# Patient Record
Sex: Female | Born: 2000 | Hispanic: No | Marital: Single | Smoking: Never smoker
Health system: Southern US, Community
[De-identification: ages and names within clinical notes are randomized; demographics above are authoritative.]

## PROBLEM LIST (undated history)

## (undated) ENCOUNTER — Inpatient Hospital Stay (HOSPITAL_COMMUNITY): Payer: Self-pay

## (undated) ENCOUNTER — Ambulatory Visit: Admission: EM | Payer: Medicaid Other

## (undated) ENCOUNTER — Emergency Department (HOSPITAL_COMMUNITY): Admission: EM | Payer: Medicaid Other | Source: Home / Self Care

## (undated) ENCOUNTER — Emergency Department (HOSPITAL_COMMUNITY): Admission: EM | Payer: Medicaid Other

## (undated) DIAGNOSIS — D649 Anemia, unspecified: Secondary | ICD-10-CM

## (undated) DIAGNOSIS — R7303 Prediabetes: Secondary | ICD-10-CM

## (undated) DIAGNOSIS — I10 Essential (primary) hypertension: Secondary | ICD-10-CM

## (undated) DIAGNOSIS — R160 Hepatomegaly, not elsewhere classified: Secondary | ICD-10-CM

## (undated) DIAGNOSIS — M069 Rheumatoid arthritis, unspecified: Secondary | ICD-10-CM

## (undated) DIAGNOSIS — N200 Calculus of kidney: Secondary | ICD-10-CM

## (undated) HISTORY — DX: Prediabetes: R73.03

## (undated) HISTORY — PX: NO PAST SURGERIES: SHX2092

---
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
## 2018-04-18 ENCOUNTER — Other Ambulatory Visit: Payer: Self-pay

## 2018-04-18 ENCOUNTER — Emergency Department
Admission: EM | Admit: 2018-04-18 | Discharge: 2018-04-18 | Disposition: A | Discharge status: Home / Self Care | Payer: Medicaid Other | Attending: Student in an Organized Health Care Education/Training Program | Admitting: Student in an Organized Health Care Education/Training Program

## 2018-04-18 DIAGNOSIS — J029 Acute pharyngitis, unspecified: Secondary | ICD-10-CM | POA: Insufficient documentation

## 2018-04-18 DIAGNOSIS — F1210 Cannabis abuse, uncomplicated: Secondary | ICD-10-CM | POA: Diagnosis not present

## 2018-04-18 DIAGNOSIS — R070 Pain in throat: Secondary | ICD-10-CM | POA: Diagnosis present

## 2018-04-18 LAB — GROUP A STREP BY PCR: GROUP A STREP BY PCR: NOT DETECTED

## 2018-04-18 MED ORDER — AMOXICILLIN 500 MG PO CAPS: 500.0000 mg | ORAL_CAPSULE | Freq: Three times a day (TID) | ORAL | 0 refills | Status: AC

## 2018-04-18 MED ORDER — AMOXICILLIN 500 MG PO CAPS
500.0000 mg | ORAL_CAPSULE | Freq: Once | ORAL | Status: AC
  Administered 2018-04-18: 500 mg via ORAL
  Filled 2018-04-18: qty 1

## 2018-04-18 MED ORDER — DEXAMETHASONE 4 MG PO TABS
10.0000 mg | ORAL_TABLET | Freq: Once | ORAL | Status: AC
  Administered 2018-04-18: 10 mg via ORAL
  Filled 2018-04-18: qty 2.5

## 2018-04-18 NOTE — ED Provider Notes (Signed)
[facility] Emergency Department Provider Note    First MD Initiated Contact with Patient 04/18/18 [phone]     (approximate)  I have reviewed the triage vital signs and the nursing notes.   HISTORY  Chief Complaint Sore Throat    HPI Bonnie Booth is a 17 y.o. female presents the ER for 3 days of sore throat.  Patient has not had any cough or measured fevers.  States it is achy sore throat and very uncomfortable when eating any food or liquids.  No sick contacts.  Denies any chest pain nausea vomiting or diarrhea.    Past Medical History:  Diagnosis Date  . Rheumatoid arthritis (HCC)   . Rheumatoid arthritis (HCC)    No family history on file. No past surgical history on file. There are no active problems to display for this patient.     Prior to Admission medications   Medication Sig Start Date End Date Taking? Authorizing Provider  albuterol (PROVENTIL HFA;VENTOLIN HFA) 108 (90 Base) MCG/ACT inhaler Inhale 1-2 puffs into the lungs every 6 (six) hours as needed for wheezing or shortness of breath (cough). Patient not taking: Reported on 10/30/2017 06/07/15   Street, [place], PA-C  amoxicillin (AMOXIL) 500 MG capsule Take 1 capsule (500 mg total) by mouth 3 (three) times daily for 5 days. 04/18/18 04/23/18  Willy Eddy, MD  doxycycline (VIBRAMYCIN) 100 MG capsule Take 1 capsule (100 mg total) by mouth 2 (two) times daily. 12/21/17   Derwood Kaplan, MD  famotidine (PEPCID) 20 MG tablet Take 1 tablet (20 mg total) by mouth 2 (two) times daily. Patient not taking: Reported on 12/21/2017 10/30/17   Jacalyn Lefevre, MD  ibuprofen (ADVIL,MOTRIN) 200 MG tablet Take 200 mg by mouth every 6 (six) hours as needed for mild pain.    [provider]  nitrofurantoin, macrocrystal-monohydrate, (MACROBID) 100 MG capsule Take 1 capsule (100 mg total) by mouth 2 (two) times daily. 12/21/17   Derwood Kaplan, MD  Spacer/Aero-Holding Chambers (AEROCHAMBER PLUS  WITH MASK) inhaler Use as instructed 06/07/15   Street, [place], PA-C    Allergies Apple; Bee venom; Cinnamon; Nurse, learning disability agent]; and Tomato    Social History Social History   Tobacco Use  . Smoking status: Never Smoker  . Smokeless tobacco: Never Used  Substance Use Topics  . Alcohol use: No  . Drug use: Yes    Types: Marijuana    Comment: 3-4 days a week    Review of Systems Patient denies headaches, rhinorrhea, blurry vision, numbness, shortness of breath, chest pain, edema, cough, abdominal pain, nausea, vomiting, diarrhea, dysuria, fevers, rashes or hallucinations unless otherwise stated above in HPI. ____________________________________________   PHYSICAL EXAM:  VITAL SIGNS: Vitals:   04/18/18 0139  BP: 119/79  Pulse: 87  Resp: 18  Temp: 98.7 F (37.1 C)  SpO2: 98%    Constitutional: Alert and oriented.  Eyes: Conjunctivae are normal.  Head: Atraumatic. Nose: No congestion/rhinnorhea. Mouth/Throat: Mucous membranes are moist.   Neck: No stridor. Painless ROM.  Cardiovascular: Normal rate, regular rhythm. Grossly normal heart sounds.  Good peripheral circulation. Respiratory: Normal respiratory effort.  No retractions. Lungs CTAB. Gastrointestinal: Soft and nontender. No distention. No abdominal bruits. No CVA tenderness. Genitourinary:  Musculoskeletal: No lower extremity tenderness nor edema.  No joint effusions. Neurologic:  Normal speech and language. No gross focal neurologic deficits are appreciated. No facial droop Skin:  Skin is warm, dry and intact. No rash noted. Psychiatric: Mood and affect are  normal. Speech and behavior are normal.  ____________________________________________   LABS (all labs ordered are listed, but only abnormal results are displayed)  Results for orders placed or performed during the hospital encounter of 04/18/18 (from the past 24 hour(s))  Group A Strep by PCR     Status: None   Collection Time: 04/18/18   1:41 AM  Result Value Ref Range   Group A Strep by PCR NOT DETECTED NOT DETECTED   ____________________________________________ ____________________________________________  RADIOLOGY  ____________________________________________   PROCEDURES  Procedure(s) performed:  Procedures    Critical Care performed: no ____________________________________________   INITIAL IMPRESSION / ASSESSMENT AND PLAN / ED COURSE  Pertinent labs & imaging results that were available during my care of the patient were reviewed by me and considered in my medical decision making (see chart for details).   DDX: pharyngitis, mono, pta,   Bonnie Booth is a 17 y.o. who presents to the ED with 3 days of sore throat. Patient is AFVSS in ED. Exam as above. Given current presentation have considered the above differential. Not consistent with PTA or RPA as no muffled voice, uvula midline, no asymmetry. Bilateral purulent exudates. No neck stiffness.  Exam consistent with acute pharyngitis.  Will give course of antibiotics as well as steroid and discussed conservative management.       As part of my medical decision making, I reviewed the following data within the electronic MEDICAL RECORD NUMBER Nursing notes reviewed and incorporated, Labs reviewed, notes from prior ED visits.   ____________________________________________   FINAL CLINICAL IMPRESSION(S) / ED DIAGNOSES  Final diagnoses:  Acute pharyngitis, unspecified etiology      NEW MEDICATIONS STARTED DURING THIS VISIT:  New Prescriptions   AMOXICILLIN (AMOXIL) 500 MG CAPSULE    Take 1 capsule (500 mg total) by mouth 3 (three) times daily for 5 days.     Note:  This document was prepared using Dragon voice recognition software and may include unintentional dictation errors.    Willy Eddy, MD 04/18/18 [phone]

## 2018-04-18 NOTE — ED Triage Notes (Signed)
Sore throat tonight.

## 2018-08-12 ENCOUNTER — Other Ambulatory Visit: Payer: Self-pay

## 2018-08-12 ENCOUNTER — Emergency Department (HOSPITAL_COMMUNITY)
Admission: EM | Admit: 2018-08-12 | Discharge: 2018-08-12 | Disposition: A | Discharge status: Home / Self Care | Payer: Medicaid Other | Attending: Emergency Medicine | Admitting: Emergency Medicine

## 2018-08-12 DIAGNOSIS — M545 Low back pain, unspecified: Secondary | ICD-10-CM

## 2018-08-12 DIAGNOSIS — R1013 Epigastric pain: Secondary | ICD-10-CM | POA: Diagnosis not present

## 2018-08-12 DIAGNOSIS — Z79899 Other long term (current) drug therapy: Secondary | ICD-10-CM | POA: Diagnosis not present

## 2018-08-12 LAB — COMPREHENSIVE METABOLIC PANEL
ALT: 30 U/L (ref 0–44)
AST: 39 U/L (ref 15–41)
Albumin: 3.8 g/dL (ref 3.5–5.0)
Alkaline Phosphatase: 86 U/L (ref 38–126)
Anion gap: 12 (ref 5–15)
BUN: 13 mg/dL (ref 6–20)
CO2: 24 mmol/L (ref 22–32)
Calcium: 8.9 mg/dL (ref 8.9–10.3)
Chloride: 103 mmol/L (ref 98–111)
Creatinine, Ser: 0.91 mg/dL (ref 0.44–1.00)
GFR calc Af Amer: >60 mL/min (ref >60)
GFR calc non Af Amer: >60 mL/min (ref >60)
Glucose, Bld: 109 mg/dL — ABNORMAL HIGH (ref 70–99)
Potassium: 3.2 mmol/L — ABNORMAL LOW (ref 3.5–5.1)
Sodium: 139 mmol/L (ref 135–145)
Total Bilirubin: 0.5 mg/dL (ref 0.3–1.2)
Total Protein: 8 g/dL (ref 6.5–8.1)

## 2018-08-12 LAB — URINALYSIS, ROUTINE W REFLEX MICROSCOPIC
Bacteria, UA: NONE SEEN
Bilirubin Urine: NEGATIVE
Glucose, UA: NEGATIVE mg/dL
Hgb urine dipstick: NEGATIVE
Ketones, ur: 5 mg/dL — AB
Leukocytes,Ua: NEGATIVE
Nitrite: NEGATIVE
Protein, ur: 30 mg/dL — AB
Specific Gravity, Urine: 1.031 — ABNORMAL HIGH (ref 1.005–1.030)
pH: 5 (ref 5.0–8.0)

## 2018-08-12 LAB — CBC WITH DIFFERENTIAL/PLATELET
Abs Immature Granulocytes: 0.01 10*3/uL (ref 0.00–0.07)
Basophils Absolute: 0 10*3/uL (ref 0.0–0.1)
Basophils Relative: 1 %
Eosinophils Absolute: 0.1 10*3/uL (ref 0.0–0.5)
Eosinophils Relative: 1 %
HCT: 41 % (ref 36.0–46.0)
Hemoglobin: 12.1 g/dL (ref 12.0–15.0)
Immature Granulocytes: 0 %
Lymphocytes Relative: 30 %
Lymphs Abs: 1.7 10*3/uL (ref 0.7–4.0)
MCH: 24.5 pg — ABNORMAL LOW (ref 26.0–34.0)
MCHC: 29.5 g/dL — ABNORMAL LOW (ref 30.0–36.0)
MCV: 83 fL (ref 80.0–100.0)
Monocytes Absolute: 0.3 10*3/uL (ref 0.1–1.0)
Monocytes Relative: 6 %
Neutro Abs: 3.5 10*3/uL (ref 1.7–7.7)
Neutrophils Relative %: 62 %
Platelets: 319 10*3/uL (ref 150–400)
RBC: 4.94 MIL/uL (ref 3.87–5.11)
RDW: 13.3 % (ref 11.5–15.5)
WBC: 5.6 10*3/uL (ref 4.0–10.5)
nRBC: 0 % (ref 0.0–0.2)

## 2018-08-12 LAB — LIPASE, BLOOD: Lipase: 30 U/L (ref 11–51)

## 2018-08-12 LAB — PREGNANCY, URINE: Preg Test, Ur: NEGATIVE

## 2018-08-12 NOTE — ED Provider Notes (Signed)
[facility] EMERGENCY DEPARTMENT Provider Note   CSN: [id number] Arrival date & time: 08/12/18  2051    History   Chief Complaint No chief complaint on file.   HPI Bonnie Booth is a 18 y.o. female who is a very poor historian.  Patient states that she and her boyfriend have been trying to get pregnant.  Lately she has been feeling "off."  She thought it may be because she is pregnant.  She took a negative pregnancy test at home 4 weeks ago.  Patient states that she has been having epigastric abdominal pain.  It seems to be worse after exercise.  No postprandial pain.  No nausea or vomiting.  She has been feeling a little bit lightheaded today.  Because of the home quarantine patient has been walking more frequently and exercising which is new for her.  She denies vaginal discharge, pain with intercourse, frequency, urgency, hematuria.  She is having some low back pain which is worse with movement, better with rest.  She denies red flag symptoms such as saddle anesthesia, bowel or bladder incontinence, weakness of the lower extremities.     HPI  Past Medical History:  Diagnosis Date   Rheumatoid arthritis (HCC)    Rheumatoid arthritis (HCC)     There are no active problems to display for this patient.   No past surgical history on file.   OB History   No obstetric history on file.      Home Medications    Prior to Admission medications   Medication Sig Start Date End Date Taking? Authorizing Provider  albuterol (PROVENTIL HFA;VENTOLIN HFA) 108 (90 Base) MCG/ACT inhaler Inhale 1-2 puffs into the lungs every 6 (six) hours as needed for wheezing or shortness of breath (cough). Patient not taking: Reported on 10/30/2017 06/07/15   Street, [place], PA-C  doxycycline (VIBRAMYCIN) 100 MG capsule Take 1 capsule (100 mg total) by mouth 2 (two) times daily. 12/21/17   Derwood Kaplan, MD  famotidine (PEPCID) 20 MG tablet Take 1 tablet (20 mg total) by mouth 2 (two)  times daily. Patient not taking: Reported on 12/21/2017 10/30/17   Jacalyn Lefevre, MD  ibuprofen (ADVIL,MOTRIN) 200 MG tablet Take 200 mg by mouth every 6 (six) hours as needed for mild pain.    [provider]  nitrofurantoin, macrocrystal-monohydrate, (MACROBID) 100 MG capsule Take 1 capsule (100 mg total) by mouth 2 (two) times daily. 12/21/17   Derwood Kaplan, MD  Spacer/Aero-Holding Chambers (AEROCHAMBER PLUS WITH MASK) inhaler Use as instructed 06/07/15   Street, [place], [place]    Family History No family history on file.  Social History Social History   Tobacco Use   Smoking status: Never Smoker   Smokeless tobacco: Never Used  Substance Use Topics   Alcohol use: No   Drug use: Yes    Types: Marijuana    Comment: 3-4 days a week     Allergies   Apple; Bee venom; Cinnamon; Mango flavor [flavoring agent]; and Tomato   Review of Systems Review of Systems   Ten systems reviewed and are negative for acute change, except as noted in the HPI.   Physical Exam Updated Vital Signs BP 125/74 (BP Location: Left Arm)    Pulse 87    Temp 98.3 F (36.8 C) (Oral)    Resp 18    SpO2 100%   Physical Exam Vitals signs and nursing note reviewed.  Constitutional:      General: She is not in acute  distress.    Appearance: She is well-developed. She is not diaphoretic.  HENT:     Head: Normocephalic and atraumatic.  Eyes:     General: No scleral icterus.    Conjunctiva/sclera: Conjunctivae normal.  Neck:     Musculoskeletal: Normal range of motion.  Cardiovascular:     Rate and Rhythm: Normal rate and regular rhythm.     Heart sounds: Normal heart sounds. No murmur. No friction rub. No gallop.   Pulmonary:     Effort: Pulmonary effort is normal. No respiratory distress.     Breath sounds: Normal breath sounds.  Abdominal:     General: Bowel sounds are normal. There is no distension.     Palpations: Abdomen is soft. There is no mass.     Tenderness: There is no  abdominal tenderness. There is no right CVA tenderness, left CVA tenderness or guarding.  Musculoskeletal:     Comments: No midline spinal tenderness.  Pain with twisting, flexion extension of the lumbar spine.  Patient is full.  Normal strength in lower extremities.  Skin:    General: Skin is warm and dry.  Neurological:     Mental Status: She is alert and oriented to person, place, and time.  Psychiatric:        Behavior: Behavior normal.      ED Treatments / Results  Labs (all labs ordered are listed, but only abnormal results are displayed) Labs Reviewed - No data to display  EKG None  Radiology No results found.  Procedures Procedures (including critical care time)  Medications Ordered in ED Medications - No data to display   Initial Impression / Assessment and Plan / ED Course  I have reviewed the triage vital signs and the nursing notes.  Pertinent labs & imaging results that were available during my care of the patient were reviewed by me and considered in my medical decision making (see chart for details).      Patient with vague abdominal symptoms and lightheadedness.  Mild hypokalemia on review of labs.  The elevated glucose.  Urine without signs of infection.  Patient is not pregnant.  Patient is advised to follow-up with her primary care physician.  She appears appropriate for discharge at this time Patient with back pain.  No neurological deficits and normal neuro exam.  Patient can walk but states is painful.  No loss of bowel or bladder control.  No concern for cauda equina.  No fever, night sweats, weight loss, h/o cancer, IVDU.  RICE protocol and pain medicine indicated and discussed with patient.      Final Clinical Impressions(s) / ED Diagnoses   Final diagnoses:  None    ED Discharge Orders    None       Arthor Captain, PA-C 08/12/18 2300    Wynetta Fines, MD 08/12/18 2304

## 2018-08-12 NOTE — Discharge Instructions (Addendum)
You are not pregnant. Your labs are normal Use tylenol or ibuprofen as directed on the bottle for pain. Start taking a prenatal vitamin if you are trying to get pregnant. SEEK IMMEDIATE MEDICAL ATTENTION IF: New numbness, tingling, weakness, or problem with the use of your arms or legs.  Severe back pain not relieved with medications.  Change in bowel or bladder control.  Increasing pain in any areas of the body (such as chest or abdominal pain).  Shortness of breath, dizziness or fainting.  Nausea (feeling sick to your stomach), vomiting, fever, or sweats. Abdominal (belly) pain can be caused by many things. Your caregiver performed an examination and possibly ordered blood/urine tests and imaging (CT scan, x-rays, ultrasound). Many cases can be observed and treated at home after initial evaluation in the emergency department. Even though you are being discharged home, abdominal pain can be unpredictable. Therefore, you need a repeated exam if your pain does not resolve, returns, or worsens. Most patients with abdominal pain don't have to be admitted to the hospital or have surgery, but serious problems like appendicitis and gallbladder attacks can start out as nonspecific pain. Many abdominal conditions cannot be diagnosed in one visit, so follow-up evaluations are very important. SEEK IMMEDIATE MEDICAL ATTENTION IF: The pain does not go away or becomes severe.  A temperature above 101 develops.  Repeated vomiting occurs (multiple episodes).  The pain becomes localized to portions of the abdomen. The right side could possibly be appendicitis. In an adult, the left lower portion of the abdomen could be colitis or diverticulitis.  Blood is being passed in stools or vomit (bright red or black tarry stools).  Return also if you develop chest pain, difficulty breathing, dizziness or fainting, or become confused, poorly responsive, or inconsolable (young children).

## 2018-08-12 NOTE — ED Notes (Signed)
Pt alert and oriented x4. Skin warm and dry. Respirations equal and unlabored. s1 and s2 heart sounds audible. Pt states last period was exactly a month ago. Denies n/v/. Denies any vaginal discharge.

## 2018-08-12 NOTE — ED Triage Notes (Signed)
Pt states has abdominal pain and believes she is pregnant. Last menstrual was last month.

## 2018-09-20 ENCOUNTER — Other Ambulatory Visit: Payer: Self-pay

## 2018-09-20 ENCOUNTER — Emergency Department (HOSPITAL_COMMUNITY): Payer: Medicaid Other

## 2018-09-20 ENCOUNTER — Emergency Department (HOSPITAL_COMMUNITY)
Admission: EM | Admit: 2018-09-20 | Discharge: 2018-09-20 | Disposition: A | Discharge status: Home / Self Care | Payer: Medicaid Other | Attending: Emergency Medicine | Admitting: Emergency Medicine

## 2018-09-20 ENCOUNTER — Encounter (HOSPITAL_COMMUNITY): Payer: Self-pay | Admitting: Emergency Medicine

## 2018-09-20 DIAGNOSIS — Z3A01 Less than 8 weeks gestation of pregnancy: Secondary | ICD-10-CM | POA: Diagnosis not present

## 2018-09-20 DIAGNOSIS — O26899 Other specified pregnancy related conditions, unspecified trimester: Secondary | ICD-10-CM

## 2018-09-20 DIAGNOSIS — R102 Pelvic and perineal pain: Secondary | ICD-10-CM | POA: Diagnosis not present

## 2018-09-20 DIAGNOSIS — O26891 Other specified pregnancy related conditions, first trimester: Secondary | ICD-10-CM | POA: Diagnosis present

## 2018-09-20 LAB — COMPREHENSIVE METABOLIC PANEL
ALT: 26 U/L (ref 0–44)
AST: 24 U/L (ref 15–41)
Albumin: 3.6 g/dL (ref 3.5–5.0)
Alkaline Phosphatase: 90 U/L (ref 38–126)
Anion gap: 6 (ref 5–15)
BUN: 15 mg/dL (ref 6–20)
CO2: 27 mmol/L (ref 22–32)
Calcium: 8.7 mg/dL — ABNORMAL LOW (ref 8.9–10.3)
Chloride: 105 mmol/L (ref 98–111)
Creatinine, Ser: 0.82 mg/dL (ref 0.44–1.00)
GFR calc Af Amer: >60 mL/min (ref >60)
GFR calc non Af Amer: >60 mL/min (ref >60)
Glucose, Bld: 90 mg/dL (ref 70–99)
Potassium: 3.7 mmol/L (ref 3.5–5.1)
Sodium: 138 mmol/L (ref 135–145)
Total Bilirubin: 0.3 mg/dL (ref 0.3–1.2)
Total Protein: 7.4 g/dL (ref 6.5–8.1)

## 2018-09-20 LAB — URINALYSIS, ROUTINE W REFLEX MICROSCOPIC
Bacteria, UA: NONE SEEN
Bilirubin Urine: NEGATIVE
Glucose, UA: NEGATIVE mg/dL
Hgb urine dipstick: NEGATIVE
Ketones, ur: NEGATIVE mg/dL
Nitrite: NEGATIVE
Protein, ur: NEGATIVE mg/dL
Specific Gravity, Urine: 1.021 (ref 1.005–1.030)
pH: 6 (ref 5.0–8.0)

## 2018-09-20 LAB — CBC
HCT: 38.7 % (ref 36.0–46.0)
Hemoglobin: 11.8 g/dL — ABNORMAL LOW (ref 12.0–15.0)
MCH: 25.5 pg — ABNORMAL LOW (ref 26.0–34.0)
MCHC: 30.5 g/dL (ref 30.0–36.0)
MCV: 83.8 fL (ref 80.0–100.0)
Platelets: 320 10*3/uL (ref 150–400)
RBC: 4.62 MIL/uL (ref 3.87–5.11)
RDW: 14.1 % (ref 11.5–15.5)
WBC: 8.6 10*3/uL (ref 4.0–10.5)
nRBC: 0 % (ref 0.0–0.2)

## 2018-09-20 LAB — LIPASE, BLOOD: Lipase: 27 U/L (ref 11–51)

## 2018-09-20 LAB — I-STAT BETA HCG BLOOD, ED (MC, WL, AP ONLY): I-stat hCG, quantitative: 1396.1 m[IU]/mL — ABNORMAL HIGH (ref <5)

## 2018-09-20 LAB — ABO/RH: ABO/RH(D): O POS

## 2018-09-20 MED ORDER — SODIUM CHLORIDE 0.9 % IV BOLUS
1000.0000 mL | Freq: Once | INTRAVENOUS | Status: AC
  Administered 2018-09-20: 1000 mL via INTRAVENOUS

## 2018-09-20 NOTE — ED Provider Notes (Signed)
[facility]-EMERGENCY DEPT Provider Note   CSN: [id number] Arrival date & time: 09/20/18  1407    History   Chief Complaint Chief Complaint  Patient presents with  . Abdominal Pain    HPI Bonnie Booth is a 18 y.o. female 583P0, 5718w3d by lmp  with a past medical history of rheumatoid arthritis who presents today for evaluation of midline pelvic pain.  She reports that she has taken a pregnancy test at home that was positive.  She is having intermittent episodes of under 1 minute of midline lower pelvic pain.  She denies any vaginal spotting or bleeding.  No vaginal discharge.  She denies any concern for sexually transmitted infections.    No fevers.  No urinary symptoms.      HPI  Past Medical History:  Diagnosis Date  . Rheumatoid arthritis (HCC)   . Rheumatoid arthritis (HCC)     There are no active problems to display for this patient.   History reviewed. No pertinent surgical history.   OB History    Gravida  3   Para  0   Term      Preterm      AB  2   Living        SAB  2   TAB      Ectopic      Multiple      Live Births               Home Medications    Prior to Admission medications   Medication Sig Start Date End Date Taking? Authorizing Provider  albuterol (PROVENTIL HFA;VENTOLIN HFA) 108 (90 Base) MCG/ACT inhaler Inhale 1-2 puffs into the lungs every 6 (six) hours as needed for wheezing or shortness of breath (cough). 06/07/15   Street, [place], PA-C  doxycycline (VIBRAMYCIN) 100 MG capsule Take 1 capsule (100 mg total) by mouth 2 (two) times daily. Patient not taking: Reported on 08/12/2018 12/21/17   Derwood KaplanNanavati, Ankit, MD  EPINEPHrine 0.3 mg/0.3 mL IJ SOAJ injection Inject 0.3 mg into the muscle once as needed for anaphylaxis.  02/07/18   [provider]  famotidine (PEPCID) 20 MG tablet Take 1 tablet (20 mg total) by mouth 2 (two) times daily. Patient not taking: Reported on 08/12/2018 10/30/17   Jacalyn LefevreHaviland,  Julie, MD  ibuprofen (ADVIL,MOTRIN) 200 MG tablet Take 200-400 mg by mouth every 6 (six) hours as needed (for pain or headaches).     [provider]  nitrofurantoin, macrocrystal-monohydrate, (MACROBID) 100 MG capsule Take 1 capsule (100 mg total) by mouth 2 (two) times daily. Patient not taking: Reported on 08/12/2018 12/21/17   Derwood KaplanNanavati, Ankit, MD  Spacer/Aero-Holding Chambers (AEROCHAMBER PLUS WITH MASK) inhaler Use as instructed 06/07/15   Street, [place]cedes, [place]-C    Family History No family history on file.  Social History Social History   Tobacco Use  . Smoking status: Never Smoker  . Smokeless tobacco: Never Used  Substance Use Topics  . Alcohol use: No  . Drug use: Yes    Types: Marijuana    Comment: 3-4 days a week     Allergies   Apple; Bee venom; Cinnamon; Mango flavor [flavoring agent]; Peach [prunus persica]; Pineapple; and Tomato   Review of Systems Review of Systems  Constitutional: Negative for chills and fever.  Respiratory: Negative for cough and shortness of breath.   Cardiovascular: Negative for chest pain.  Gastrointestinal: Negative for diarrhea, nausea and vomiting.  Genitourinary: Positive for pelvic pain. Negative  for dysuria, flank pain, hematuria and urgency.  Musculoskeletal: Negative for back pain and neck pain.  Neurological: Negative for weakness and headaches.  All other systems reviewed and are negative.    Physical Exam Updated Vital Signs BP 98/87 (BP Location: Left Arm)   Pulse (!) 106   Temp 99.2 F (37.3 C) (Oral)   Resp 17   LMP 08/20/2018   SpO2 100%   Physical Exam Vitals signs and nursing note reviewed.  Constitutional:      General: She is not in acute distress.    Appearance: She is well-developed. She is not diaphoretic.  HENT:     Head: Normocephalic and atraumatic.  Eyes:     General: No scleral icterus.       Right eye: No discharge.        Left eye: No discharge.     Conjunctiva/sclera: Conjunctivae  normal.  Neck:     Musculoskeletal: Normal range of motion.  Cardiovascular:     Rate and Rhythm: Normal rate and regular rhythm.     Heart sounds: Normal heart sounds.  Pulmonary:     Effort: Pulmonary effort is normal. No respiratory distress.     Breath sounds: Normal breath sounds. No stridor.  Abdominal:     General: There is no distension.     Tenderness: There is no abdominal tenderness.  Genitourinary:    Comments: Patient refused.  Musculoskeletal:        General: No deformity.  Skin:    General: Skin is warm and dry.  Neurological:     General: No focal deficit present.     Mental Status: She is alert.     Motor: No abnormal muscle tone.  Psychiatric:        Mood and Affect: Mood normal.        Behavior: Behavior normal.       ED Treatments / Results  Labs (all labs ordered are listed, but only abnormal results are displayed) Labs Reviewed  COMPREHENSIVE METABOLIC PANEL - Abnormal; Notable for the following components:      Result Value   Calcium 8.7 (*)    All other components within normal limits  CBC - Abnormal; Notable for the following components:   Hemoglobin 11.8 (*)    MCH 25.5 (*)    All other components within normal limits  URINALYSIS, ROUTINE W REFLEX MICROSCOPIC - Abnormal; Notable for the following components:   Leukocytes,Ua TRACE (*)    All other components within normal limits  I-STAT BETA HCG BLOOD, ED ([facility], [facility], [facility] ONLY) - Abnormal; Notable for the following components:   I-stat hCG, quantitative 1,396.1 (*)    All other components within normal limits  LIPASE, BLOOD  ABO/RH    EKG None  Radiology US Ob Comp < 14 Wks  Result Date: 09/20/2018 CLINICAL DATA:  Pelvic pain with positive beta HCG EXAM: OBSTETRIC <14 WK [place] AND TRANSVAGINAL OB US TECHNIQUE: Both transabdominal and transvaginal ultrasound examinations were performed for complete evaluation of the gestation as well as the maternal uterus, adnexal regions, and pelvic  cul-de-sac. Transvaginal technique was performed to assess early pregnancy. COMPARISON:  None. FINDINGS: Intrauterine gestational sac: Visualized Yolk sac:  Not visualized Embryo:  Not visualized Cardiac Activity: Not visualized MSD: 4 mm   5 w   1 d Subchorionic hemorrhage:  None visualized. Maternal uterus/adnexae: Cervical os is closed. Right ovary measures 4.1 x 2.4 x 2.2 cm. Left ovary measures 2.4 x 1.5 x 2.5 cm.  No free fluid evident. IMPRESSION: Probable early intrauterine gestational sac, but no yolk sac, fetal pole, or cardiac activity yet visualized. Recommend follow-up quantitative B-HCG levels and follow-up US in 14 days to assess viability. This recommendation follows SRU consensus guidelines: Diagnostic Criteria for Nonviable Pregnancy Early in the First Trimester. Malva Limes Engl J Med 2013; 161:0960-45; 369:1443-51. Based on apparent gestational sac size, estimated gestational age is 5 weeks. Study otherwise unremarkable. Electronically Signed   By: Bretta BangWilliam  Woodruff III M.D.   On: 09/20/2018 18:03   [place] Ob Transvaginal  Result Date: 09/20/2018 CLINICAL DATA:  Pelvic pain with positive beta HCG EXAM: OBSTETRIC <14 WK US AND TRANSVAGINAL OB US TECHNIQUE: Both transabdominal and transvaginal ultrasound examinations were performed for complete evaluation of the gestation as well as the maternal uterus, adnexal regions, and pelvic cul-de-sac. Transvaginal technique was performed to assess early pregnancy. COMPARISON:  None. FINDINGS: Intrauterine gestational sac: Visualized Yolk sac:  Not visualized Embryo:  Not visualized Cardiac Activity: Not visualized MSD: 4 mm   5 w   1 d Subchorionic hemorrhage:  None visualized. Maternal uterus/adnexae: Cervical os is closed. Right ovary measures 4.1 x 2.4 x 2.2 cm. Left ovary measures 2.4 x 1.5 x 2.5 cm. No free fluid evident. IMPRESSION: Probable early intrauterine gestational sac, but no yolk sac, fetal pole, or cardiac activity yet visualized. Recommend follow-up quantitative  B-HCG levels and follow-up US in 14 days to assess viability. This recommendation follows SRU consensus guidelines: Diagnostic Criteria for Nonviable Pregnancy Early in the First Trimester. Malva Limes Engl J Med 2013; 409:8119-14; 369:1443-51. Based on apparent gestational sac size, estimated gestational age is 5 weeks. Study otherwise unremarkable. Electronically Signed   By: Bretta BangWilliam  Woodruff III M.D.   On: 09/20/2018 18:03    Procedures Procedures (including critical care time)  Medications Ordered in ED Medications  sodium chloride 0.9 % bolus 1,000 mL (0 mLs Intravenous Stopped 09/20/18 1759)     Initial Impression / Assessment and Plan / ED Course  I have reviewed the triage vital signs and the nursing notes.  Pertinent labs & imaging results that were available during my care of the patient were reviewed by me and considered in my medical decision making (see chart for details).       Presents today for evaluation of intermittent pain during pregnancy.  She recently found out that she was pregnant, however is not had any imaging yet for prenatal appointments.  Her urine did not have clear evidence of infection and she is not having symptoms.  hCG was elevated at 1300.  Blood type is O+.  She is slightly anemic with a hemoglobin of 11.8, however does not have other significant electrolyte or hematologic abnormalities.  Based on her intermittent pain with unknown location of pregnancy ultrasound was obtained showing a probable intrauterine gestation sac without visualized fetus her heart rate, suspect that this is too early for fetal visualization.  Recommended follow-up with OB in 2 days for repeat hCG and evaluation.  Recommended that if she need additional emergency medical care for her pregnancy that [facility].  Return precautions were discussed with patient who states their understanding.  At the time of discharge patient denied any unaddressed complaints or concerns.  Patient is  agreeable for discharge home.   Final Clinical Impressions(s) / ED Diagnoses   Final diagnoses:  Pelvic pain during pregnancy    ED Discharge Orders    None       Cristina GongHammond, Malaya Cagley W, [place]-C 09/20/18  2300    Rolan BuccoBelfi, Melanie, MD 09/20/18 [phone]

## 2018-09-20 NOTE — ED Triage Notes (Signed)
Pt reports she recently found out she was pregnant but unsure how far along she is. C/o lower abd pains that are intermittent over the past couple days.

## 2018-09-20 NOTE — Discharge Instructions (Signed)
Please start taking a prenatal vitamin.  Please do not drink any alcohol.  Please schedule a follow-up appointment with OB/GYN.  I would recommend that you get a repeat evaluation in 2 days.

## 2018-09-20 NOTE — ED Notes (Signed)
US at bedside

## 2018-10-11 ENCOUNTER — Telehealth (INDEPENDENT_AMBULATORY_CARE_PROVIDER_SITE_OTHER): Payer: Self-pay | Admitting: Family Medicine

## 2018-10-11 DIAGNOSIS — Z3201 Encounter for pregnancy test, result positive: Secondary | ICD-10-CM

## 2018-10-11 NOTE — Telephone Encounter (Signed)
Patient called in, she was told back in May to follow up with a OBGYN for repeat hCG and evaluation, patient state she did not do that and want to know if she still need that done.

## 2018-10-12 ENCOUNTER — Telehealth: Payer: Self-pay | Admitting: Family Medicine

## 2018-10-12 NOTE — Telephone Encounter (Signed)
Called and spoke with pt. She states she feels pretty good. Pt states she is taking Prenatal Gummies.   EDD 05/22/19 based on [place] on 09/20/2018 with Gestation of 5 weeks 1 day.   Pt reports some mild pelvic pain that is intermittent. She reports the pain is not severe and she is not having bleeding.   Discussed with Pt that the front office will be calling her to set up her OB intake with a nurse and then an appt with a provider. Provider will determine if more labwork or follow up US is needed. Pt voiced understanding.

## 2018-10-12 NOTE — Telephone Encounter (Signed)
Patient was called and instructed about her visit for 10/13/2018. MyChart app was downloaded.

## 2018-10-13 ENCOUNTER — Telehealth (INDEPENDENT_AMBULATORY_CARE_PROVIDER_SITE_OTHER): Payer: Medicaid Other | Admitting: *Deleted

## 2018-10-13 ENCOUNTER — Other Ambulatory Visit: Payer: Self-pay

## 2018-10-13 DIAGNOSIS — O3680X0 Pregnancy with inconclusive fetal viability, not applicable or unspecified: Secondary | ICD-10-CM

## 2018-10-13 NOTE — Progress Notes (Signed)
I connected with  Bonnie Booth on 10/13/18 at 10:15 AM EDT by telephone and verified that I am speaking with the correct person using two identifiers.   I discussed the limitations, risks, security and privacy concerns of performing an evaluation and management service by telephone and virtually and the availability of in person appointments. I also discussed with the patient that there may be a patient responsible charge related to this service. The patient expressed understanding and agreed to proceed.  Per review of chart found that patient has ED visit  09/20/18 and did not have confirmed viable pregnancy, having pelvic pain. Discussed with Dr.Davis and plan she needs to have another viability [place] and then if viable, reschedule new ob intake.   I called [name] and infomed her of new plan. She verifies she has not had any bleeding or severe pain since ED visit. I informed her we have scheduled her for [place] 10/20/18 and then she will come to  office for results. We also discussed if viable - will reschedule new ob intake. I also advised her to go to [facility] if she has severe pain or heavy bleeding. She voices understanding. Linda,RN 10/13/2018  10:36 AM

## 2018-10-20 ENCOUNTER — Ambulatory Visit (HOSPITAL_COMMUNITY)
Admission: RE | Admit: 2018-10-20 | Discharge: 2018-10-20 | Disposition: A | Discharge status: Home / Self Care | Payer: Medicaid Other | Source: Ambulatory Visit | Attending: Obstetrics and Gynecology | Admitting: Obstetrics and Gynecology

## 2018-10-20 ENCOUNTER — Other Ambulatory Visit: Payer: Self-pay

## 2018-10-20 ENCOUNTER — Ambulatory Visit (INDEPENDENT_AMBULATORY_CARE_PROVIDER_SITE_OTHER): Payer: Medicaid Other | Admitting: *Deleted

## 2018-10-20 VITALS — BP 116/67 | HR 90 | Ht 66.0 in | Wt 208.8 lb

## 2018-10-20 DIAGNOSIS — O3680X0 Pregnancy with inconclusive fetal viability, not applicable or unspecified: Secondary | ICD-10-CM

## 2018-10-20 DIAGNOSIS — Z712 Person consulting for explanation of examination or test findings: Secondary | ICD-10-CM

## 2018-10-20 NOTE — Progress Notes (Signed)
Pt presents to clinic for ultrasound results.  Pt had ultrasound performed today d/t pelvic pain and unconfirmed pregnancy.   Pt states pelvic pain has resolved. Reviewed results with Dr. Harolyn Rutherford, pt has viable [redacted]w[redacted]d preganncy.  Pt states she is already taking OTC gummy prenatal vitamins and she has a prenatal appointment set up at this clinic on 11/04/18.  Ultrasound dating entered for pt's EDD.  Pt will be [redacted]w[redacted]d at her intial prenatal based off of the ultrasound.

## 2018-10-20 NOTE — Progress Notes (Signed)
I have reviewed the chart and agree with nursing staff's documentation of this patient's encounter.  Verita Schneiders, MD 10/20/2018 9:43 AM

## 2018-11-03 ENCOUNTER — Telehealth: Payer: Self-pay | Admitting: Obstetrics & Gynecology

## 2018-11-03 NOTE — Telephone Encounter (Signed)
Called the patient to inform of upcoming appointment. Left a detailed voicemail of the location, appointment time and date. Also informed the patient if they’ve been in close contact with someone or diagnosed with Covid19 in the previous 14-day period please call to reschedule. If the patient has also experience flu-like symptoms such as sore throat, fever, and/or shortness of breath. Please call our office to reschedule. In addition, please be sure to wear a face mask to the visit and sanitize hands upon entering the office. Also, no children or visitors or allowed due to Covid19 restrictions. If you have any questions or concerns, please contact our office. °

## 2018-11-04 ENCOUNTER — Encounter: Payer: Self-pay | Admitting: Internal Medicine

## 2018-11-04 ENCOUNTER — Ambulatory Visit (INDEPENDENT_AMBULATORY_CARE_PROVIDER_SITE_OTHER): Payer: Medicaid Other | Admitting: Internal Medicine

## 2018-11-04 ENCOUNTER — Other Ambulatory Visit (HOSPITAL_COMMUNITY)
Admission: RE | Admit: 2018-11-04 | Discharge: 2018-11-04 | Disposition: A | Discharge status: Home / Self Care | Payer: Medicaid Other | Source: Ambulatory Visit | Attending: Internal Medicine | Admitting: Internal Medicine

## 2018-11-04 ENCOUNTER — Other Ambulatory Visit: Payer: Self-pay

## 2018-11-04 VITALS — BP 122/74 | HR 97 | Wt 209.0 lb

## 2018-11-04 DIAGNOSIS — N898 Other specified noninflammatory disorders of vagina: Secondary | ICD-10-CM | POA: Diagnosis not present

## 2018-11-04 DIAGNOSIS — Z3480 Encounter for supervision of other normal pregnancy, unspecified trimester: Secondary | ICD-10-CM

## 2018-11-04 DIAGNOSIS — O26891 Other specified pregnancy related conditions, first trimester: Secondary | ICD-10-CM

## 2018-11-04 DIAGNOSIS — O99210 Obesity complicating pregnancy, unspecified trimester: Secondary | ICD-10-CM

## 2018-11-04 DIAGNOSIS — O99211 Obesity complicating pregnancy, first trimester: Secondary | ICD-10-CM

## 2018-11-04 DIAGNOSIS — Z3A10 10 weeks gestation of pregnancy: Secondary | ICD-10-CM

## 2018-11-04 HISTORY — DX: Encounter for supervision of other normal pregnancy, unspecified trimester: Z34.80

## 2018-11-04 MED ORDER — PRENATAL ADULT GUMMY/DHA/FA 0.4-25 MG PO CHEW: 1.0000 | CHEWABLE_TABLET | Freq: Every day | ORAL | 11 refills | Status: DC

## 2018-11-04 NOTE — Patient Instructions (Signed)
First Trimester of Pregnancy ° °The first trimester of pregnancy is from week 1 until the end of week 13 (months 1 through 3). During this time, your baby will begin to develop inside you. At 6-8 weeks, the eyes and face are formed, and the heartbeat can be seen on ultrasound. At the end of 12 weeks, all the baby's organs are formed. Prenatal care is all the medical care you receive before the birth of your baby. Make sure you get good prenatal care and follow all of your doctor's instructions. °Follow these instructions at home: °Medicines °· Take over-the-counter and prescription medicines only as told by your doctor. Some medicines are safe and some medicines are not safe during pregnancy. °· Take a prenatal vitamin that contains at least 600 micrograms (mcg) of folic acid. °· If you have trouble pooping (constipation), take medicine that will make your stool soft (stool softener) if your doctor approves. °Eating and drinking ° °· Eat regular, healthy meals. °· Your doctor will tell you the amount of weight gain that is right for you. °· Avoid raw meat and uncooked cheese. °· If you feel sick to your stomach (nauseous) or throw up (vomit): °? Eat 4 or 5 small meals a day instead of 3 large meals. °? Try eating a few soda crackers. °? Drink liquids between meals instead of during meals. °· To prevent constipation: °? Eat foods that are high in fiber, like fresh fruits and vegetables, whole grains, and beans. °? Drink enough fluids to keep your pee (urine) clear or pale yellow. °Activity °· Exercise only as told by your doctor. Stop exercising if you have cramps or pain in your lower belly (abdomen) or low back. °· Do not exercise if it is too hot, too humid, or if you are in a place of great height (high altitude). °· Try to avoid standing for long periods of time. Move your legs often if you must stand in one place for a long time. °· Avoid heavy lifting. °· Wear low-heeled shoes. Sit and stand up  straight. °· You can have sex unless your doctor tells you not to. °Relieving pain and discomfort °· Wear a good support bra if your breasts are sore. °· Take warm water baths (sitz baths) to soothe pain or discomfort caused by hemorrhoids. Use hemorrhoid cream if your doctor says it is okay. °· Rest with your legs raised if you have leg cramps or low back pain. °· If you have puffy, bulging veins (varicose veins) in your legs: °? Wear support hose or compression stockings as told by your doctor. °? Raise (elevate) your feet for 15 minutes, 3-4 times a day. °? Limit salt in your food. °Prenatal care °· Schedule your prenatal visits by the twelfth week of pregnancy. °· Write down your questions. Take them to your prenatal visits. °· Keep all your prenatal visits as told by your doctor. This is important. °Safety °· Wear your seat belt at all times when driving. °· Make a list of emergency phone numbers. The list should include numbers for family, friends, the hospital, and police and fire departments. °General instructions °· Ask your doctor for a referral to a local prenatal class. Begin classes no later than at the start of month 6 of your pregnancy. °· Ask for help if you need counseling or if you need help with nutrition. Your doctor can give you advice or tell you where to go for help. °· Do not use hot tubs, steam   rooms, or saunas. °· Do not douche or use tampons or scented sanitary pads. °· Do not cross your legs for long periods of time. °· Avoid all herbs and alcohol. Avoid drugs that are not approved by your doctor. °· Do not use any tobacco products, including cigarettes, chewing tobacco, and electronic cigarettes. If you need help quitting, ask your doctor. You may get counseling or other support to help you quit. °· Avoid cat litter boxes and soil used by cats. These carry germs that can cause birth defects in the baby and can cause a loss of your baby (miscarriage) or stillbirth. °· Visit your dentist.  At home, brush your teeth with a soft toothbrush. Be gentle when you floss. °Contact a doctor if: °· You are dizzy. °· You have mild cramps or pressure in your lower belly. °· You have a nagging pain in your belly area. °· You continue to feel sick to your stomach, you throw up, or you have watery poop (diarrhea). °· You have a bad smelling fluid coming from your vagina. °· You have pain when you pee (urinate). °· You have increased puffiness (swelling) in your face, hands, legs, or ankles. °Get help right away if: °· You have a fever. °· You are leaking fluid from your vagina. °· You have spotting or bleeding from your vagina. °· You have very bad belly cramping or pain. °· You gain or lose weight rapidly. °· You throw up blood. It may look like coffee grounds. °· You are around people who have German measles, fifth disease, or chickenpox. °· You have a very bad headache. °· You have shortness of breath. °· You have any kind of trauma, such as from a fall or a car accident. °Summary °· The first trimester of pregnancy is from week 1 until the end of week 13 (months 1 through 3). °· To take care of yourself and your unborn baby, you will need to eat healthy meals, take medicines only if your doctor tells you to do so, and do activities that are safe for you and your baby. °· Keep all follow-up visits as told by your doctor. This is important as your doctor will have to ensure that your baby is healthy and growing well. °This information is not intended to replace advice given to you by your health care provider. Make sure you discuss any questions you have with your health care provider. °Document Released: 10/01/2007 Document Revised: 08/05/2018 Document Reviewed: 04/22/2016 °Elsevier Patient Education © 2020 [employer] ° °

## 2018-11-04 NOTE — Progress Notes (Signed)
Subjective:   Bonnie Booth is a 18 y.o. G3P0020 at [redacted]w[redacted]d by LMP c/w early ultrasound being seen today for her first obstetrical visit.  Her obstetrical history is significant for obesity. Patient does intend to breast feed. Pregnancy history fully reviewed.  Patient reports vaginal discharge. Concerned she may have yeast infection. Reports excessive amounts of white mily discharge. Marland Kitchen  HISTORY: OB History  Gravida Para Term Preterm AB Living  3 0 0 0 2 0  SAB TAB Ectopic Multiple Live Births  2 0 0 0 0    # Outcome Date GA Lbr Len/2nd Weight Sex Delivery Anes PTL Lv  3 Current           2 SAB           1 SAB            Past Medical History:  Diagnosis Date  . Rheumatoid arthritis (HCC)   . Rheumatoid arthritis [facility])    Past Surgical History:  Procedure Laterality Date  . NO PAST SURGERIES     History reviewed. No pertinent family history. Social History   Tobacco Use  . Smoking status: Never Smoker  . Smokeless tobacco: Never Used  Substance Use Topics  . Alcohol use: No  . Drug use: Yes    Types: Marijuana    Comment: 3-4 days a week   Allergies  Allergen Reactions  . Apple Anaphylaxis and Swelling    Patient's throat swells  . Bee Venom Anaphylaxis and Swelling    Patient's throat swells  . Cinnamon Anaphylaxis and Swelling    Patient's throat swells  . Mango Flavor [Flavoring Agent] Anaphylaxis and Swelling    Patient's throat swells  . Peach [Prunus Persica] Anaphylaxis and Swelling  . Pineapple Anaphylaxis and Swelling    Patient's throat swells  . Tomato Nausea And Vomiting   Current Outpatient Medications on File Prior to Visit  Medication Sig Dispense Refill  . albuterol (PROVENTIL HFA;VENTOLIN HFA) 108 (90 Base) MCG/ACT inhaler Inhale 1-2 puffs into the lungs every 6 (six) hours as needed for wheezing or shortness of breath (cough). 1 Inhaler 0  . doxycycline (VIBRAMYCIN) 100 MG capsule Take 1 capsule (100 mg total) by mouth 2 (two) times  daily. 28 capsule 0  . EPINEPHrine 0.3 mg/0.3 mL IJ SOAJ injection Inject 0.3 mg into the muscle once as needed for anaphylaxis.     . Prenatal Vit-Fe Fumarate-FA (PRENATAL MULTIVITAMIN) TABS tablet Take 1 tablet by mouth daily at 12 noon.    Marland Kitchen Spacer/Aero-Holding Chambers (AEROCHAMBER PLUS WITH MASK) inhaler Use as instructed 1 each 0  . famotidine (PEPCID) 20 MG tablet Take 1 tablet (20 mg total) by mouth 2 (two) times daily. (Patient not taking: Reported on 08/12/2018) 30 tablet 0  . ibuprofen (ADVIL,MOTRIN) 200 MG tablet Take 200-400 mg by mouth every 6 (six) hours as needed (for pain or headaches).     . nitrofurantoin, macrocrystal-monohydrate, (MACROBID) 100 MG capsule Take 1 capsule (100 mg total) by mouth 2 (two) times daily. (Patient not taking: Reported on 08/12/2018) 10 capsule 0   No current facility-administered medications on file prior to visit.     Indications for ASA therapy (per uptodate) One of the following: Previous pregnancy with preeclampsia, especially early onset and with an adverse outcome No Multifetal gestation No Chronic hypertension No Type 1 or 2 diabetes mellitus No Chronic kidney disease No Autoimmune disease (antiphospholipid syndrome, systemic lupus erythematosus) No  Two or more of  the following: Nulliparity Yes Obesity (body mass index >30 kg/m2) Yes Family history of preeclampsia in mother or sister No Age ?35 years No Sociodemographic characteristics (African American race, low socioeconomic level) Yes Personal risk factors (eg, previous pregnancy with low birth weight or small for gestational age infant, previous adverse pregnancy outcome [eg, stillbirth], interval >10 years between pregnancies) No  Indications for early 1 hour GTT (per uptodate)  BMI >25 (>23 in Asian women) AND one of the following  Gestational diabetes mellitus in a previous pregnancy No Glycated hemoglobin ?5.7 percent (39 mmol/mol), impaired glucose tolerance, or impaired  fasting glucose on previous testing No First-degree relative with diabetes Yes High-risk race/ethnicity (eg, African American, Latino, Native American, [place] American, Pacific Islander) Yes History of cardiovascular disease No Hypertension or on therapy for hypertension No High-density lipoprotein cholesterol level <35 mg/dL (0.90 mmol/L) and/or a triglyceride level >250 mg/dL (2.82 mmol/L) No Polycystic ovary syndrome No Physical inactivity No Other clinical condition associated with insulin resistance (eg, severe obesity, acanthosis nigricans) No Previous birth of an infant weighing ?4000 g No Previous stillbirth of unknown cause No   Exam   Vitals:   11/04/18 1504  BP: 122/74  Pulse: 97  Weight: 209 lb (94.8 kg)      Uterus:   Appropriate for dates   Pelvic Exam: Perineum: no hemorrhoids, normal perineum   Vulva: normal external genitalia, no lesions   Vagina:  normal mucosa, normal discharge   Cervix: no lesions and normal, pap smear done.    Adnexa: normal adnexa and no mass, fullness, tenderness   Bony Pelvis: average  System: General: well-developed, well-nourished female in no acute distress   Breast:  normal appearance, no masses or tenderness   Skin: normal coloration and turgor, no rashes   Neurologic: oriented, normal, negative, normal mood   Extremities: normal strength, tone, and muscle mass, ROM of all joints is normal   HEENT PERRLA, extraocular movement intact and sclera clear, anicteric   Mouth/Teeth mucous membranes moist, pharynx normal without lesions and dental hygiene good   Neck supple and no masses   Cardiovascular: regular rate and rhythm   Respiratory:  no respiratory distress, normal breath sounds   Abdomen: soft, non-tender; bowel sounds normal; no masses,  no organomegaly     Assessment:   Pregnancy: [id number] Patient Active Problem List   Diagnosis Date Noted  . Supervision of other normal pregnancy, antepartum 11/04/2018  . Pregnancy of  unknown anatomic location 10/13/2018     Plan:   1. Supervision of other normal pregnancy, antepartum - Culture, OB Urine - Genetic Screening - Obstetric Panel, Including HIV - [place] MFM OB COMP + 14 WK; Future - Prenatal Vit-Fe Fumarate-FA (PRENATAL MULTIVITAMIN) TABS tablet; Take 1 tablet by mouth daily at 12 noon. - CHL AMB BABYSCRIPTS SCHEDULE OPTIMIZATION - Prenatal MV & Min w/FA-DHA (PRENATAL ADULT GUMMY/DHA/FA) 0.4-25 MG CHEW; Chew 1 Dose by mouth daily.  Dispense: 30 tablet; Refill: 11 - Cervicovaginal ancillary only( [place])  2. Obesity in pregnancy Would likely benefit from Rx for Baby ASA at next visit due to risk factors.  Patient with BMI >25 and other risk factors (obesity, race, family history of DM).  - Hemoglobin A1c  3. Vaginal discharge Will have patient self swab to check for yeast infection.  - Cervicovaginal ancillary only( [place])  Problem list reviewed and updated. The nature of [facility] with multiple MDs and other Advanced Practice Providers was explained to  patient; also emphasized that residents, students are part of our team. Routine obstetric precautions reviewed. Return in about 4 weeks (around 12/02/2018) for routine PNC.   De Hollingshead 3:34 PM 11/04/18

## 2018-11-04 NOTE — Progress Notes (Signed)
Pt states thinks she may have a yeast Infection, would like Tx.States has pain in lower right pelvic area.

## 2018-11-05 ENCOUNTER — Encounter: Payer: Self-pay | Admitting: *Deleted

## 2018-11-05 LAB — CERVICOVAGINAL ANCILLARY ONLY
Bacterial vaginitis: NEGATIVE
Candida vaginitis: POSITIVE — AB
Chlamydia: NEGATIVE
Neisseria Gonorrhea: NEGATIVE
Trichomonas: NEGATIVE

## 2018-11-05 LAB — OBSTETRIC PANEL, INCLUDING HIV
Antibody Screen: NEGATIVE
Basophils Absolute: 0 10*3/uL (ref 0.0–0.2)
Basos: 0 %
EOS (ABSOLUTE): 0.1 10*3/uL (ref 0.0–0.4)
Eos: 1 %
HIV Screen 4th Generation wRfx: NONREACTIVE
Hematocrit: 36.2 % (ref 34.0–46.6)
Hemoglobin: 11.8 g/dL (ref 11.1–15.9)
Hepatitis B Surface Ag: NEGATIVE
Immature Grans (Abs): 0 10*3/uL (ref 0.0–0.1)
Immature Granulocytes: 0 %
Lymphocytes Absolute: 2.1 10*3/uL (ref 0.7–3.1)
Lymphs: 33 %
MCH: 25.7 pg — ABNORMAL LOW (ref 26.6–33.0)
MCHC: 32.6 g/dL (ref 31.5–35.7)
MCV: 79 fL (ref 79–97)
Monocytes Absolute: 0.4 10*3/uL (ref 0.1–0.9)
Monocytes: 6 %
Neutrophils Absolute: 3.8 10*3/uL (ref 1.4–7.0)
Neutrophils: 60 %
Platelets: 267 10*3/uL (ref 150–450)
RBC: 4.59 x10E6/uL (ref 3.77–5.28)
RDW: 13.3 % (ref 11.7–15.4)
RPR Ser Ql: NONREACTIVE
Rh Factor: POSITIVE
Rubella Antibodies, IGG: 2.62 index (ref >0.99)
WBC: 6.3 10*3/uL (ref 3.4–10.8)

## 2018-11-05 LAB — HEMOGLOBIN A1C
Est. average glucose Bld gHb Est-mCnc: 111 mg/dL
Hgb A1c MFr Bld: 5.5 % (ref 4.8–5.6)

## 2018-11-06 LAB — CULTURE, OB URINE

## 2018-11-06 LAB — URINE CULTURE, OB REFLEX

## 2018-11-11 ENCOUNTER — Encounter: Payer: Self-pay | Admitting: *Deleted

## 2018-11-16 ENCOUNTER — Other Ambulatory Visit: Payer: Self-pay | Admitting: General Practice

## 2018-11-16 ENCOUNTER — Encounter: Payer: Self-pay | Admitting: General Practice

## 2018-11-20 ENCOUNTER — Other Ambulatory Visit: Payer: Self-pay

## 2018-11-20 ENCOUNTER — Inpatient Hospital Stay (HOSPITAL_COMMUNITY)
Admission: EM | Admit: 2018-11-20 | Discharge: 2018-11-21 | Disposition: A | Discharge status: Home / Self Care | Payer: Medicaid Other | Attending: Obstetrics and Gynecology | Admitting: Obstetrics and Gynecology

## 2018-11-20 DIAGNOSIS — B379 Candidiasis, unspecified: Secondary | ICD-10-CM

## 2018-11-20 DIAGNOSIS — R109 Unspecified abdominal pain: Secondary | ICD-10-CM

## 2018-11-20 DIAGNOSIS — O26892 Other specified pregnancy related conditions, second trimester: Secondary | ICD-10-CM

## 2018-11-20 DIAGNOSIS — O98811 Other maternal infectious and parasitic diseases complicating pregnancy, first trimester: Secondary | ICD-10-CM | POA: Insufficient documentation

## 2018-11-20 DIAGNOSIS — Z3492 Encounter for supervision of normal pregnancy, unspecified, second trimester: Secondary | ICD-10-CM

## 2018-11-20 DIAGNOSIS — R1030 Lower abdominal pain, unspecified: Secondary | ICD-10-CM | POA: Insufficient documentation

## 2018-11-20 DIAGNOSIS — Z3A13 13 weeks gestation of pregnancy: Secondary | ICD-10-CM | POA: Insufficient documentation

## 2018-11-21 ENCOUNTER — Other Ambulatory Visit: Payer: Self-pay

## 2018-11-21 ENCOUNTER — Encounter (HOSPITAL_COMMUNITY): Payer: Self-pay | Admitting: Emergency Medicine

## 2018-11-21 DIAGNOSIS — B379 Candidiasis, unspecified: Secondary | ICD-10-CM | POA: Diagnosis not present

## 2018-11-21 DIAGNOSIS — R1030 Lower abdominal pain, unspecified: Secondary | ICD-10-CM | POA: Diagnosis present

## 2018-11-21 DIAGNOSIS — O98811 Other maternal infectious and parasitic diseases complicating pregnancy, first trimester: Secondary | ICD-10-CM | POA: Diagnosis not present

## 2018-11-21 DIAGNOSIS — Z3A13 13 weeks gestation of pregnancy: Secondary | ICD-10-CM | POA: Diagnosis not present

## 2018-11-21 LAB — URINALYSIS, ROUTINE W REFLEX MICROSCOPIC
Bilirubin Urine: NEGATIVE
Glucose, UA: NEGATIVE mg/dL
Hgb urine dipstick: NEGATIVE
Ketones, ur: NEGATIVE mg/dL
Leukocytes,Ua: NEGATIVE
Nitrite: NEGATIVE
Protein, ur: NEGATIVE mg/dL
Specific Gravity, Urine: 1.027 (ref 1.005–1.030)
pH: 6 (ref 5.0–8.0)

## 2018-11-21 MED ORDER — TERCONAZOLE 0.4 % VA CREA: 1.0000 | TOPICAL_CREAM | Freq: Every day | VAGINAL | 0 refills | Status: DC

## 2018-11-21 NOTE — Discharge Instructions (Signed)
Abdominal Pain During Pregnancy ° °Belly (abdominal) pain is common during pregnancy. There are many possible causes. Most of the time, it is not a serious problem. Other times, it can be a sign that something is wrong with the pregnancy. Always tell your doctor if you have belly pain. °Follow these instructions at home: °· Do not have sex or put anything in your vagina until your pain goes away completely. °· Get plenty of rest until your pain gets better. °· Drink enough fluid to keep your pee (urine) pale yellow. °· Take over-the-counter and prescription medicines only as told by your doctor. °· Keep all follow-up visits as told by your doctor. This is important. °Contact a doctor if: °· Your pain continues or gets worse after resting. °· You have lower belly pain that: °? Comes and goes at regular times. °? Spreads to your back. °? Feels like menstrual cramps. °· You have pain or burning when you pee (urinate). °Get help right away if: °· You have a fever or chills. °· You have vaginal bleeding. °· You are leaking fluid from your vagina. °· You are passing tissue from your vagina. °· You throw up (vomit) for more than 24 hours. °· You have watery poop (diarrhea) for more than 24 hours. °· Your baby is moving less than usual. °· You feel very weak or faint. °· You have shortness of breath. °· You have very bad pain in your upper belly. °Summary °· Belly (abdominal) pain is common during pregnancy. There are many possible causes. °· If you have belly pain during pregnancy, tell your doctor right away. °· Keep all follow-up visits as told by your doctor. This is important. °This information is not intended to replace advice given to you by your health care provider. Make sure you discuss any questions you have with your health care provider. °Document Released: 04/02/2009 Document Revised: 08/02/2018 Document Reviewed: 07/17/2016 °Elsevier Patient Education © 2020 [employer] Inc[employer] ° °

## 2018-11-21 NOTE — MAU Note (Signed)
Pt reports lower abdominal pain around 6pm. Reports the pain is intermittent, cramping and sharp. Denies vaginal bleeding or vaginal discharge. Rates pain 5/10.

## 2018-11-21 NOTE — MAU Provider Note (Signed)
History     CSN: [id number]  Arrival date and time: 11/20/18 2335   First Provider Initiated Contact with Patient 11/21/18 0105      Chief Complaint  Patient presents with  . Abdominal Pain   HPI   Ms..Bonnie Booth is a 18 y.o. female G3P0020 @ [redacted]w[redacted]d here in MAU with complaints of lower abdominal pain. She has had pain since 1800 today. This is the first time she has had this pain. The pain comes and goes. She rates the pain 4/10. Movement does not make the pain worse.  She just wants to make sure her baby is ok. She is not having any bleeding.   OB History    Gravida  3   Para  0   Term      Preterm      AB  2   Living        SAB  2   TAB      Ectopic      Multiple      Live Births              Past Medical History:  Diagnosis Date  . Rheumatoid arthritis (HCC)   . Rheumatoid arthritis [facility])     Past Surgical History:  Procedure Laterality Date  . NO PAST SURGERIES      History reviewed. No pertinent family history.  Social History   Tobacco Use  . Smoking status: Never Smoker  . Smokeless tobacco: Never Used  Substance Use Topics  . Alcohol use: No  . Drug use: Yes    Types: Marijuana    Comment: 3-4 days a week    Allergies:  Allergies  Allergen Reactions  . Apple Anaphylaxis and Swelling    Patient's throat swells  . Bee Venom Anaphylaxis and Swelling    Patient's throat swells  . Cinnamon Anaphylaxis and Swelling    Patient's throat swells  . Mango Flavor [Flavoring Agent] Anaphylaxis and Swelling    Patient's throat swells  . Peach [Prunus Persica] Anaphylaxis and Swelling  . Pineapple Anaphylaxis and Swelling    Patient's throat swells  . Tomato Nausea And Vomiting    Medications Prior to Admission  Medication Sig Dispense Refill Last Dose  . albuterol (PROVENTIL HFA;VENTOLIN HFA) 108 (90 Base) MCG/ACT inhaler Inhale 1-2 puffs into the lungs every 6 (six) hours as needed for wheezing or shortness of breath (cough). 1  Inhaler 0   . doxycycline (VIBRAMYCIN) 100 MG capsule Take 1 capsule (100 mg total) by mouth 2 (two) times daily. 28 capsule 0   . EPINEPHrine 0.3 mg/0.3 mL IJ SOAJ injection Inject 0.3 mg into the muscle once as needed for anaphylaxis.      . famotidine (PEPCID) 20 MG tablet Take 1 tablet (20 mg total) by mouth 2 (two) times daily. (Patient not taking: Reported on 08/12/2018) 30 tablet 0   . ibuprofen (ADVIL,MOTRIN) 200 MG tablet Take 200-400 mg by mouth every 6 (six) hours as needed (for pain or headaches).      . nitrofurantoin, macrocrystal-monohydrate, (MACROBID) 100 MG capsule Take 1 capsule (100 mg total) by mouth 2 (two) times daily. (Patient not taking: Reported on 08/12/2018) 10 capsule 0   . Prenatal MV & Min w/FA-DHA (PRENATAL ADULT GUMMY/DHA/FA) 0.4-25 MG CHEW Chew 1 Dose by mouth daily. 30 tablet 11   . Prenatal Vit-Fe Fumarate-FA (PRENATAL MULTIVITAMIN) TABS tablet Take 1 tablet by mouth daily at 12 noon.     Marland Kitchen Spacer/Aero-Holding  Chambers (AEROCHAMBER PLUS WITH MASK) inhaler Use as instructed 1 each 0     No results found for this or any previous visit (from the past 37 hour(s)).  Review of Systems  Constitutional: Negative for fever.  Gastrointestinal: Positive for abdominal pain.  Genitourinary: Negative for dysuria and vaginal bleeding.   Physical Exam   Blood pressure 123/65, pulse 94, temperature 98.9 F (37.2 C), temperature source Oral, resp. rate 17, height 5\' 7"  (1.702 m), weight 97 kg, last menstrual period 08/20/2018, SpO2 98 %.  Physical Exam  Constitutional: She is oriented to person, place, and time. She appears well-developed and well-nourished. No distress.  HENT:  Head: Normocephalic.  GI: Soft. She exhibits no distension. There is no abdominal tenderness. There is no rebound and no guarding.  Musculoskeletal: Normal range of motion.  Neurological: She is alert and oriented to person, place, and time.  Skin: Skin is warm. She is not diaphoretic.   Psychiatric: Her behavior is normal.   MAU Course  Procedures  Pt informed that the ultrasound is considered a limited OB ultrasound and is not intended to be a complete ultrasound exam.  Patient also informed that the ultrasound is not being completed with the intent of assessing for fetal or placental anomalies or any pelvic abnormalities.  Explained that the purpose of today's ultrasound is to assess for  viability.  Patient acknowledges the purpose of the exam and the limitations of the study.    MDM  Active fetus on [place] No pain currently, no pain on exam Patient reassured, [place] photos given.    Assessment and Plan   A:  1. Yeast infection   2. Abdominal pain in pregnancy, second trimester   3. Presence of fetal heart sounds in second trimester     P:  Discharge home in stable condition Reviewed wet prep results from recent, previous exam Rx: Terazol cream 7 Return to MAU if symptoms worsen Patient reassured  Blair Lundeen, Artist Pais, NP 11/23/2018 1:48 PM

## 2018-11-21 NOTE — ED Triage Notes (Signed)
Pt c/o 5/10 abd pain and HTN for the past week, no urinary symptom, no vaginal discharge. [redacted] weeks pregnant.

## 2018-11-25 ENCOUNTER — Encounter: Payer: Self-pay | Admitting: *Deleted

## 2018-12-02 ENCOUNTER — Telehealth (INDEPENDENT_AMBULATORY_CARE_PROVIDER_SITE_OTHER): Payer: Medicaid Other | Admitting: Obstetrics and Gynecology

## 2018-12-02 ENCOUNTER — Other Ambulatory Visit: Payer: Self-pay

## 2018-12-02 DIAGNOSIS — D563 Thalassemia minor: Secondary | ICD-10-CM | POA: Insufficient documentation

## 2018-12-02 DIAGNOSIS — Z3A14 14 weeks gestation of pregnancy: Secondary | ICD-10-CM

## 2018-12-02 DIAGNOSIS — O99112 Other diseases of the blood and blood-forming organs and certain disorders involving the immune mechanism complicating pregnancy, second trimester: Secondary | ICD-10-CM

## 2018-12-02 DIAGNOSIS — Z3480 Encounter for supervision of other normal pregnancy, unspecified trimester: Secondary | ICD-10-CM

## 2018-12-02 MED ORDER — BLOOD PRESSURE KIT DEVI: 1.0000 | Freq: Every day | 0 refills | Status: DC

## 2018-12-02 NOTE — Progress Notes (Signed)
   TELEHEALTH VIRTUAL OBSTETRICS VISIT ENCOUNTER NOTE  I connected with Bonnie Booth on 12/03/18 at  8:55 AM EDT by telephone at home and verified that I am speaking with the correct person using two identifiers.   I discussed the limitations, risks, security and privacy concerns of performing an evaluation and management service by telephone and the availability of in person appointments. I also discussed with the patient that there may be a patient responsible charge related to this service. The patient expressed understanding and agreed to proceed.  Subjective:  Bonnie Booth is a 18 y.o. G3P0020 at [redacted]w[redacted]d being followed for ongoing prenatal care.  She is currently monitored for the following issues for this low-risk pregnancy and has Pregnancy of unknown anatomic location; Supervision of other normal pregnancy, antepartum; and Alpha thalassemia silent carrier on their problem list.  Patient reports no complaints. Reports fetal movement. Denies any contractions, bleeding or leaking of fluid. She was seen In MAU recently and diagnosed with Yeast infection. She feels much better.   The following portions of the patient's history were reviewed and updated as appropriate: allergies, current medications, past family history, past medical history, past social history, past surgical history and problem list.   Objective:   General:  Alert, oriented and cooperative.   Mental Status: Normal mood and affect perceived. Normal judgment and thought content.  Rest of physical exam deferred due to type of encounter  Assessment and Plan:  Pregnancy: G3P0020 at [redacted]w[redacted]d  1. Supervision of other normal pregnancy, antepartum  - AFP, Serum, Open Spina Bifida; Future - Patient does not have BP cuff. BP ordered and patient instructed to pick up.   2. Alpha thalassemia silent carrier  - Recommend genetic counseling. She is scheduled for 8/10 @ 1500 with MFM.  - AFP, Serum, Open Spina Bifida; Future   Preterm labor symptoms and general obstetric precautions including but not limited to vaginal bleeding, contractions, leaking of fluid and fetal movement were reviewed in detail with the patient.  I discussed the assessment and treatment plan with the patient. The patient was provided an opportunity to ask questions and all were answered. The patient agreed with the plan and demonstrated an understanding of the instructions. The patient was advised to call back or seek an in-person office evaluation/go to [facility] for any urgent or concerning symptoms. Please refer to After Visit Summary for other counseling recommendations.   I provided 12 minutes of non-face-to-face time during this encounter.  No follow-ups on file.  Future Appointments  Date Time Provider [place]  12/06/2018  3:00 PM [place] [facility] [place], NP [facility] [place] [place] [facility]

## 2018-12-02 NOTE — Progress Notes (Signed)
I connected with  [name] on 12/02/18 at  8:55 AM EDT by telephone and verified that I am speaking with the correct person using two identifiers.   I discussed the limitations, risks, security and privacy concerns of performing an evaluation and management service by telephone and the availability of in person appointments. I also discussed with the patient that there may be a patient responsible charge related to this service. The patient expressed understanding and agreed to proceed.  Aviva Signs Edithe Dobbin, CMA 12/02/2018  9:00 AM  Sending bp cuff to [place]

## 2018-12-02 NOTE — Patient Instructions (Addendum)
Thalassemia  Thalassemia is a blood disorder that causes a low level of red blood cells (anemia). This condition is passed from parent to child through abnormal genes (gene mutations). The mutations make it hard for your body to make the protein in red blood cells (hemoglobin) that carries oxygen from your lungs to the rest of your body. Red blood cells do not live long without hemoglobin. Loss of red blood cells leads to anemia, which is the main symptom of thalassemia. There are two main types of thalassemia. The type depends on which part of the hemoglobin is affected.  Alpha thalassemia affects the alpha part of the hemoglobin. This is caused by four genes. You could get two from each parent.  Beta thalassemia affects the beta part of the hemoglobin. This is caused by two genes. You could get one from each parent. Thalassemia can be mild or severe. It depends on how many gene mutations you are born with. The more gene mutations you get, the more severe the condition. A person who inherits just one gene will be a carrier of the condition (thalassemia trait). A person with thalassemia trait may not have any symptoms or may have only mild anemia. A person who inherits two or more genes can have thalassemia minor, thalassemia intermedia, or thalassemia major. Thalassemia is a lifelong condition. There is no cure, but treatment can control symptoms and manage the condition. What are the causes? Thalassemia is cause by gene mutations that are passed down through families. What increases the risk? You are more likely to develop this condition if:  You have a family history of thalassemia.  Your ancestors are from [place], [place], [place], [place], [place], or the [place]. What are the signs or symptoms? The most common signs and symptoms of thalassemia are the signs and symptoms of anemia. They include:  Weakness.  Tiredness.  Pounding heartbeat.  Dizziness.  Headache.  Leg  cramps.  Pale skin.  Confusion.  Shortness of breath. Other signs and symptoms can also occur. You may have:  Yellow eyes or skin, and dark urine (jaundice). The breakdown of red blood cells can cause a yellowing pigment (bilirubin) to build up in your blood.  Weak bones (osteoporosis) and bone fractures. This is because bones can weaken from the effort of making more hemoglobin.  An enlarged spleen. This can lead to a swollen belly. Your spleen can become enlarged from filtering dead red blood cells.  Frequent, severe infections. This occurs if your spleen and bone marrow become weak. These organs make white blood cells that your body needs to fight infections. How is this diagnosed? Your health care provider may suspect thalassemia based on your signs and symptoms, especially if you have a family history of the condition. This condition may be diagnosed:  In childhood, if you have severe forms of thalassemia. This is because symptoms show early in life.  At birth. In the [place] babies are screened for this condition.  In adulthood, if you have thalassemia trait or thalassemia minor. This happens if symptoms of anemia start or if a routine blood test shows unexplained anemia. Blood tests can confirm a thalassemia diagnosis. Blood tests may show:  Low hemoglobin.  Low iron.  Abnormal hemoglobin.  Thalassemia gene mutations. You may need to see a health care provider who specializes in blood diseases (hematologist). How is this treated? Treatment for this condition depends on the type of thalassemia that you have:  If you have thalassemia trait or thalassemia minor,  you may not need treatment. However, you may need treatment if you have thalassemia minor and you develop symptoms during an infection.  If you have thalassemia intermedia, you will have symptoms that require treatment.  If you have major thalassemia, you will have serious symptoms that require regular treatment.  Thalassemia treatment may include:  Donated blood (transfusions) to replace red blood cells.  Vitamin B (folic acid) supplements to help produce hemoglobin and red blood cells.  Medicines or injections to remove iron buildup (chelation). This can happen in people who have frequent transfusions. Iron overload can damage heart, liver, and brain cells.  In the case of severe thalassemia: ? The spleen may need to be removed if it becomes damaged. ? Stem cell or bone marrow transplants may be necessary to transplant cells that can make red blood cells. This may be done if transfusions are not working. Follow these instructions at home: Eating and drinking   Follow instructions from your health care provider about eating or drinking restrictions. You may need to avoid foods or drinks that are high in iron or fortified with iron.  Eat foods that are high in fiber, such as fresh fruits and vegetables, whole grains, and beans. Limit foods that are high in fat and processed sugars, such as fried and sweet foods. Nutrition is important for preventing anemia. Activity  Return to your normal activities as told by your health care provider. Ask your health care provider what activities are safe for you.  Exercise is important for maintaining energy and strong bones. Ask your health care provider what amount and type of exercise is safe for you. General instructions   Take over-the-counter and prescription medicines only as told by your health care provider.  Keep all routine vaccinations and flu shots up to date to reduce your risk of infection.  Wash your hands frequently.  Do your best to avoid sick people, and stay out of crowds during cold and flu seasons.  Meet with a Dentist if you are or may become pregnant. A genetic counselor can explain the risks of passing thalassemia to a child.  Keep all follow-up visits as told by your health care provider. This is important. Contact a  health care provider if:  You have signs or symptoms of anemia.  You have a fever or other signs of infection.  Your belly is swollen.  You have jaundice. Get help right away if:  You feel very weak or short of breath. Summary  Thalassemia is a blood disorder that causes anemia.  Thalassemia can range from mild to severe.  This condition is passed down through families.  There is no cure, but treatment can manage the symptoms and prevent anemia. This information is not intended to replace advice given to you by your health care provider. Make sure you discuss any questions you have with your health care provider. Document Released: 08/11/2017 Document Revised: 08/11/2017 Document Reviewed: 08/11/2017 Elsevier Patient Education  2020 Elsevier Inc.     CIRCUMCISION  Circumcision is considered an elective/non-medically necessary procedure. There are many reasons parents decide to have their sons circumsized. During the first year of life circumcised males have a reduced risk of urinary tract infections but after this year the rates between circumcised males and uncircumcised males are the same.  It is safe to have your son circumcised outside of the hospital and the places above perform them regularly.    Places to have your son circumcised:    [facility] [phone] $480 by 4 wks  [facility] [phone] $244 by 4 wks  [facility] [phone] $175 by 2 wks  [facility] [phone] $250 by 7 days [facility] [phone] $150 by 4 wks  These prices sometimes change but are roughly what you can expect to pay. Please call and confirm pricing.

## 2018-12-06 ENCOUNTER — Ambulatory Visit (HOSPITAL_COMMUNITY): Payer: Medicaid Other | Attending: Obstetrics and Gynecology

## 2018-12-15 ENCOUNTER — Telehealth: Payer: Self-pay

## 2018-12-15 NOTE — Telephone Encounter (Signed)
LM for pt that we have her BP cuff here at the office from [facility].  If she could please come by the office to pick up her cuff.  Message sent to front office to call pt to schedule OB f/u appt.

## 2018-12-29 ENCOUNTER — Other Ambulatory Visit: Payer: Self-pay

## 2018-12-29 ENCOUNTER — Encounter (HOSPITAL_COMMUNITY): Payer: Self-pay

## 2018-12-29 ENCOUNTER — Inpatient Hospital Stay (HOSPITAL_COMMUNITY)
Admission: EM | Admit: 2018-12-29 | Discharge: 2018-12-29 | Disposition: A | Discharge status: Home / Self Care | Payer: Medicaid Other | Attending: Family Medicine | Admitting: Family Medicine

## 2018-12-29 DIAGNOSIS — O26892 Other specified pregnancy related conditions, second trimester: Secondary | ICD-10-CM | POA: Diagnosis not present

## 2018-12-29 DIAGNOSIS — R109 Unspecified abdominal pain: Secondary | ICD-10-CM | POA: Diagnosis not present

## 2018-12-29 DIAGNOSIS — Z3A18 18 weeks gestation of pregnancy: Secondary | ICD-10-CM | POA: Insufficient documentation

## 2018-12-29 LAB — URINALYSIS, ROUTINE W REFLEX MICROSCOPIC
Bilirubin Urine: NEGATIVE
Glucose, UA: NEGATIVE mg/dL
Hgb urine dipstick: NEGATIVE
Ketones, ur: NEGATIVE mg/dL
Nitrite: NEGATIVE
Protein, ur: NEGATIVE mg/dL
Specific Gravity, Urine: 1.008 (ref 1.005–1.030)
pH: 6 (ref 5.0–8.0)

## 2018-12-29 MED ORDER — ACETAMINOPHEN 500 MG PO TABS
1000.0000 mg | ORAL_TABLET | Freq: Once | ORAL | Status: AC
  Administered 2018-12-29: 1000 mg via ORAL
  Filled 2018-12-29: qty 2

## 2018-12-29 NOTE — Discharge Instructions (Signed)
Abdominal Pain During Pregnancy ° °Abdominal pain is common during pregnancy, and has many possible causes. Some causes are more serious than others, and sometimes the cause is not known. Abdominal pain can be a sign that labor is starting. It can also be caused by normal growth and stretching of muscles and ligaments during pregnancy. Always tell your health care provider if you have any abdominal pain. °Follow these instructions at home: °· Do not have sex or put anything in your vagina until your pain goes away completely. °· Get plenty of rest until your pain improves. °· Drink enough fluid to keep your urine pale yellow. °· Take over-the-counter and prescription medicines only as told by your health care provider. °· Keep all follow-up visits as told by your health care provider. This is important. °Contact a health care provider if: °· Your pain continues or gets worse after resting. °· You have lower abdominal pain that: °? Comes and goes at regular intervals. °? Spreads to your back. °? Is similar to menstrual cramps. °· You have pain or burning when you urinate. °Get help right away if: °· You have a fever or chills. °· You have vaginal bleeding. °· You are leaking fluid from your vagina. °· You are passing tissue from your vagina. °· You have vomiting or diarrhea that lasts for more than 24 hours. °· Your baby is moving less than usual. °· You feel very weak or faint. °· You have shortness of breath. °· You develop severe pain in your upper abdomen. °Summary °· Abdominal pain is common during pregnancy, and has many possible causes. °· If you experience abdominal pain during pregnancy, tell your health care provider right away. °· Follow your health care provider's home care instructions and keep all follow-up visits as directed. °This information is not intended to replace advice given to you by your health care provider. Make sure you discuss any questions you have with your health care  provider. °Document Released: 04/14/2005 Document Revised: 08/02/2018 Document Reviewed: 07/17/2016 °Elsevier Patient Education © 2020 Elsevier Inc. ° °

## 2018-12-29 NOTE — MAU Provider Note (Signed)
History     CSN: [id number]  Arrival date and time: 12/29/18 0226   First Provider Initiated Contact with Patient 12/29/18 [phone]      Chief Complaint  Patient presents with  . Abdominal Pain   Kelita TATIANNA IBBOTSON is a 18 y.o. G3P0020 at 70w4dwho [facility]  She presents today for Abdominal Pain.  Patient states the pain started about 3 days ago and is intermittent, but can last for up to 20 minutes when it occurs.  Patient states the pain starts in her left lower abdomen and radiates up to her umbilicus and around to her back.   Patient states that taking showers and position changes causes the pain to improve briefly.  Patient denies any aggravating or triggering factors for the pain.  She states she has felt fetal movement, but feels like it is decreased.  Patient denies vaginal bleeding, discharge, leaking, itching, or odor.  However, she states she has been intermittently using her Terazol cream to "make sure the infection is all gone."  Patient reports she last used it 4 days ago despite a lack of symptoms.  Patient reports that she has not tried any medication for her pain stating that she "didn't want to have a reaction." Patient goes on to state that she has taken tylenol in the past without issues.  Patient declines STD testing and goes on to decline pelvic exam stating "I just had those done and I haven't had any sex."  Patient does question if an [place] today.      OB History    Gravida  3   Para  0   Term      Preterm      AB  2   Living        SAB  2   TAB      Ectopic      Multiple      Live Births              Past Medical History:  Diagnosis Date  . [place]   . Rheumatoid arthritis ([facility]     Past Surgical History:  Procedure Laterality Date  . NO PAST SURGERIES      History reviewed. No pertinent family history.  Social History   Tobacco Use  . Smoking status: Never Smoker  . Smokeless  tobacco: Never Used  Substance Use Topics  . Alcohol use: No  . Drug use: Yes    Types: Marijuana    Comment: 3-4 days a week    Allergies:  Allergies  Allergen Reactions  . Apple Anaphylaxis and Swelling    Patient's throat swells  . Bee Venom Anaphylaxis and Swelling    Patient's throat swells  . Cinnamon Anaphylaxis and Swelling    Patient's throat swells  . Mango Flavor [Flavoring Agent] Anaphylaxis and Swelling    Patient's throat swells  . Peach [Prunus Persica] Anaphylaxis and Swelling  . Pineapple Anaphylaxis and Swelling    Patient's throat swells  . Tomato Nausea And Vomiting    Medications Prior to Admission  Medication Sig Dispense Refill Last Dose  . albuterol (PROVENTIL HFA;VENTOLIN HFA) 108 (90 Base) MCG/ACT inhaler Inhale 1-2 puffs into the lungs every 6 (six) hours as needed for wheezing or shortness of breath (cough). 1 Inhaler 0   . Blood Pressure Monitoring (BLOOD PRESSURE KIT) DEVI 1 Device by Does not apply route daily. ICD 10: Z34.00 1 Device 0   .  EPINEPHrine 0.3 mg/0.3 mL IJ SOAJ injection Inject 0.3 mg into the muscle once as needed for anaphylaxis.      . famotidine (PEPCID) 20 MG tablet Take 1 tablet (20 mg total) by mouth 2 (two) times daily. (Patient not taking: Reported on 08/12/2018) 30 tablet 0   . ibuprofen (ADVIL,MOTRIN) 200 MG tablet Take 200-400 mg by mouth every 6 (six) hours as needed (for pain or headaches).      . Prenatal MV & Min w/FA-DHA (PRENATAL ADULT GUMMY/DHA/FA) 0.4-25 MG CHEW Chew 1 Dose by mouth daily. 30 tablet 11   . Prenatal Vit-Fe Fumarate-FA (PRENATAL MULTIVITAMIN) TABS tablet Take 1 tablet by mouth daily at 12 noon.     Marland Kitchen Spacer/Aero-Holding Chambers (AEROCHAMBER PLUS WITH MASK) inhaler Use as instructed 1 each 0   . terconazole (TERAZOL 7) 0.4 % vaginal cream Place 1 applicator vaginally at bedtime. 45 g 0     Review of Systems  Constitutional: Negative for chills and fever.  Respiratory: Negative for cough and shortness  of breath.   Gastrointestinal: Positive for abdominal pain (Intermittent-None currently). Negative for constipation, diarrhea, nausea and vomiting.  Genitourinary: Negative for difficulty urinating, dysuria, vaginal bleeding and vaginal discharge.  Musculoskeletal: Positive for back pain (Intermittent-None currently).  Neurological: Negative for dizziness, light-headedness and headaches.   Physical Exam   Blood pressure 123/64, pulse (!) 110, temperature 99.3 F (37.4 C), temperature source Oral, resp. rate 16, height 5' 7"  (1.702 m), weight 98.4 kg, last menstrual period 08/20/2018, SpO2 98 %.  Physical Exam  Constitutional: She is oriented to person, place, and time. She appears well-developed and well-nourished.  HENT:  Head: Normocephalic and atraumatic.  Eyes: Conjunctivae are normal.  Neck: Normal range of motion.  Cardiovascular: Normal rate, regular rhythm and normal heart sounds.  Respiratory: Effort normal and breath sounds normal.  GI: Soft. Bowel sounds are normal. There is abdominal tenderness.  Musculoskeletal: Normal range of motion.  Neurological: She is alert and oriented to person, place, and time.  Skin: Skin is warm and dry.  Psychiatric: She has a normal mood and affect. Her behavior is normal.   FHR 160 by doppler MAU Course  Procedures Results for orders placed or performed during the hospital encounter of 12/29/18 (from the past 24 hour(s))  Urinalysis, Routine w reflex microscopic     Status: Abnormal   Collection Time: 12/29/18  3:04 AM  Result Value Ref Range   Color, Urine YELLOW YELLOW   APPearance CLEAR CLEAR   Specific Gravity, Urine 1.008 1.005 - 1.030   pH 6.0 5.0 - 8.0   Glucose, UA NEGATIVE NEGATIVE mg/dL   Hgb urine dipstick NEGATIVE NEGATIVE   Bilirubin Urine NEGATIVE NEGATIVE   Ketones, ur NEGATIVE NEGATIVE mg/dL   Protein, ur NEGATIVE NEGATIVE mg/dL   Nitrite NEGATIVE NEGATIVE   Leukocytes,Ua MODERATE (A) NEGATIVE   RBC / HPF 0-5 0 - 5  RBC/hpf   WBC, UA 0-5 0 - 5 WBC/hpf   Bacteria, UA RARE (A) NONE SEEN   Squamous Epithelial / LPF 0-5 0 - 5    MDM Physical Exam Pain Medication  Assessment and Plan  18 year old G27P0020 SIUP at 18.4 weeks Abdominal Pain  -Extensive discussion regarding pregnancy discomforts. -Educated on tylenol safety during pregnancy and how it is appropriate to try to treat pain prior to MAU visits. -Informed that [place] not routinely performed. -Patient declines pelvic exam and STD testing. -Will give tylenol 1 gram and reassess.   Maryann Conners  MSN, CNM 12/29/2018, 3:11 AM   Reassessment (4:03 AM)   -Patient reports relief with tylenol dosing. -Reiterated safety during pregnancy.  -Instructed to keep appt as scheduled. -Patient without questions or concerns. -Encouraged to call or return to MAU if symptoms worsen or with the onset of new symptoms. -Discharged to home in improved condition.  Maryann Conners MSN, CNM

## 2018-12-29 NOTE — MAU Note (Signed)
Pt here for intermittent sharp lower abdominal pain that radiates upward. Started 3 days ago. Hot showers and resting helps some. Has not tried Tylenol. Pt denies vaginal bleeding or discharge. Recently started treatment for yeast infection. Denies urinary s/s.

## 2019-01-05 ENCOUNTER — Telehealth: Payer: Self-pay | Admitting: Family Medicine

## 2019-01-05 ENCOUNTER — Telehealth: Payer: Self-pay | Admitting: Obstetrics & Gynecology

## 2019-01-05 NOTE — Telephone Encounter (Signed)
Left a detailed message about appointment, instructed to wear a face mas and was told to call our office if she have developed any COVID symptoms.

## 2019-01-05 NOTE — Telephone Encounter (Signed)
Spoke with patient about her appointment. She is not in town, and my not be coming back. She will have her new provider send a ROI if she chooses to not return.

## 2019-01-06 ENCOUNTER — Encounter: Payer: Medicaid Other | Admitting: Obstetrics & Gynecology

## 2019-01-06 ENCOUNTER — Ambulatory Visit (HOSPITAL_COMMUNITY): Payer: Medicaid Other

## 2019-01-21 ENCOUNTER — Inpatient Hospital Stay (HOSPITAL_COMMUNITY)
Admission: AD | Admit: 2019-01-21 | Discharge: 2019-01-21 | Discharge status: Against Medical Advice | Payer: Medicaid Other | Attending: Obstetrics and Gynecology | Admitting: Obstetrics and Gynecology

## 2019-01-21 ENCOUNTER — Other Ambulatory Visit: Payer: Self-pay

## 2019-01-21 ENCOUNTER — Encounter (HOSPITAL_COMMUNITY): Payer: Self-pay | Admitting: *Deleted

## 2019-01-21 DIAGNOSIS — R109 Unspecified abdominal pain: Secondary | ICD-10-CM | POA: Diagnosis not present

## 2019-01-21 DIAGNOSIS — O26892 Other specified pregnancy related conditions, second trimester: Secondary | ICD-10-CM

## 2019-01-21 DIAGNOSIS — R4020 Unspecified coma: Secondary | ICD-10-CM

## 2019-01-21 DIAGNOSIS — Z3A21 21 weeks gestation of pregnancy: Secondary | ICD-10-CM | POA: Insufficient documentation

## 2019-01-21 DIAGNOSIS — Y93E1 Activity, personal bathing and showering: Secondary | ICD-10-CM | POA: Insufficient documentation

## 2019-01-21 DIAGNOSIS — M069 Rheumatoid arthritis, unspecified: Secondary | ICD-10-CM | POA: Diagnosis not present

## 2019-01-21 DIAGNOSIS — W182XXA Fall in (into) shower or empty bathtub, initial encounter: Secondary | ICD-10-CM | POA: Diagnosis not present

## 2019-01-21 DIAGNOSIS — D563 Thalassemia minor: Secondary | ICD-10-CM

## 2019-01-21 DIAGNOSIS — W19XXXA Unspecified fall, initial encounter: Secondary | ICD-10-CM

## 2019-01-21 DIAGNOSIS — S0990XA Unspecified injury of head, initial encounter: Secondary | ICD-10-CM

## 2019-01-21 DIAGNOSIS — Z3480 Encounter for supervision of other normal pregnancy, unspecified trimester: Secondary | ICD-10-CM

## 2019-01-21 DIAGNOSIS — O9A212 Injury, poisoning and certain other consequences of external causes complicating pregnancy, second trimester: Secondary | ICD-10-CM | POA: Diagnosis not present

## 2019-01-21 NOTE — ED Triage Notes (Addendum)
Patient sent over from MAU for evaluation of her head post fall in the shower. Pt states that she los concscousness for "a second". Has already been evaluated and cleared by MAU.

## 2019-01-21 NOTE — MAU Provider Note (Addendum)
History     CSN: [id number]  Arrival date and time: 01/21/19 1807   First Provider Initiated Contact with Patient 01/21/19 1840      Chief Complaint  Patient presents with  . Fall  . Abdominal Pain   Bonnie Booth is a 18 y.o G3P0020 @ 61w6dwho presents to the MAU due to a fall.   Patient was examined in the presence of her Husband, RShonna Chock   Patient states she slipped and fell in her shower ~ 5 pm this evening. She landed on her left side landing specifically on her left side, head, and lower back region. She describes "blacking out" once she hit her head. Denies tongue biting or urinary/fecal incontinence during this event. Defers to husband to recount some events of fall. She currently endorses constant 6/10 pain on left side of her abdomen and left side of her face/jaw. Denies any aggravating or alleviating factors. She denies headache, changes in vision, nausea, chest palpitations/pains or right sided abdominal pain. Reports + FM. No VB, LOF, or cramping.   Her prenatal course has been uneventful. She denies vaginal bleeding/spotting and loss of fluid. In addition, patient endorses fetal movement while in MAU.   Fall The accident occurred 1 to 3 hours ago. The point of impact was the head, face and buttocks (left abdomen). The pain is present in the head and buttocks (left abdomen). Associated symptoms include abdominal pain. Pertinent negatives include no headaches, hearing loss, nausea, visual change or vomiting.  Abdominal Pain The current episode started today. The onset quality is sudden. The problem occurs constantly. The problem has been gradually improving. The pain is located in the LLQ and LUQ. The quality of the pain is sharp. Pertinent negatives include no headaches, nausea or vomiting.    OB History    Gravida  3   Para  0   Term      Preterm      AB  2   Living        SAB  2   TAB      Ectopic      Multiple      Live Births               Past Medical History:  Diagnosis Date  . [place]   . [facility] ([facility]     Past Surgical History:  Procedure Laterality Date  . NO PAST SURGERIES      History reviewed. No pertinent family history.  Social History   Tobacco Use  . Smoking status: Never Smoker  . Smokeless tobacco: Never Used  Substance Use Topics  . Alcohol use: No  . Drug use: Yes    Types: Marijuana    Comment: pt states years ago    Allergies:  Allergies  Allergen Reactions  . Apple Anaphylaxis and Swelling    Patient's throat swells  . Bee Venom Anaphylaxis and Swelling    Patient's throat swells  . Cinnamon Anaphylaxis and Swelling    Patient's throat swells  . Mango Flavor [Flavoring Agent] Anaphylaxis and Swelling    Patient's throat swells  . Peach [Prunus Persica] Anaphylaxis and Swelling  . Pineapple Anaphylaxis and Swelling    Patient's throat swells  . Tomato Nausea And Vomiting    Medications Prior to Admission  Medication Sig Dispense Refill Last Dose  . Blood Pressure Monitoring (BLOOD PRESSURE KIT) DEVI 1 Device by Does not apply route daily. ICD 10: Z34.00 1 Device 0   .  EPINEPHrine 0.3 mg/0.3 mL IJ SOAJ injection Inject 0.3 mg into the muscle once as needed for anaphylaxis.      . famotidine (PEPCID) 20 MG tablet Take 1 tablet (20 mg total) by mouth 2 (two) times daily. (Patient not taking: Reported on 08/12/2018) 30 tablet 0   . Prenatal MV & Min w/FA-DHA (PRENATAL ADULT GUMMY/DHA/FA) 0.4-25 MG CHEW Chew 1 Dose by mouth daily. 30 tablet 11 More than a month at Unknown time  . Prenatal Vit-Fe Fumarate-FA (PRENATAL MULTIVITAMIN) TABS tablet Take 1 tablet by mouth daily at 12 noon.   More than a month at Unknown time  . Spacer/Aero-Holding Chambers (AEROCHAMBER PLUS WITH MASK) inhaler Use as instructed 1 each 0 More than a month at Unknown time    Review of Systems  HENT: Negative for hearing loss and tinnitus.   Respiratory: Negative for chest  tightness and shortness of breath.   Cardiovascular: Negative for chest pain.  Gastrointestinal: Positive for abdominal pain. Negative for nausea and vomiting.  Genitourinary: Negative for vaginal bleeding and vaginal discharge.  Musculoskeletal: Positive for back pain.  Neurological: Positive for syncope. Negative for light-headedness and headaches.   Physical Exam   Blood pressure 107/65, pulse (!) 101, temperature 98.2 F (36.8 C), temperature source Oral, resp. rate 18, last menstrual period 08/20/2018.  Physical Exam  Constitutional: She is oriented to person, place, and time. She appears well-developed and well-nourished. No distress.  HENT:  Head: Normocephalic. Head is without raccoon's eyes, without abrasion and without laceration.  Eyes: Pupils are equal, round, and reactive to light. EOM are normal.  Neck: No tracheal deviation present.  Cardiovascular: Normal rate and regular rhythm.  Respiratory: Effort normal and breath sounds normal. No respiratory distress.  GI: Soft. Bowel sounds are normal. She exhibits no distension. There is abdominal tenderness (LLQ). There is no rebound and no guarding.  Mild tenderness to palpation LLQ gravid  Genitourinary:    Genitourinary Comments: Cervix closed, long   Musculoskeletal: Normal range of motion.  Neurological: She is alert and oriented to person, place, and time. She has normal strength and normal reflexes. No cranial nerve deficit. GCS eye subscore is 4. GCS verbal subscore is 5. GCS motor subscore is 6.  Skin: Skin is warm and dry. No erythema.  FHT 156  MAU Course  Procedures  MDM  Marijean Bravo Brinson 01/21/2019, 7:10 PM  I confirm that I have verified the information documented in the medical student's note and that I have also personally reperformed the history, physical exam and all medical decision making activities of this service and have verified that all service and findings are accurately documented in this  student's note.   Julianne Handler, CNM 01/21/2019 7:31 PM   No evidence of OB complication. Pt reassured and given return precautions. Normal neuro exam but recommend further eval in ED for head trauma/LOC. Presentation and clinical findings discussed with Dr. Tyrone Nine, plan for transfer to [facility].  Assessment and Plan  [redacted] weeks gestation S/p fall Head trauma Transfer to ED  Julianne Handler, [place]  01/21/2019 7:34 PM

## 2019-01-21 NOTE — MAU Note (Signed)
Took a hard fall in the shower, hit abd, bottom and head. (fell back).  Came up through ED

## 2019-01-21 NOTE — ED Notes (Signed)
Patient states she feels fine and wants to leave. Advised pt to stay and be seen. States she "fell and blacked out but is fine and is leaving" AMA.

## 2019-02-02 ENCOUNTER — Ambulatory Visit (INDEPENDENT_AMBULATORY_CARE_PROVIDER_SITE_OTHER): Payer: Medicaid Other | Admitting: Medical

## 2019-02-02 ENCOUNTER — Encounter: Payer: Self-pay | Admitting: Medical

## 2019-02-02 ENCOUNTER — Other Ambulatory Visit: Payer: Self-pay

## 2019-02-02 DIAGNOSIS — Z23 Encounter for immunization: Secondary | ICD-10-CM | POA: Diagnosis not present

## 2019-02-02 DIAGNOSIS — D563 Thalassemia minor: Secondary | ICD-10-CM

## 2019-02-02 DIAGNOSIS — Z3482 Encounter for supervision of other normal pregnancy, second trimester: Secondary | ICD-10-CM | POA: Diagnosis present

## 2019-02-02 DIAGNOSIS — Z3480 Encounter for supervision of other normal pregnancy, unspecified trimester: Secondary | ICD-10-CM

## 2019-02-02 DIAGNOSIS — Z3A23 23 weeks gestation of pregnancy: Secondary | ICD-10-CM

## 2019-02-02 NOTE — Progress Notes (Signed)
   PRENATAL VISIT NOTE  Subjective:  Bonnie Booth is a 18 y.o. G3P0020 at [redacted]w[redacted]d being seen today for ongoing prenatal care.  She is currently monitored for the following issues for this low-risk pregnancy and has Supervision of other normal pregnancy, antepartum and Alpha thalassemia silent carrier on their problem list.  Patient reports dizziness.  Contractions: Not present. Vag. Bleeding: None.  Movement: Present. Denies leaking of fluid.   The following portions of the patient's history were reviewed and updated as appropriate: allergies, current medications, past family history, past medical history, past social history, past surgical history and problem list.   Objective:   Vitals:   02/02/19 1511  BP: 122/63  Pulse: (!) 110  Temp: 99.3 F (37.4 C)  Weight: 213 lb 6.4 oz (96.8 kg)    Fetal Status: Fetal Heart Rate (bpm): 154   Movement: Present     General:  Alert, oriented and cooperative. Patient is in no acute distress.  Skin: Skin is warm and dry. No rash noted.   Cardiovascular: Normal heart rate noted  Respiratory: Normal respiratory effort, no problems with respiration noted  Abdomen: Soft, gravid, appropriate for gestational age.  Pain/Pressure: Absent     Pelvic: Cervical exam deferred        Extremities: Normal range of motion.  Edema: Trace  Mental Status: Normal mood and affect. Normal behavior. Normal judgment and thought content.   Assessment and Plan:  Pregnancy: H6P5916 at [redacted]w[redacted]d 1. Supervision of other normal pregnancy, antepartum - Flu Vaccine QUAD 36+ mos IM (Fluarix, Quad PF) - AFP, Serum, Open Spina Bifida - Patient missed Anatomy US - will reschedule for first available today   2. Alpha thalassemia silent carrier - patient missed genetic counseling appointment - will reschedule through [facility]   3. Near syncope - Patient encouraged to have small frequent snacks as most episodes have been just prior to dinner so may be hypoglycemia  - Encouraged  increased water intake   Preterm labor symptoms and general obstetric precautions including but not limited to vaginal bleeding, contractions, leaking of fluid and fetal movement were reviewed in detail with the patient. Please refer to After Visit Summary for other counseling recommendations.   Return in about 4 weeks (around 03/02/2019) for LOB, 28 week labs (fasting), In-Person.  No future appointments.  Kerry Hough, PA-C

## 2019-02-02 NOTE — Progress Notes (Signed)
Pt states for the last 3 night has been having dizziness & being very hot, only happens at night.

## 2019-02-02 NOTE — Patient Instructions (Signed)
AREA PEDIATRIC/FAMILY PRACTICE PHYSICIANS  [place] ([address]) . [facility] o Chambliss, MD; Eniola, MD; Hale, MD; Hensel, MD; McDiarmid, MD; McIntyer, MD; Neal, MD; Walden, MD o [address] [place], [place] [address] o [phone] o Mon-Fri 8:30-12:30, 1:30-5:00 o Providers come to see babies at [facility] o Accepting Medicaid . [facility] o Limited providers who accept newborns: Koirala, MD; Morrow, MD; Wolters, MD o [address], [place], [place] [address] o [phone] o Mon-Fri 8:00-5:30 o Babies seen by providers at [facility] o Does NOT accept Medicaid o Please call early in hospitalization for appointment (limited availability)  . [facility] o [place], MD o [address] [place], [place] [address] o [phone] o Mon, Tue, Thur, Fri 8:30-5:00, Wed 10:00-7:00 (closed 1-2pm) o Babies seen by [facility] providers o Accepting Medicaid . [facility] - Pediatrician o Rubin, MD o [address], [place], [place] [address] o [phone] o Mon-Fri 8:30-5:00, Sat 8:30-12:00 o Provider comes to see babies at [facility] o Accepting Medicaid o Must have been referred from current patients or contacted office prior to delivery . [facility] o Stensland, MD; Chandler, MD; Ettefagh, MD; Grant, MD; Lester, MD; McCormick, MD; McQueen, MD; Prose, MD; Simha, MD; Stanley, MD; Stryffeler, NP; Tebben, NP o [address], [place], [place] [address] o [phone] o Mon, Tue, Thur, Fri 8:30-5:30, Wed 9:30-5:30, Sat 8:30-12:30 o Babies seen by [facility] providers o Accepting Medicaid o Only accepting infants of first-time parents or siblings of current patients o Hospital discharge coordinator will make follow-up appointment . Jack Amos o [address],  [place], [place]  [address] o [phone]   Fax - [phone] . [facility] o [address], [place], [place]  [address] o Phone - [phone]   Fax - [phone] . Shilpa Gosrani o [address], [place], [place]  [address] o [phone]  [place] ([address]) . [facility] o Bates, MD; [place], MD; Cooper, Cox, MD; MD; Davis, MD; Dovico, MD; Ettefaugh, MD; Little, MD; Lowe, MD; Keiffer, MD; Melvin, MD; Sumner, MD; Williams, MD o [address], [place], [place] [address] o [phone] o Mon-Fri 8:30-5:00 (extended evenings Mon-Thur as needed), Sat-Sun 10:00-1:00 o Providers come to see babies at [facility] o Accepting Medicaid for families of first-time babies and families with all children in the household age 3 and under. Must register with office prior to making appointment (M-F only). . [facility] o Henson, NP; Knapp, MD; Lalonde, MD; Tysinger, PA o [address] [place], [place] [address] o [phone] o Mon-Fri 8:00-5:00 o Babies seen by providers at [facility] o Does NOT accept Medicaid/Commercial Insurance Only . [facility] [place] ([facility])  o Artis, MD; Barnes, MD; Bratton, MD; Coccaro, MD; Lockett Gardner, MD; Kramer, MD; Marshall, MD; Netherton, MD; Poleto, MD; Skinner, MD o [address] [place], [place] [address] o [phone] o Mon-Fri 8:30-5:30, Sat (Oct.-Mar.) 9:00-1:00 o Babies seen by providers at [facility] o Accepting Medicaid  [place] ([address]) . [facility] o Reid, MD; Warner, MD o [address], [place],  [address] o [phone] o Mon-Fri 8:30-5:00, Sat 8:30-12:00 o Providers come to see babies at [facility] o Does NOT accept Medicaid . [facility] Ricci Barker, PA; Mannie Stabile, MD; [place], [place]; Nancy Fetter, MD; Moreen Fowler, [place],  [place], [place] [address] o [phone] o Mon-Fri 8:00-5:00 o Babies seen by providers at [facility] o Does NOT accept Medicaid o Only accepting babies of parents who are patients o Please call early in hospitalization for appointment (limited availability) . [facility] Pediatricians Blanca Friend, MD; Sharlene Motts, MD; Rod Can, MD; Warner Mccreedy, NP; Sabra Heck, MD; Ermalinda Memos, MD; Sharlett Iles, NP; Aurther Loft, MD; Jerrye Beavers, MD; Marcello Moores, MD; Berline Lopes, MD; Charolette Forward, MD o [place]. [place], [place], [place] [address] o [phone] o Mon-Fri 8:00-5:00, Sat 9:00-12:00 o Providers come to see babies at [facility] o Does NOT accept [facility] [phone]) . [facility] [place] providers accepting new patients: Dayna Ramus, NP; [place], [place], [place], [place] [address] o [phone] o Mon-Fri 8:00-5:00 o Babies seen by providers at [facility] o Does NOT accept Medicaid o Only accepting babies of parents who are patients o Please call early in hospitalization for appointment (limited availability) . [facility] Oswaldo Conroy, MD; Sheran Lawless, MD o [address] [place], [place] [address] o [phone] (press 1 to schedule appointment) o Mon-Fri 8:00-5:00 o Providers come to see babies at [facility] o Does NOT accept Medicaid . [facility] Jodi Mourning, MD o [address] [place], [place] [address] o [phone] o Mon-Fri 8:30-5:00 (lunch 12:30-1:00), extended hours by appointment only Wed 5:00-6:30 o Babies seen by [facility] providers o Accepting Medicaid . [place] at Evalyn Casco, MD; [place], MD; Ethlyn Gallery, MD o [place], [place], [place] [address] o [phone] o Mon-Fri 8:00-5:00 o Babies seen by [facility] providers o Does NOT accept Medicaid . Therapist, music at [place], MD; Yong Channel, MD; [place], [place] [address] [place], [place] [address] o  [phone] o Mon-Fri 8:00-5:00 o Babies seen by [facility] providers o Does NOT accept Medicaid . [place], [place]; [place], [place]; [place], NP; Albertina Parr, MD; Frederic Jericho, MD; Ronney Lion, MD; Carlos Levering, NP; Jerelene Redden, NP; Tomasita Crumble, NP; Ronelle Nigh, NP; Corinna Lines, MD; [place], MD o [address] [place], [place] [address] o [phone] o Mon-Fri 8:30-5:00, Sat 10:00-1:00 o Providers come to see babies at [facility] o Does NOT accept Medicaid o Free prenatal information session Tuesdays at 4:45pm . [facility] Porfirio Oar, MD; [place], [place]; [place], [place]; [place], [place]., [place] [address] o [phone] o Mon-Fri 7:30-5:30 o Babies seen by [facility] providers . [facility] Children's Doctor o [address], [place], [place], [place]  [address] o [phone]   Fax - [phone]  [place] [phone] & [phone]) . [place], MD o [address] [place], [place] [address] o [phone] o Mon-Thur 8:00-6:00 o Providers come to see babies at [place] Medicaid . [place], NP; Melford Aase, MD; [place], [place]; [place], [address] [place], [place] [address] o [phone] o Mon-Thur 7:30-7:30, Fri 7:30-4:30 o Babies seen by [facility] providers o Accepting Medicaid . [facility] Nyra Jabs, MD; Cristino Martes, NP; Gertie Baron, MD o [place] [address], [place], [place] [address] o [phone] o Mon-Fri 8:30-5:00, Sat 8:30-12:00 o Providers come to see babies at [place] Medicaid o Must have "Meet & Greet" appointment at office prior to delivery . [place] ([place]) o Hubbard,  MD; Juleen China, MD; Clydene Laming, [place] [address], [place], [place] [address] o [phone] o Mon-Wed 8:00-6:00, Thur-Fri 8:00-5:00, Sat 9:00-12:00 o Providers come to see  babies at [facility] o Does NOT accept Medicaid o Only accepting siblings of current patients . [facility], [place], [place], [place]  [address] o [phone]   Fax [phone] . [place] at [place] [address], [place], [place]  [address] o [phone]   Fax - Artois Craig [phone] & [phone]) . Therapist, music at [place], DO; [place], [address] [place], [place] [address] o [phone] o Mon-Fri 7:00-5:00 o Babies seen by [facility] providers o Does NOT accept Medicaid . [place], MD; [place], [place]; [place], [place], [place], [place] [address] o [phone] o Mon-Fri 8:00-5:00 o Babies seen by [facility] providers o Accepting Medicaid . Reed, MD; [place], [place]; [place], NP; [place], [place], [place], [place] [address] o [phone] o Mon-Fri 8:00-5:00 o Babies seen by providers at [place] [phone]) . [facility] [place], [place] o [address] [place], [place] [address] o [phone] o Mon-Fri 8:00-5:00 o Babies seen by [facility] providers o Does NOT accept Medicaid o Limited availability, please call early in hospitalization to schedule follow-up . [facility] Leilani Merl, PA; Maisie Fus, MD; [place], MD; [place], [place]; Jeannine Kitten, MD; [place], [place]y [facility] [address], [place], [place] [address] o [phone] o Mon-Fri 8:30-5:00, Sat 9:00-12:00 o Babies seen by providers at [facility] o Accepting Medicaid o Please register online then schedule online or call office o [email or web address] . [place] [facility] [facility] [place]) Kristian Covey, NP; Dwyane Dee, MD; Leonidas Romberg, PA o [address] [place], [place], [place] [address] o [phone] o Mon-Fri 8:00-5:00 o Babies seen by providers at [facility] o Accepting Medicaid . [place] ([facility]) Dairl Ponder, MD; Rayvon Char, NP; Melina Modena, MD o [address] [place], [place], [place] [address] o [phone] o Mon-Fri 8:00-5:30, Sat&Sun by appointment (phones open at 8:30) o Babies seen by [facility] providers o Accepting Medicaid o Must be a first-time baby or sibling of current patient . [place], [address], [place], [place]  [address] o [phone]   Fax - [phone]  [place] [phone] & [phone]) . [place], [place]; [place], [place]; Benjamine Mola, MD; [place], [place]; Harrell Lark, MD o [address], [place], [place] [address] o [phone] o Mon-Thur 8:00-7:00, Fri 8:00-5:00, Sat 8:00-12:00, Sun 9:00-12:00 o Babies seen by [facility] providers o Accepting Medicaid . [facility] at [facility], MD; Ruthann Cancer, MD; [facility], MD o [address], [place],  [address] o [phone] o Mon-Thur 8:00-5:00 o Babies seen by providers at [facility] o Accepting Medicaid . [facility] at [place], MD; Coe-Goins, MD; Amedeo Plenty, MD; Bobby Rumpf, MD; List, MD; Lavonia Drafts, MD; Ruthann Cancer, MD; Selinda Eon, MD; Audie Box, MD; Jim Like, MD; Christie Nottingham, MD; [place], MD; Modena Nunnery, MD o [address] [place], [place]  [address] o [phone] o Mon-Fri 8:00-5:30, Sat (Oct.-Mar.) 9:00-1:00 o Babies seen by providers at [facility] o Accepting Medicaid o Must fill out new patient packet, available online at [email or web address] . [facility] - [place] [facility] at [facility]) Simone Curia, NP; Tiburcio Pea, NP; Tresa Endo, NP; Whitney Post, MD; [place], [place];  Hennie Duos, MD; Wynne Dust, MD; Kavin Leech, NP o [address], [place], [place] [address] o [phone] o Mon-Thur 8:00-5:30, Fri 8:00-5:00 o Babies seen by providers at [facility] o Accepting [facility] [phone]) . [facility] o [place], [place]; [place], MD; Tanya Nones, MD; [place], [place] o [address] [place], [place] [address] o [phone] o Mon-Fri 8:00-5:00 o Babies seen by providers at [facility] o Accepting [facility] [phone]) . [facility] at [facility] o [place], DO; Lenise Arena, MD; [place], [place] o [address], [place], [place] [address] o [phone] o Mon-Fri 8:00-5:00 o Babies seen by providers at [facility] o Does NOT accept Medicaid o Limited appointment availability, please call early in hospitalization  . Nature conservation officer at [facility] o [place], DO; [place], MD o [address], [place], [place] [address] o [phone] o Mon-Fri 8:00-5:00 o Babies seen by [facility] providers o Does NOT accept Medicaid . [facility] Lorrine Kin, MD; Ninetta Lights, MD; [place], [place]; [place], MD o [address], [place], [place] [address] o [phone] o Mon-Fri 8:00-5:00 o After hours clinic [facility] [place], [place] [address]) [phone] Mon-Fri 5:00-8:00, Sat 12:00-6:00, Sun 10:00-4:00 o Babies seen by [facility] providers o Accepting Medicaid . [facility] at [facility] o [address], [place], [place]  [address] o [phone]   Fax - [phone]  [place] [phone]) . Nature conservation officer at [facility], MD o [address] [place], [place], [place] [address] o [phone] o Mon-Fri 8:00-5:00 o Babies seen by [facility] providers o Does NOT accept Medicaid . [facility] - [place] [facility] at [place]) Tomi Likens, MD o [address] [place] [address] [place], [place] [address] o [phone] o  Mon-Thur 8:00-7:00, Fri 8:00-5:00, Sat 8:00-12:00 o Babies seen by providers at [facility] o Accepting Medicaid - but does not have vaccinations in office (must be received elsewhere) o Limited availability, please call early in hospitalization  [place] ([phone]) .  Pediatrics  o Wyvonne Lenz, MD o [address], [place] [address] o [phone]  Fax [phone]   Contraception Choices Contraception, also called birth control, means things to use or ways to try not to get pregnant. Hormonal birth control This kind of birth control uses hormones. Here are some types of hormonal birth control:  A tube that is put under skin of the arm (implant). The tube can stay in for as long as 3 years.  Shots to get every 3 months (injections).  Pills to take every day (birth control pills).  A patch to change 1 time each week for 3 weeks (birth control patch). After that, the patch is taken off for 1 week.  A ring to put in the vagina. The ring is left in for 3 weeks. Then it is taken out of the vagina for 1 week. Then a new ring is put in.  Pills to take after unprotected sex (emergency birth control pills). Barrier birth control Here are some types of barrier birth control:  A thin  covering that is put on the penis before sex (female condom). The covering is thrown away after sex.  A soft, loose covering that is put in the vagina before sex (female condom). The covering is thrown away after sex.  A rubber bowl that sits over the cervix (diaphragm). The bowl must be made for you. The bowl is put into the vagina before sex. The bowl is left in for 6-8 hours after sex. It is taken out within 24 hours.  A small, soft cup that fits over the cervix (cervical cap). The cup must be made for you. The cup can be left in for 6-8 hours after sex. It is taken out within 48 hours.  A sponge that is put into the vagina before sex. It must be left in for at least 6 hours  after sex. It must be taken out within 30 hours. Then it is thrown away.  A chemical that kills or stops sperm from getting into the uterus (spermicide). It may be a pill, cream, jelly, or foam to put in the vagina. The chemical should be used at least 10-15 minutes before sex. IUD (intrauterine) birth control An IUD is a small, T-shaped piece of plastic. It is put inside the uterus. There are two kinds:  Hormone IUD. This kind can stay in for 3-5 years.  Copper IUD. This kind can stay in for 10 years. Permanent birth control Here are some types of permanent birth control:  Surgery to block the fallopian tubes.  Having an insert put into each fallopian tube.  Surgery to tie off the tubes that carry sperm (vasectomy). Natural planning birth control Here are some types of natural planning birth control:  Not having sex on the days the woman could get pregnant.  Using a calendar: ? To keep track of the length of each period. ? To find out what days pregnancy can happen. ? To plan to not have sex on days when pregnancy can happen.  Watching for symptoms of ovulation and not having sex during ovulation. One way the woman can check for ovulation is to check her temperature.  Waiting to have sex until after ovulation. Summary  Contraception, also called birth control, means things to use or ways to try not to get pregnant.  Hormonal methods of birth control include implants, injections, pills, patches, vaginal rings, and emergency birth control pills.  Barrier methods of birth control can include female condoms, female condoms, diaphragms, cervical caps, sponges, and spermicides.  There are two types of IUD (intrauterine device) birth control. An IUD can be put in a woman's uterus to prevent pregnancy for 3-5 years.  Permanent sterilization can be done through a procedure for males, females, or both.  Natural planning methods involve not having sex on the days when the woman could  get pregnant. This information is not intended to replace advice given to you by your health care provider. Make sure you discuss any questions you have with your health care provider. Document Released: 02/09/2009 Document Revised: 08/04/2018 Document Reviewed: 04/24/2016 Elsevier Patient Education  2020 [employer].

## 2019-02-04 ENCOUNTER — Telehealth: Payer: Self-pay

## 2019-02-04 NOTE — Telephone Encounter (Signed)
Called pt to advise if she already has not talked with [facility], reg. Genetic Counseling,to call them to schedule , [phone] per Johnsie Cancel, no answer, left VM.

## 2019-02-07 LAB — AFP, SERUM, OPEN SPINA BIFIDA
AFP MoM: 1.05
AFP Value: 77.7 ng/mL
Gest. Age on Collection Date: 23.6 weeks
Maternal Age At EDD: 20.7 yr
OSBR Risk 1 IN: 10000
Test Results:: NEGATIVE
Weight: 213 [lb_av]

## 2019-02-17 ENCOUNTER — Other Ambulatory Visit: Payer: Self-pay

## 2019-02-17 ENCOUNTER — Other Ambulatory Visit: Payer: Self-pay | Admitting: Internal Medicine

## 2019-02-17 ENCOUNTER — Ambulatory Visit (HOSPITAL_COMMUNITY)
Admission: RE | Admit: 2019-02-17 | Discharge: 2019-02-17 | Disposition: A | Discharge status: Home / Self Care | Payer: Medicaid Other | Source: Ambulatory Visit | Attending: Obstetrics and Gynecology | Admitting: Obstetrics and Gynecology

## 2019-02-17 DIAGNOSIS — Z3480 Encounter for supervision of other normal pregnancy, unspecified trimester: Secondary | ICD-10-CM

## 2019-02-17 DIAGNOSIS — Z148 Genetic carrier of other disease: Secondary | ICD-10-CM | POA: Diagnosis not present

## 2019-02-17 DIAGNOSIS — O99212 Obesity complicating pregnancy, second trimester: Secondary | ICD-10-CM | POA: Diagnosis not present

## 2019-02-17 DIAGNOSIS — Z3A25 25 weeks gestation of pregnancy: Secondary | ICD-10-CM | POA: Diagnosis not present

## 2019-02-21 ENCOUNTER — Other Ambulatory Visit: Payer: Self-pay

## 2019-02-21 ENCOUNTER — Encounter (HOSPITAL_COMMUNITY): Payer: Self-pay | Admitting: Emergency Medicine

## 2019-02-21 ENCOUNTER — Inpatient Hospital Stay (HOSPITAL_COMMUNITY)
Admission: EM | Admit: 2019-02-21 | Discharge: 2019-02-22 | Disposition: A | Discharge status: Other Acute Inpatient Hospital | Payer: Medicaid Other | Attending: Obstetrics and Gynecology | Admitting: Obstetrics and Gynecology

## 2019-02-21 DIAGNOSIS — K219 Gastro-esophageal reflux disease without esophagitis: Secondary | ICD-10-CM | POA: Insufficient documentation

## 2019-02-21 DIAGNOSIS — R1012 Left upper quadrant pain: Secondary | ICD-10-CM | POA: Insufficient documentation

## 2019-02-21 DIAGNOSIS — Z79899 Other long term (current) drug therapy: Secondary | ICD-10-CM | POA: Insufficient documentation

## 2019-02-21 DIAGNOSIS — Z3480 Encounter for supervision of other normal pregnancy, unspecified trimester: Secondary | ICD-10-CM

## 2019-02-21 DIAGNOSIS — W19XXXD Unspecified fall, subsequent encounter: Secondary | ICD-10-CM

## 2019-02-21 DIAGNOSIS — Z3A26 26 weeks gestation of pregnancy: Secondary | ICD-10-CM

## 2019-02-21 DIAGNOSIS — O9A212 Injury, poisoning and certain other consequences of external causes complicating pregnancy, second trimester: Secondary | ICD-10-CM | POA: Diagnosis not present

## 2019-02-21 DIAGNOSIS — O99612 Diseases of the digestive system complicating pregnancy, second trimester: Secondary | ICD-10-CM | POA: Insufficient documentation

## 2019-02-21 DIAGNOSIS — D563 Thalassemia minor: Secondary | ICD-10-CM

## 2019-02-21 DIAGNOSIS — S301XXA Contusion of abdominal wall, initial encounter: Secondary | ICD-10-CM | POA: Diagnosis not present

## 2019-02-21 DIAGNOSIS — O99891 Other specified diseases and conditions complicating pregnancy: Secondary | ICD-10-CM | POA: Diagnosis present

## 2019-02-21 DIAGNOSIS — Z3689 Encounter for other specified antenatal screening: Secondary | ICD-10-CM

## 2019-02-21 DIAGNOSIS — Z3492 Encounter for supervision of normal pregnancy, unspecified, second trimester: Secondary | ICD-10-CM

## 2019-02-21 DIAGNOSIS — R0789 Other chest pain: Secondary | ICD-10-CM

## 2019-02-21 LAB — CBC WITH DIFFERENTIAL/PLATELET
Abs Immature Granulocytes: 0.04 10*3/uL (ref 0.00–0.07)
Basophils Absolute: 0 10*3/uL (ref 0.0–0.1)
Basophils Relative: 0 %
Eosinophils Absolute: 0 10*3/uL (ref 0.0–0.5)
Eosinophils Relative: 1 %
HCT: 35.9 % — ABNORMAL LOW (ref 36.0–46.0)
Hemoglobin: 11 g/dL — ABNORMAL LOW (ref 12.0–15.0)
Immature Granulocytes: 1 %
Lymphocytes Relative: 20 %
Lymphs Abs: 1.6 10*3/uL (ref 0.7–4.0)
MCH: 25.6 pg — ABNORMAL LOW (ref 26.0–34.0)
MCHC: 30.6 g/dL (ref 30.0–36.0)
MCV: 83.7 fL (ref 80.0–100.0)
Monocytes Absolute: 0.4 10*3/uL (ref 0.1–1.0)
Monocytes Relative: 5 %
Neutro Abs: 5.7 10*3/uL (ref 1.7–7.7)
Neutrophils Relative %: 73 %
Platelets: 257 10*3/uL (ref 150–400)
RBC: 4.29 MIL/uL (ref 3.87–5.11)
RDW: 13.8 % (ref 11.5–15.5)
WBC: 7.8 10*3/uL (ref 4.0–10.5)
nRBC: 0 % (ref 0.0–0.2)

## 2019-02-21 LAB — URINALYSIS, ROUTINE W REFLEX MICROSCOPIC
Bacteria, UA: NONE SEEN
Bilirubin Urine: NEGATIVE
Glucose, UA: NEGATIVE mg/dL
Hgb urine dipstick: NEGATIVE
Ketones, ur: 20 mg/dL — AB
Nitrite: NEGATIVE
Protein, ur: 30 mg/dL — AB
Specific Gravity, Urine: 1.024 (ref 1.005–1.030)
pH: 5 (ref 5.0–8.0)

## 2019-02-21 LAB — COMPREHENSIVE METABOLIC PANEL
ALT: 20 U/L (ref 0–44)
AST: 21 U/L (ref 15–41)
Albumin: 3.1 g/dL — ABNORMAL LOW (ref 3.5–5.0)
Alkaline Phosphatase: 155 U/L — ABNORMAL HIGH (ref 38–126)
Anion gap: 10 (ref 5–15)
BUN: 8 mg/dL (ref 6–20)
CO2: 21 mmol/L — ABNORMAL LOW (ref 22–32)
Calcium: 8.9 mg/dL (ref 8.9–10.3)
Chloride: 104 mmol/L (ref 98–111)
Creatinine, Ser: 0.65 mg/dL (ref 0.44–1.00)
GFR calc Af Amer: >60 mL/min (ref >60)
GFR calc non Af Amer: >60 mL/min (ref >60)
Glucose, Bld: 83 mg/dL (ref 70–99)
Potassium: 3.9 mmol/L (ref 3.5–5.1)
Sodium: 135 mmol/L (ref 135–145)
Total Bilirubin: 0.2 mg/dL — ABNORMAL LOW (ref 0.3–1.2)
Total Protein: 7.3 g/dL (ref 6.5–8.1)

## 2019-02-21 LAB — TROPONIN I (HIGH SENSITIVITY)
Troponin I (High Sensitivity): <2 ng/L (ref <18)
Troponin I (High Sensitivity): <2 ng/L (ref <18)

## 2019-02-21 LAB — LIPASE, BLOOD: Lipase: 21 U/L (ref 11–51)

## 2019-02-21 MED ORDER — LACTATED RINGERS IV BOLUS
1000.0000 mL | Freq: Once | INTRAVENOUS | Status: AC
  Administered 2019-02-21: 1000 mL via INTRAVENOUS

## 2019-02-21 MED ORDER — LIDOCAINE VISCOUS HCL 2 % MT SOLN
15.0000 mL | Freq: Once | OROMUCOSAL | Status: AC
  Administered 2019-02-21: 15 mL via ORAL
  Filled 2019-02-21: qty 15

## 2019-02-21 MED ORDER — ALUM & MAG HYDROXIDE-SIMETH 200-200-20 MG/5ML PO SUSP
30.0000 mL | Freq: Once | ORAL | Status: AC
  Administered 2019-02-21: 20:00:00 30 mL via ORAL
  Filled 2019-02-21: qty 30

## 2019-02-21 MED ORDER — FAMOTIDINE 20 MG PO TABS: 20.0000 mg | ORAL_TABLET | Freq: Every day | ORAL | 1 refills | Status: DC

## 2019-02-21 NOTE — ED Notes (Signed)
Attempt to call rapid OB; no answer.

## 2019-02-21 NOTE — MAU Provider Note (Signed)
History     CSN: [id number]  Arrival date and time: 02/21/19 1653   First Provider Initiated Contact with Patient 02/21/19 2159      Chief Complaint  Patient presents with  . Chest Pain  . Fall   18 y.o. [id number] '@26'$ .2 wks presenting from [place] after fall and c/o CP. Pt reports slipping down the steps around 1pm today and landing on right side of abd. She denies LOC or head trauma. Denies VB, LOF, ctx. Reports +FM. She was able to take a nap after. When she woke around 1500 she felt burning in her chest. She had a negative work-up in the ER and Maalox resolved her pain. She was though to have uterine irritability and therefore transferred to MAU. Pt denies cramping or ctx. Only c/o is soreness on her left mid quadrant.   OB History    Gravida  3   Para  0   Term      Preterm      AB  2   Living        SAB  2   TAB      Ectopic      Multiple      Live Births              Past Medical History:  Diagnosis Date  . Rheumatoid arthritis ([place])   . Rheumatoid arthritis [facility])     Past Surgical History:  Procedure Laterality Date  . NO PAST SURGERIES      No family history on file.  Social History   Tobacco Use  . Smoking status: Never Smoker  . Smokeless tobacco: Never Used  Substance Use Topics  . Alcohol use: No  . Drug use: Yes    Types: Marijuana    Comment: pt states years ago    Allergies:  Allergies  Allergen Reactions  . Apple Anaphylaxis and Swelling    Patient's throat swells  . Bee Venom Anaphylaxis and Swelling    Patient's throat swells  . Cinnamon Anaphylaxis and Swelling    Patient's throat swells  . Mango Flavor [Flavoring Agent] Anaphylaxis and Swelling    Patient's throat swells  . Peach [Prunus Persica] Anaphylaxis and Swelling  . Pineapple Anaphylaxis and Swelling    Patient's throat swells  . Tomato Nausea And Vomiting    Medications Prior to Admission  Medication Sig Dispense Refill Last Dose  . Blood Pressure  Monitoring (BLOOD PRESSURE KIT) DEVI 1 Device by Does not apply route daily. ICD 10: Z34.00 1 Device 0   . EPINEPHrine 0.3 mg/0.3 mL IJ SOAJ injection Inject 0.3 mg into the muscle once as needed for anaphylaxis.      . famotidine (PEPCID) 20 MG tablet Take 1 tablet (20 mg total) by mouth 2 (two) times daily. (Patient not taking: Reported on 08/12/2018) 30 tablet 0   . Prenatal MV & Min w/FA-DHA (PRENATAL ADULT GUMMY/DHA/FA) 0.4-25 MG CHEW Chew 1 Dose by mouth daily. 30 tablet 11   . Prenatal Vit-Fe Fumarate-FA (PRENATAL MULTIVITAMIN) TABS tablet Take 1 tablet by mouth daily at 12 noon.     Marland Kitchen Spacer/Aero-Holding Chambers (AEROCHAMBER PLUS WITH MASK) inhaler Use as instructed (Patient not taking: Reported on 02/02/2019) 1 each 0     Review of Systems  Gastrointestinal: Positive for abdominal pain.  Genitourinary: Negative for vaginal bleeding and vaginal discharge.   Physical Exam   Blood pressure 134/82, pulse 98, temperature 98.6 F (37 C), temperature source Oral, resp. rate 17,  height 5' 7"  (1.702 m), weight 98 kg, last menstrual period 08/20/2018, SpO2 99 %.  Physical Exam  Nursing note and vitals reviewed. Constitutional: She is oriented to person, place, and time. She appears well-developed and well-nourished. No distress.  HENT:  Head: Normocephalic.  Neck: Normal range of motion.  Cardiovascular: Normal rate.  Respiratory: Effort normal. No respiratory distress.  GI: Soft. She exhibits no distension.    gravid  Musculoskeletal: Normal range of motion.  Neurological: She is alert and oriented to person, place, and time.  Skin: Skin is warm and dry.  Psychiatric: She has a normal mood and affect.  EFM: 145 bpm, mod variability, + accels, no decels Toco: mild irritability  Results for orders placed or performed during the hospital encounter of 02/21/19 (from the past 24 hour(s))  CBC with Differential     Status: Abnormal   Collection Time: 02/21/19  6:32 PM  Result Value  Ref Range   WBC 7.8 4.0 - 10.5 K/uL   RBC 4.29 3.87 - 5.11 MIL/uL   Hemoglobin 11.0 (L) 12.0 - 15.0 g/dL   HCT 35.9 (L) 36.0 - 46.0 %   MCV 83.7 80.0 - 100.0 fL   MCH 25.6 (L) 26.0 - 34.0 pg   MCHC 30.6 30.0 - 36.0 g/dL   RDW 13.8 11.5 - 15.5 %   Platelets 257 150 - 400 K/uL   nRBC 0.0 0.0 - 0.2 %   Neutrophils Relative % 73 %   Neutro Abs 5.7 1.7 - 7.7 K/uL   Lymphocytes Relative 20 %   Lymphs Abs 1.6 0.7 - 4.0 K/uL   Monocytes Relative 5 %   Monocytes Absolute 0.4 0.1 - 1.0 K/uL   Eosinophils Relative 1 %   Eosinophils Absolute 0.0 0.0 - 0.5 K/uL   Basophils Relative 0 %   Basophils Absolute 0.0 0.0 - 0.1 K/uL   Immature Granulocytes 1 %   Abs Immature Granulocytes 0.04 0.00 - 0.07 K/uL  Comprehensive metabolic panel     Status: Abnormal   Collection Time: 02/21/19  6:32 PM  Result Value Ref Range   Sodium 135 135 - 145 mmol/L   Potassium 3.9 3.5 - 5.1 mmol/L   Chloride 104 98 - 111 mmol/L   CO2 21 (L) 22 - 32 mmol/L   Glucose, Bld 83 70 - 99 mg/dL   BUN 8 6 - 20 mg/dL   Creatinine, Ser 0.65 0.44 - 1.00 mg/dL   Calcium 8.9 8.9 - 10.3 mg/dL   Total Protein 7.3 6.5 - 8.1 g/dL   Albumin 3.1 (L) 3.5 - 5.0 g/dL   AST 21 15 - 41 U/L   ALT 20 0 - 44 U/L   Alkaline Phosphatase 155 (H) 38 - 126 U/L   Total Bilirubin 0.2 (L) 0.3 - 1.2 mg/dL   GFR calc non Af Amer >60 >60 mL/min   GFR calc Af Amer >60 >60 mL/min   Anion gap 10 5 - 15  Lipase, blood     Status: None   Collection Time: 02/21/19  6:32 PM  Result Value Ref Range   Lipase 21 11 - 51 U/L  Troponin I (High Sensitivity)     Status: None   Collection Time: 02/21/19  6:32 PM  Result Value Ref Range   Troponin I (High Sensitivity) <2 <18 ng/L  Urinalysis, Routine w reflex microscopic     Status: Abnormal   Collection Time: 02/21/19  7:34 PM  Result Value Ref Range   Color, Urine  AMBER (A) YELLOW   APPearance CLEAR CLEAR   Specific Gravity, Urine 1.024 1.005 - 1.030   pH 5.0 5.0 - 8.0   Glucose, UA NEGATIVE  NEGATIVE mg/dL   Hgb urine dipstick NEGATIVE NEGATIVE   Bilirubin Urine NEGATIVE NEGATIVE   Ketones, ur 20 (A) NEGATIVE mg/dL   Protein, ur 30 (A) NEGATIVE mg/dL   Nitrite NEGATIVE NEGATIVE   Leukocytes,Ua TRACE (A) NEGATIVE   RBC / HPF 0-5 0 - 5 RBC/hpf   WBC, UA 0-5 0 - 5 WBC/hpf   Bacteria, UA NONE SEEN NONE SEEN   Squamous Epithelial / LPF 0-5 0 - 5   Mucus PRESENT   Troponin I (High Sensitivity)     Status: None   Collection Time: 02/21/19  9:03 PM  Result Value Ref Range   Troponin I (High Sensitivity) <2.00 <18 ng/L   MAU Course  Procedures Prolonged EFM  MDM No evidence of abruption or PTL. Pt reassured and given strict return precautions. Stable for discharge home.   Assessment and Plan  [redacted] weeks gestation S/p fall GERD Discharge home PTL/abruption precautions FMCs Tylenol prn Rx Pepcid  Allergies as of 02/22/2019      Reactions   Apple Anaphylaxis, Swelling   Patient's throat swells   Bee Venom Anaphylaxis, Swelling   Patient's throat swells   Cinnamon Anaphylaxis, Swelling   Patient's throat swells   Mango Flavor [flavoring Agent] Anaphylaxis, Swelling   Patient's throat swells   Peach [prunus Persica] Anaphylaxis, Swelling   Pineapple Anaphylaxis, Swelling   Patient's throat swells   Tomato Nausea And Vomiting      Medication List    STOP taking these medications   prenatal multivitamin Tabs tablet     TAKE these medications   aerochamber plus with mask inhaler Use as instructed   Blood Pressure Kit Devi 1 Device by Does not apply route daily. ICD 10: Z34.00   EPINEPHrine 0.3 mg/0.3 mL Soaj injection Commonly known as: EPI-PEN Inject 0.3 mg into the muscle once as needed for anaphylaxis.   famotidine 20 MG tablet Commonly known as: PEPCID Take 1 tablet (20 mg total) by mouth at bedtime. What changed: when to take this   Prenatal Adult Gummy/DHA/FA 0.4-25 MG Chew Chew 1 Dose by mouth daily.      Julianne Handler, CNM 02/21/2019,  10:27 PM

## 2019-02-21 NOTE — ED Notes (Signed)
[place], MD at bedside.   [facility] at bedside.

## 2019-02-21 NOTE — ED Notes (Signed)
Spoke with [facility] RN who reports they will be on the way to assess patient.

## 2019-02-21 NOTE — Progress Notes (Signed)
1800 Arrived to evaluate this 18 yo G3P0 @ 26.[redacted] wks GA in with report of fall down stairs this afternoon around 1pm. Pt reports hitting right side of abdomen on stairs. Denies vaginal bleeding, LOF, or UCs. Reports some fetal movement although movement is perceived to be less. Patient remained in triage on my arrival and was moved to a bed at 1812.  1836 EFM Category I and appropriate for GA, frequent UI and several UC's noted. Pt reports dehydration and denies need to urinate. Dr. Ilda Basset of FP notified of pt in ED and of previous c/o of CP being worked up by EDP and of c/o falling down the stairs. Informed of above OB assessment.  He request pt be given a 1 L bolus of IVF and be transferred to MAU following completion of medical work up.

## 2019-02-21 NOTE — ED Notes (Signed)
[facility] at bedside 

## 2019-02-21 NOTE — ED Triage Notes (Addendum)
Per EMS pt complaint of burning in her throat/chest after eating pizza last night and [employer] today. Pt is [redacted] weeks pregnant, followed by Phill Myron with [facility].  While triaging pt pt verbalizing fell down the stairs around 1 pm; complaint of pain to left side of abdomen. Denies gush of fluid or vaginal bleeding. Pt verbalizes feels baby movement as normal.

## 2019-02-21 NOTE — Discharge Instructions (Signed)
Heartburn During Pregnancy ° °Heartburn is pain or discomfort in the throat or chest. It may cause a burning feeling. It happens when stomach acid moves up into the tube that carries food from your mouth to your stomach (esophagus). Heartburn is common during pregnancy. It usually goes away or gets better after giving birth. °Follow these instructions at home: °Eating and drinking °· Do not drink alcohol while you are pregnant. °· Figure out which foods and beverages make you feel worse, and avoid them. °· Beverages that you may want to avoid include: °? Coffee and tea (with or without caffeine). °? Energy drinks and sports drinks. °? Bubbly (carbonated) drinks or sodas. °? Citrus fruit juices. °· Foods that you may want to avoid include: °? Chocolate and cocoa. °? Peppermint and mint flavorings. °? Garlic, onions, and horseradish. °? Spicy and acidic foods. These include peppers, chili powder, curry powder, vinegar, hot sauces, and barbecue sauce. °? Citrus fruits, such as oranges, lemons, and limes. °? Tomato-based foods, such as red sauce, chili, and salsa. °? Fried and fatty foods, such as donuts, french fries, potato chips, and high-fat dressings. °? High-fat meats, such as hot dogs, cold cuts, sausage, ham, and bacon. °? High-fat dairy items, such as whole milk, butter, and cheese. °· Eat small meals often, instead of large meals. °· Avoid drinking a lot of liquid with your meals. °· Avoid eating meals during the 2-3 hours before you go to bed. °· Avoid lying down right after you eat. °· Do not exercise right after you eat. °Medicines °· Take over-the-counter and prescription medicines only as told by your doctor. °· Do not take aspirin, ibuprofen, or other NSAIDs unless your doctor tells you to do that. °· Your doctor may tell you to avoid medicines that have sodium bicarbonate in them. °General instructions ° °· If told, raise the head of your bed about 6 inches (15 cm). You can do this by putting blocks  under the legs. Sleeping with more pillows does not help with heartburn. °· Do not use any products that contain nicotine or tobacco, such as cigarettes and e-cigarettes. If you need help quitting, ask your doctor. °· Wear loose-fitting clothing. °· Try to lower your stress, such as with yoga or meditation. If you need help, ask your doctor. °· Stay at a healthy weight. If you are overweight, work with your doctor to safely lose weight. °· Keep all follow-up visits as told by your doctor. This is important. °Contact a doctor if: °· You get new symptoms. °· Your symptoms do not get better with treatment. °· You have weight loss and you do not know why. °· You have trouble swallowing. °· You make loud sounds when you breathe (wheeze). °· You have a cough that does not go away. °· You have heartburn often for more than 2 weeks. °· You feel sick to your stomach (nauseous), and this does not get better with treatment. °· You are throwing up (vomiting), and this does not get better with treatment. °· You have pain in your belly (abdomen). °Get help right away if: °· You have very bad chest pain that spreads to your arm, neck, or jaw. °· You feel sweaty, dizzy, or light-headed. °· You have trouble breathing. °· You have pain when swallowing. °· You throw up and your throw-up looks like blood or coffee grounds. °· Your poop (stool) is bloody or black. °This information is not intended to replace advice given to you by your health   care provider. Make sure you discuss any questions you have with your health care provider. Document Released: 05/17/2010 Document Revised: 08/05/2018 Document Reviewed: 12/31/2015 Elsevier Patient Education  2020 [employer].   Preventing Injuries During Pregnancy  Injuries can happen during pregnancy. Minor falls and accidents usually do not harm you or your baby. But some injuries can harm you and your baby. Tell your doctor about any injury you suffer. What can I do to avoid  injuries? Safety  Remove rugs and loose objects on the floor.  Wear comfortable shoes that have a good grip. Do not wear shoes that have high heels.  Always wear your seat belt in the car. The lap belt should be below your belly. Always drive safely.  Do not ride on a motorcycle. Activity  Do not take part in rough and violent activities or sports.  Avoid: ? Walking on wet or slippery floors. ? Lifting heavy pots of boiling or hot liquids. ? Fixing electrical problems. ? Being near fires. General instructions  Take over-the-counter and prescription medicines only as told by your doctor.  Know your blood type and the blood type of the baby's father.  If you are a victim of domestic violence: ? Call your local emergency services (911 in the [place] ? Contact the [employer] Violence Hotline for help and support. Get help right away if:  You fall on your belly or receive any serious blow to your belly.  You have a stiff neck or neck pain after a fall or an injury.  You get a headache or have problems with vision after an injury.  You do not feel the baby move or the baby is not moving as much as normal.  You have been a victim of domestic violence or any other kind of attack.  You have been in a car accident.  You have bleeding from your vagina.  Fluid is leaking from your vagina.  You start to have cramping or pain in your belly (contractions).  You have very bad pain in your lower back.  You feel weak or pass out (faint).  You start to throw up (vomit) after an injury.  You have been burned. Summary  Some injuries that happen during pregnancy can do harm to the baby.  Tell your doctor about any injury.  Take steps to avoid injury. This includes removing rugs and loose objects on the floor. Always wear your seat belt in the car.  Do not take part in rough and violent activities or sports.  Get help right away if you have any serious accident or  injury. This information is not intended to replace advice given to you by your health care provider. Make sure you discuss any questions you have with your health care provider. Document Released: 05/17/2010 Document Revised: 04/23/2016 Document Reviewed: 04/23/2016 Elsevier Patient Education  2020 [employer].

## 2019-02-21 NOTE — ED Provider Notes (Signed)
[facility] DEPT Provider Note   CSN: [id number] Arrival date & time: 02/21/19  1653     History   Chief Complaint Chief Complaint  Patient presents with  . Chest Pain  . Fall    HPI Bonnie Booth is a 18 y.o. female.     Patient is a 18 year old female with past medical history of rheumatoid arthritis.  Is G3 P0020 at approximately [redacted] weeks gestation.  She presents today with complaints of burning in her throat and chest since last night.  This began after she ate pizza and food from [employer].  This causes her to feel somewhat short of breath.  She denies any fevers or chills or cough.  Patient also reports that she fell while walking downstairs at approximately 1 o'clock.  She landed on her left upper abdomen and describes discomfort in this area.  She denies any vaginal bleeding.  She does feel the baby move.  The history is provided by the patient.  Chest Pain Pain location:  Substernal area Pain quality: burning   Pain radiates to:  Does not radiate Pain severity:  Moderate Onset quality:  Gradual Duration:  2 days Timing:  Constant Progression:  Worsening Chronicity:  New Relieved by:  Nothing Worsened by:  Nothing Ineffective treatments:  None tried   Past Medical History:  Diagnosis Date  . Rheumatoid arthritis ([place])   . Rheumatoid arthritis [facility])     Patient Active Problem List   Diagnosis Date Noted  . Alpha thalassemia silent carrier 12/02/2018  . Supervision of other normal pregnancy, antepartum 11/04/2018    Past Surgical History:  Procedure Laterality Date  . NO PAST SURGERIES       OB History    Gravida  3   Para  0   Term      Preterm      AB  2   Living        SAB  2   TAB      Ectopic      Multiple      Live Births               Home Medications    Prior to Admission medications   Medication Sig Start Date End Date Taking? Authorizing Provider  Blood Pressure Monitoring  (BLOOD PRESSURE KIT) DEVI 1 Device by Does not apply route daily. ICD 10: Z34.00 12/02/18   Rasch, Anderson Malta I, NP  EPINEPHrine 0.3 mg/0.3 mL IJ SOAJ injection Inject 0.3 mg into the muscle once as needed for anaphylaxis.  02/07/18   [provider]  famotidine (PEPCID) 20 MG tablet Take 1 tablet (20 mg total) by mouth 2 (two) times daily. Patient not taking: Reported on 08/12/2018 10/30/17   Isla Pence, MD  Prenatal MV & Min w/FA-DHA (PRENATAL ADULT GUMMY/DHA/FA) 0.4-25 MG CHEW Chew 1 Dose by mouth daily. 11/04/18   Nicolette Bang, DO  Prenatal Vit-Fe Fumarate-FA (PRENATAL MULTIVITAMIN) TABS tablet Take 1 tablet by mouth daily at 12 noon.    [provider]  Spacer/Aero-Holding Chambers (AEROCHAMBER PLUS WITH MASK) inhaler Use as instructed Patient not taking: Reported on 02/02/2019 06/07/15   Street, Evaro, PA-C    Family History No family history on file.  Social History Social History   Tobacco Use  . Smoking status: Never Smoker  . Smokeless tobacco: Never Used  Substance Use Topics  . Alcohol use: No  . Drug use: Yes    Types: Marijuana  Comment: pt states years ago     Allergies   Apple, Bee venom, Cinnamon, Mango flavor [flavoring agent], Peach [prunus persica], Pineapple, and Tomato   Review of Systems Review of Systems  All other systems reviewed and are negative.    Physical Exam Updated Vital Signs BP 137/73 (BP Location: Right Arm)   Pulse (!) 109   Temp 98.6 F (37 C) (Oral)   Resp 16   Ht 5' 7" (1.702 m)   Wt 98 kg   LMP 08/20/2018   SpO2 100%   BMI 33.83 kg/m   Physical Exam Vitals signs and nursing note reviewed.  Constitutional:      General: She is not in acute distress.    Appearance: She is well-developed. She is not diaphoretic.  HENT:     Head: Normocephalic and atraumatic.  Neck:     Musculoskeletal: Normal range of motion and neck supple.  Cardiovascular:     Rate and Rhythm: Normal rate and regular  rhythm.     Heart sounds: No murmur. No friction rub. No gallop.   Pulmonary:     Effort: Pulmonary effort is normal. No respiratory distress.     Breath sounds: Normal breath sounds. No wheezing.  Abdominal:     General: Bowel sounds are normal. There is no distension.     Palpations: Abdomen is soft. There is no mass.     Tenderness: There is abdominal tenderness. There is no guarding or rebound.     Comments: Gravid uterus noted.  No external signs of trauma.  There is mild ttp to the left upper quadrant.  Musculoskeletal: Normal range of motion.  Skin:    General: Skin is warm and dry.  Neurological:     Mental Status: She is alert and oriented to person, place, and time.      ED Treatments / Results  Labs (all labs ordered are listed, but only abnormal results are displayed) Labs Reviewed  CBC WITH DIFFERENTIAL/PLATELET  COMPREHENSIVE METABOLIC PANEL  LIPASE, BLOOD  TROPONIN I (HIGH SENSITIVITY)    EKG EKG Interpretation  Date/Time:  Monday February 21 2019 17:10:47 EDT Ventricular Rate:  106 PR Interval:    QRS Duration: 87 QT Interval:  323 QTC Calculation: 429 R Axis:   28 Text Interpretation: Sinus tachycardia Low voltage, precordial leads Borderline T wave abnormalities t wave changes improved since last tracing Confirmed by Dorie Rank [phone]) on 02/21/2019 5:19:34 PM   Radiology No results found.  Procedures Procedures (including critical care time)  Medications Ordered in ED Medications - No data to display   Initial Impression / Assessment and Plan / ED Course  I have reviewed the triage vital signs and the nursing notes.  Pertinent labs & imaging results that were available during my care of the patient were reviewed by me and considered in my medical decision making (see chart for details).  Patient is a G3 P0-0-2-0 at approximately [redacted] weeks gestation presenting with complaints of chest discomfort.  She describes this as a burning and it is worse  when she eats.  Her symptoms are most consistent with reflux.  She was given a GI cocktail with some relief.  Her laboratory studies show no evidence of any significant abnormality.  Her hemoglobin is 11 which is consistent with baseline.  Troponin is negative.  EKG shows nonspecific T wave changes consistent with prior EKGs.  Patient also seen by rapid response OB due to a reported fall at home this morning.  She has mild tenderness to the left upper quadrant.  She has been monitored here in the ER and her fetal heart rate monitoring has been reassuring.  It does appear as though she is having small contractions for which rapid response OB would like to have the patient transferred to womens.  Dr. Ihor Dow aware and accepts in transfer.  CRITICAL CARE Performed by: Veryl Speak Total critical care time: 35 minutes Critical care time was exclusive of separately billable procedures and treating other patients. Critical care was necessary to treat or prevent imminent or life-threatening deterioration. Critical care was time spent personally by me on the following activities: development of treatment plan with patient and/or surrogate as well as nursing, discussions with consultants, evaluation of patient's response to treatment, examination of patient, obtaining history from patient or surrogate, ordering and performing treatments and interventions, ordering and review of laboratory studies, ordering and review of radiographic studies, pulse oximetry and re-evaluation of patient's condition.   Final Clinical Impressions(s) / ED Diagnoses   Final diagnoses:  None    ED Discharge Orders    None       Veryl Speak, MD 02/21/19 2022

## 2019-02-21 NOTE — ED Notes (Addendum)
[facility] contacted and paperwork printed  

## 2019-02-21 NOTE — Progress Notes (Addendum)
Arrived at 1845 to relieve day shift [facility].  Current FHR tracing cat 1 and appropriate for GA, UI noted.  Pt reports no cramping or contraction pain but still has pain in abdomen where she fell.   ED RN started LR bolus at 1904; uterine irritability resolved by 1930 after hydration.  Pt medically cleared by EDP, [facility] called for transfer and MAU called to give report.  2100 - monitors removed, category one tracing with minimal uterine irritability. Pt received by [facility] for transfer to MAU per Dr. Ilda Basset recommendation.

## 2019-02-21 NOTE — ED Notes (Signed)
Report given to [facility] by [facility] nurse

## 2019-02-24 ENCOUNTER — Other Ambulatory Visit: Payer: Self-pay | Admitting: Lactation Services

## 2019-02-24 DIAGNOSIS — Z3480 Encounter for supervision of other normal pregnancy, unspecified trimester: Secondary | ICD-10-CM

## 2019-03-03 ENCOUNTER — Other Ambulatory Visit: Payer: Medicaid Other

## 2019-03-03 ENCOUNTER — Encounter: Payer: Medicaid Other | Admitting: Student

## 2019-03-03 ENCOUNTER — Encounter: Payer: Self-pay | Admitting: Student

## 2019-03-08 ENCOUNTER — Other Ambulatory Visit: Payer: Medicaid Other

## 2019-03-08 ENCOUNTER — Encounter: Payer: Medicaid Other | Admitting: Student

## 2019-03-14 ENCOUNTER — Other Ambulatory Visit: Payer: Self-pay | Admitting: *Deleted

## 2019-03-14 ENCOUNTER — Telehealth: Payer: Self-pay | Admitting: Student

## 2019-03-14 DIAGNOSIS — Z3480 Encounter for supervision of other normal pregnancy, unspecified trimester: Secondary | ICD-10-CM

## 2019-03-14 NOTE — Telephone Encounter (Signed)
Spoke to patient about her appointment on 11/17 @ 8:40. Patient instructed to wear a face mask for the entire appointment and no visitors are allowed with her during the visit. Patient screened for covid symptoms and denied having any. Patient instructed to come fasting.

## 2019-03-15 ENCOUNTER — Other Ambulatory Visit: Payer: Medicaid Other

## 2019-03-15 ENCOUNTER — Ambulatory Visit (INDEPENDENT_AMBULATORY_CARE_PROVIDER_SITE_OTHER): Payer: Medicaid Other | Admitting: Student

## 2019-03-15 ENCOUNTER — Other Ambulatory Visit: Payer: Self-pay

## 2019-03-15 VITALS — BP 115/82 | HR 106 | Temp 98.5°F | Wt 220.2 lb

## 2019-03-15 DIAGNOSIS — Z3483 Encounter for supervision of other normal pregnancy, third trimester: Secondary | ICD-10-CM

## 2019-03-15 DIAGNOSIS — Z3480 Encounter for supervision of other normal pregnancy, unspecified trimester: Secondary | ICD-10-CM

## 2019-03-15 DIAGNOSIS — Z3A29 29 weeks gestation of pregnancy: Secondary | ICD-10-CM

## 2019-03-15 DIAGNOSIS — Z23 Encounter for immunization: Secondary | ICD-10-CM

## 2019-03-15 NOTE — Progress Notes (Signed)
   PRENATAL VISIT NOTE  Subjective:  Bonnie Booth is a 18 y.o. G3P0020 at [redacted]w[redacted]d being seen today for ongoing prenatal care.  She is currently monitored for the following issues for this low-risk pregnancy and has Supervision of other normal pregnancy, antepartum and Alpha thalassemia silent carrier on their problem list.  Patient reports no complaints. She reports that she feels tired and sleepy. She reports that her eating level is "off". She is skipping breakfast. She gets up after noon.  Contractions: Not present. Vag. Bleeding: None.  Movement: Present. Denies leaking of fluid.   The following portions of the patient's history were reviewed and updated as appropriate: allergies, current medications, past family history, past medical history, past social history, past surgical history and problem list.   Objective:   Vitals:   03/15/19 0948  BP: 115/82  Pulse: (!) 106  Temp: 98.5 F (36.9 C)  Weight: 220 lb 3.2 oz (99.9 kg)    Fetal Status: Fetal Heart Rate (bpm): 150 Fundal Height: 31 cm Movement: Present     General:  Alert, oriented and cooperative. Patient is in no acute distress.  Skin: Skin is warm and dry. No rash noted.   Cardiovascular: Normal heart rate noted  Respiratory: Normal respiratory effort, no problems with respiration noted  Abdomen: Soft, gravid, appropriate for gestational age.  Pain/Pressure: Absent     Pelvic: Cervical exam deferred        Extremities: Normal range of motion.  Edema: Trace  Mental Status: Normal mood and affect. Normal behavior. Normal judgment and thought content.   Assessment and Plan:  Pregnancy: [id number] at [redacted]w[redacted]d 1. Supervision of other normal pregnancy, antepartum -2 hour in process -Patient has MyChart; knows that her next visit will be on My chart in 3 weeks.  -Patient has BP cuff at home; she agrees to check weekly and record in her phone. Detailed instructions on symptoms and what to do if she has elevated readings with or  without symptoms.  -Unable to register for Baby[facility], will ask CMA to re-send - Tdap vaccine greater than or equal to 7yo IM -FOB will get genetic testing.   Preterm labor symptoms and general obstetric precautions including but not limited to vaginal bleeding, contractions, leaking of fluid and fetal movement were reviewed in detail with the patient. Please refer to After Visit Summary for other counseling recommendations.   Return in about 3 weeks (around 04/05/2019), or LROB on My Chart.  No future appointments.  Starr Lake, CNM

## 2019-03-15 NOTE — Patient Instructions (Addendum)
-  Take blood pressure once a week. If top number is 140 or greater or bottom number is 90 or greater, stop, rest, and recheck it again 15 min later or if you have any nausea, vomiting, blurry vision, headache, pain under your ribs on the right side, come to the Maternity Assessment Unit at the [facility] and [place].  -

## 2019-03-16 ENCOUNTER — Telehealth: Payer: Self-pay | Admitting: *Deleted

## 2019-03-16 ENCOUNTER — Encounter: Payer: Self-pay | Admitting: Student

## 2019-03-16 DIAGNOSIS — O24419 Gestational diabetes mellitus in pregnancy, unspecified control: Secondary | ICD-10-CM | POA: Insufficient documentation

## 2019-03-16 LAB — CBC
Hematocrit: 31.4 % — ABNORMAL LOW (ref 34.0–46.6)
Hemoglobin: 9.8 g/dL — ABNORMAL LOW (ref 11.1–15.9)
MCH: 24.5 pg — ABNORMAL LOW (ref 26.6–33.0)
MCHC: 31.2 g/dL — ABNORMAL LOW (ref 31.5–35.7)
MCV: 79 fL (ref 79–97)
Platelets: 262 10*3/uL (ref 150–450)
RBC: 4 x10E6/uL (ref 3.77–5.28)
RDW: 13.2 % (ref 11.7–15.4)
WBC: 7.4 10*3/uL (ref 3.4–10.8)

## 2019-03-16 LAB — COMPREHENSIVE METABOLIC PANEL
ALT: 16 IU/L (ref 0–32)
AST: 19 IU/L (ref 0–40)
Albumin/Globulin Ratio: 1.3 (ref 1.2–2.2)
Albumin: 3.6 g/dL — ABNORMAL LOW (ref 3.9–5.0)
Alkaline Phosphatase: 211 IU/L — ABNORMAL HIGH (ref 39–117)
BUN/Creatinine Ratio: 12 (ref 9–23)
BUN: 8 mg/dL (ref 6–20)
Bilirubin Total: 0.2 mg/dL (ref 0.0–1.2)
CO2: 18 mmol/L — ABNORMAL LOW (ref 20–29)
Calcium: 9.2 mg/dL (ref 8.7–10.2)
Chloride: 99 mmol/L (ref 96–106)
Creatinine, Ser: 0.66 mg/dL (ref 0.57–1.00)
GFR calc Af Amer: 147 mL/min/{1.73_m2} (ref >59)
GFR calc non Af Amer: 128 mL/min/{1.73_m2} (ref >59)
Globulin, Total: 2.8 g/dL (ref 1.5–4.5)
Glucose: 175 mg/dL — ABNORMAL HIGH (ref 65–99)
Potassium: 4.2 mmol/L (ref 3.5–5.2)
Sodium: 135 mmol/L (ref 134–144)
Total Protein: 6.4 g/dL (ref 6.0–8.5)

## 2019-03-16 LAB — GLUCOSE TOLERANCE, 2 HOURS W/ 1HR
Glucose, 1 hour: 189 mg/dL — ABNORMAL HIGH (ref 65–179)
Glucose, 2 hour: 138 mg/dL (ref 65–152)
Glucose, Fasting: 96 mg/dL — ABNORMAL HIGH (ref 65–91)

## 2019-03-16 LAB — RPR: RPR Ser Ql: NONREACTIVE

## 2019-03-16 LAB — HIV ANTIBODY (ROUTINE TESTING W REFLEX): HIV Screen 4th Generation wRfx: NONREACTIVE

## 2019-03-16 MED ORDER — ACCU-CHEK GUIDE W/DEVICE KIT: 1.0000 | PACK | Freq: Once | 0 refills | Status: AC

## 2019-03-16 MED ORDER — ACCU-CHEK GUIDE VI STRP: ORAL_STRIP | 12 refills | Status: DC

## 2019-03-16 MED ORDER — ACCU-CHEK FASTCLIX LANCETS MISC: 1.0000 | Freq: Four times a day (QID) | 12 refills | Status: DC

## 2019-03-16 NOTE — Telephone Encounter (Signed)
-----   Message from Starr Lake, [place] sent at 03/16/2019 10:41 AM EST ----- Patient failed her 2 hour GTT; she needs to be set up with appt with Bev, test strips, etc.

## 2019-03-16 NOTE — Telephone Encounter (Signed)
I called and notified [facility] she did not pass 2  Hr gtt screen and she has GDM. I explained registrars will contact her with an appointment with our Diabetes educator . I explained I will send supplies to her pharmacy to check her blood sugar.I asked her to bring those with her to her appointment with Diabetic educator and she will show her how to use those. I also informed her that her next ob fu will change to be with MD since she has GDM. She voices understanding. Linda,RN

## 2019-04-05 ENCOUNTER — Telehealth: Payer: Medicaid Other | Admitting: Student

## 2019-04-05 ENCOUNTER — Other Ambulatory Visit: Payer: Self-pay

## 2019-04-05 ENCOUNTER — Telehealth (INDEPENDENT_AMBULATORY_CARE_PROVIDER_SITE_OTHER): Payer: Medicaid Other | Admitting: Family Medicine

## 2019-04-05 DIAGNOSIS — O24419 Gestational diabetes mellitus in pregnancy, unspecified control: Secondary | ICD-10-CM

## 2019-04-05 DIAGNOSIS — K219 Gastro-esophageal reflux disease without esophagitis: Secondary | ICD-10-CM

## 2019-04-05 DIAGNOSIS — Z3480 Encounter for supervision of other normal pregnancy, unspecified trimester: Secondary | ICD-10-CM

## 2019-04-05 DIAGNOSIS — D563 Thalassemia minor: Secondary | ICD-10-CM

## 2019-04-05 DIAGNOSIS — O99013 Anemia complicating pregnancy, third trimester: Secondary | ICD-10-CM

## 2019-04-05 DIAGNOSIS — Z3A32 32 weeks gestation of pregnancy: Secondary | ICD-10-CM

## 2019-04-05 MED ORDER — FERROUS GLUCONATE 324 (38 FE) MG PO TABS: 324.0000 mg | ORAL_TABLET | Freq: Two times a day (BID) | ORAL | 3 refills | Status: DC

## 2019-04-05 MED ORDER — FAMOTIDINE 20 MG PO TABS: 20.0000 mg | ORAL_TABLET | Freq: Two times a day (BID) | ORAL | 3 refills | Status: DC

## 2019-04-05 NOTE — Progress Notes (Signed)
Pt states she left her BP cuff at her mother's house and has not taken regularly. Also she cannot log the results to [facility]. Pt states she lost Rx for Prilosec given in October and her heartburn has gotten really bad - Tums do not work.

## 2019-04-05 NOTE — Addendum Note (Signed)
Addended by: Langston Reusing on: 04/05/2019 02:50 PM   Modules accepted: Orders

## 2019-04-05 NOTE — Progress Notes (Signed)
   TELEHEALTH OBSTETRICS PRENATAL VIRTUAL VIDEO VISIT ENCOUNTER NOTE  Provider location: Center for [facility] at [place]   I connected with Bonnie Booth on 04/05/19 at 11:15 AM EST by MyChart Video Encounter at home and verified that I am speaking with the correct person using two identifiers.   I discussed the limitations, risks, security and privacy concerns of performing an evaluation and management service virtually and the availability of in person appointments. I also discussed with the patient that there may be a patient responsible charge related to this service. The patient expressed understanding and agreed to proceed. Subjective:  Bonnie Booth is a 18 y.o. G3P0020 at [redacted]w[redacted]d being seen today for ongoing prenatal care.  She is currently monitored for the following issues for this high-risk pregnancy and has Supervision of other normal pregnancy, antepartum; Alpha thalassemia silent carrier; and Gestational diabetes on their problem list.  Patient reports heartburn.  Contractions: Not present. Vag. Bleeding: None.  Movement: Present. Denies any leaking of fluid.   The following portions of the patient's history were reviewed and updated as appropriate: allergies, current medications, past family history, past medical history, past social history, past surgical history and problem list.   Objective:  There were no vitals filed for this visit.  Fetal Status:     Movement: Present     General:  Alert, oriented and cooperative. Patient is in no acute distress.  Respiratory: Normal respiratory effort, no problems with respiration noted  Mental Status: Normal mood and affect. Normal behavior. Normal judgment and thought content.  Rest of physical exam deferred due to type of encounter  Imaging: No results found.  Assessment and Plan:  Pregnancy: [id number] at [redacted]w[redacted]d 1. Supervision of other normal pregnancy, antepartum Good fetal movement  2. Gestational diabetes mellitus  (GDM), antepartum, gestational diabetes method of control unspecified Has diabetes education appt with Bev  3. Alpha thalassemia silent carrier   4. Gastroesophageal reflux disease, unspecified whether esophagitis present Pepcid prescried  5. Anemia of pregnancy Start iron supplement  Preterm labor symptoms and general obstetric precautions including but not limited to vaginal bleeding, contractions, leaking of fluid and fetal movement were reviewed in detail with the patient. I discussed the assessment and treatment plan with the patient. The patient was provided an opportunity to ask questions and all were answered. The patient agreed with the plan and demonstrated an understanding of the instructions. The patient was advised to call back or seek an in-person office evaluation/go to MAU at [facility] for any urgent or concerning symptoms. Please refer to After Visit Summary for other counseling recommendations.   I provided 10 minutes of face-to-face time during this encounter.  Return in about 2 weeks (around 04/19/2019) for HR OB f/u.  Future Appointments  Date Time Provider [place]  04/07/2019  8:15 AM [place] for [facility], [place]

## 2019-04-07 ENCOUNTER — Other Ambulatory Visit: Payer: Medicaid Other

## 2019-04-13 ENCOUNTER — Encounter: Payer: Self-pay | Admitting: Obstetrics & Gynecology

## 2019-04-13 ENCOUNTER — Other Ambulatory Visit: Payer: Medicaid Other

## 2019-04-20 ENCOUNTER — Encounter: Payer: Self-pay | Admitting: Family Medicine

## 2019-04-20 ENCOUNTER — Encounter: Payer: Medicaid Other | Admitting: Family Medicine

## 2019-04-20 ENCOUNTER — Telehealth: Payer: Self-pay | Admitting: Family Medicine

## 2019-04-20 NOTE — Telephone Encounter (Signed)
Attempted to contact about her missed ob appointment. No answer, left voicemail for patient to give the office a call back to be rescheduled. No show letter mailed.

## 2019-04-20 NOTE — Progress Notes (Signed)
Patient did not keep appointment today. She will be called to reschedule.  

## 2019-04-27 ENCOUNTER — Telehealth: Payer: Self-pay | Admitting: General Practice

## 2019-04-27 NOTE — Telephone Encounter (Signed)
Patient called and left message on nurse voicemail line stating she has some questions about her pregnancy would like a call back.   Called patient and she asked if she could be induced at 37 weeks. Told patient we would induce her since she has diabetes but I wasn't sure when that would be. Recommended she ask a provider at her next visit. Patient verbalized understanding & asked if she would have another ultrasound. Told patient I wasn't sure about that either and she would need to ask a provider at her next visit. Recommended she call in the morning to schedule a visit since we haven't seen her since the 8th. Patient verbalized understanding & had no questions.

## 2019-04-28 ENCOUNTER — Other Ambulatory Visit: Payer: Medicaid Other

## 2019-05-15 ENCOUNTER — Inpatient Hospital Stay (HOSPITAL_COMMUNITY)
Admission: AD | Admit: 2019-05-15 | Discharge: 2019-05-15 | Disposition: A | Discharge status: Home / Self Care | Payer: Medicaid Other | Attending: Obstetrics & Gynecology | Admitting: Obstetrics & Gynecology

## 2019-05-15 ENCOUNTER — Inpatient Hospital Stay (HOSPITAL_COMMUNITY): Admit: 2019-05-15 | Payer: Medicaid Other

## 2019-05-15 ENCOUNTER — Other Ambulatory Visit: Payer: Self-pay

## 2019-05-15 ENCOUNTER — Encounter (HOSPITAL_COMMUNITY): Payer: Self-pay | Admitting: Obstetrics & Gynecology

## 2019-05-15 DIAGNOSIS — O471 False labor at or after 37 completed weeks of gestation: Secondary | ICD-10-CM | POA: Diagnosis not present

## 2019-05-15 DIAGNOSIS — O24419 Gestational diabetes mellitus in pregnancy, unspecified control: Secondary | ICD-10-CM | POA: Insufficient documentation

## 2019-05-15 DIAGNOSIS — O479 False labor, unspecified: Secondary | ICD-10-CM

## 2019-05-15 DIAGNOSIS — Z3A38 38 weeks gestation of pregnancy: Secondary | ICD-10-CM | POA: Insufficient documentation

## 2019-05-15 DIAGNOSIS — O24410 Gestational diabetes mellitus in pregnancy, diet controlled: Secondary | ICD-10-CM

## 2019-05-15 DIAGNOSIS — R109 Unspecified abdominal pain: Secondary | ICD-10-CM | POA: Diagnosis present

## 2019-05-15 LAB — URINALYSIS, ROUTINE W REFLEX MICROSCOPIC
Bilirubin Urine: NEGATIVE
Glucose, UA: NEGATIVE mg/dL
Hgb urine dipstick: NEGATIVE
Ketones, ur: NEGATIVE mg/dL
Nitrite: NEGATIVE
Protein, ur: 30 mg/dL — AB
Specific Gravity, Urine: 1.024 (ref 1.005–1.030)
pH: 6 (ref 5.0–8.0)

## 2019-05-15 LAB — GLUCOSE, CAPILLARY: Glucose-Capillary: 81 mg/dL (ref 70–99)

## 2019-05-15 LAB — OB RESULTS CONSOLE GBS: GBS: NEGATIVE

## 2019-05-15 NOTE — MAU Note (Signed)
Pt here with complaints of constant pelvic and back pain for about 1 week. Pt also reports swelling in both feet. Pt denies vaginal bleeding or LOF. Reports good fetal movement. She reports she has missed several appointments.

## 2019-05-15 NOTE — Progress Notes (Signed)
  S: [name] is a 19 y.o. G3P0020 at [redacted]w[redacted]d  who presents to MAU today complaining contractions intermittently for last week. She denies vaginal bleeding. She denies LOF. She reports normal fetal movement.    O: BP 133/72 (BP Location: Right Arm)   Pulse 91   Temp 97.6 F (36.4 C) (Oral)   Resp 20   Ht 5\' 7"  (1.702 m)   Wt 108.5 kg   LMP 08/20/2018   SpO2 100%   BMI 37.46 kg/m   Cervical exam:  Dilation: 3 Effacement (%): 50 Cervical Position: Posterior Station: -3 Exam by:: [email or web address], RN   Fetal Monitoring: Baseline: 140 Variability: moderate Accelerations: pos Decelerations: possibly one late decel at 2103 Contractions: Infrequent; q4-84m   A: SIUP at [redacted]w[redacted]d  False labor  GDMA1  P: Will discharge home with return precautions. No cervical change > 1 hour with infrequent ctx. Possible late decel when monitors first applied but Cat I thereafter for > 1 hour. Patient has also missed several appts but would like to be seen this week; message sent to [facility] to request appt. GDMA1. Patient has not been checking sugars. POC glucose 81. GBS cx also drawn as patient missed 36w appt. Likely needs induction around 39-40w.  [facility], MD 05/15/2019 10:23 PM

## 2019-05-15 NOTE — MAU Note (Signed)
Patient reports she hasn't been seen for prenatal appointments since 36 weeks. patient wants to get checked out and make sure everything is ok.

## 2019-05-16 ENCOUNTER — Encounter: Payer: Self-pay | Admitting: *Deleted

## 2019-05-17 ENCOUNTER — Other Ambulatory Visit: Payer: Self-pay | Admitting: Advanced Practice Midwife

## 2019-05-17 ENCOUNTER — Telehealth (HOSPITAL_COMMUNITY): Payer: Self-pay | Admitting: *Deleted

## 2019-05-17 ENCOUNTER — Encounter (HOSPITAL_COMMUNITY): Payer: Self-pay | Admitting: *Deleted

## 2019-05-17 ENCOUNTER — Encounter: Payer: Medicaid Other | Admitting: Obstetrics & Gynecology

## 2019-05-17 ENCOUNTER — Telehealth: Payer: Self-pay | Admitting: General Practice

## 2019-05-17 LAB — CULTURE, BETA STREP (GROUP B ONLY)

## 2019-05-17 NOTE — Telephone Encounter (Signed)
Scheduled IOL 1/23. Called patient and reviewed IOL information. Patient verbalized understanding.

## 2019-05-17 NOTE — Progress Notes (Deleted)
   Patient did not show up today for her scheduled appointment.  She will be called and scheduled for IOL at 39 weeks due to noncompliant GDM.  Jaynie Collins, MD, FACOG Obstetrician & Gynecologist, [facility] for [employer], [facility]

## 2019-05-17 NOTE — Telephone Encounter (Signed)
Preadmission screen  

## 2019-05-19 ENCOUNTER — Other Ambulatory Visit: Payer: Self-pay

## 2019-05-19 ENCOUNTER — Inpatient Hospital Stay (HOSPITAL_COMMUNITY)
Admission: AD | Admit: 2019-05-19 | Discharge: 2019-05-21 | DRG: 807 | Disposition: A | Discharge status: Home / Self Care | Payer: Medicaid Other | Attending: Obstetrics & Gynecology | Admitting: Obstetrics & Gynecology

## 2019-05-19 ENCOUNTER — Other Ambulatory Visit (HOSPITAL_COMMUNITY): Payer: Medicaid Other

## 2019-05-19 ENCOUNTER — Inpatient Hospital Stay (HOSPITAL_COMMUNITY): Payer: Medicaid Other | Admitting: Anesthesiology

## 2019-05-19 ENCOUNTER — Ambulatory Visit (INDEPENDENT_AMBULATORY_CARE_PROVIDER_SITE_OTHER): Payer: Medicaid Other | Admitting: Obstetrics & Gynecology

## 2019-05-19 ENCOUNTER — Encounter (HOSPITAL_COMMUNITY): Payer: Self-pay | Admitting: Obstetrics & Gynecology

## 2019-05-19 VITALS — BP 138/90 | HR 89 | Wt 224.5 lb

## 2019-05-19 DIAGNOSIS — O24420 Gestational diabetes mellitus in childbirth, diet controlled: Secondary | ICD-10-CM | POA: Diagnosis present

## 2019-05-19 DIAGNOSIS — Z20822 Contact with and (suspected) exposure to covid-19: Secondary | ICD-10-CM | POA: Diagnosis present

## 2019-05-19 DIAGNOSIS — Z30430 Encounter for insertion of intrauterine contraceptive device: Secondary | ICD-10-CM

## 2019-05-19 DIAGNOSIS — O9962 Diseases of the digestive system complicating childbirth: Secondary | ICD-10-CM | POA: Diagnosis present

## 2019-05-19 DIAGNOSIS — D563 Thalassemia minor: Secondary | ICD-10-CM | POA: Diagnosis present

## 2019-05-19 DIAGNOSIS — O24419 Gestational diabetes mellitus in pregnancy, unspecified control: Secondary | ICD-10-CM

## 2019-05-19 DIAGNOSIS — D649 Anemia, unspecified: Secondary | ICD-10-CM | POA: Diagnosis present

## 2019-05-19 DIAGNOSIS — O1404 Mild to moderate pre-eclampsia, complicating childbirth: Secondary | ICD-10-CM | POA: Diagnosis not present

## 2019-05-19 DIAGNOSIS — Z3A38 38 weeks gestation of pregnancy: Secondary | ICD-10-CM

## 2019-05-19 DIAGNOSIS — O9902 Anemia complicating childbirth: Secondary | ICD-10-CM | POA: Diagnosis present

## 2019-05-19 DIAGNOSIS — O1490 Unspecified pre-eclampsia, unspecified trimester: Secondary | ICD-10-CM

## 2019-05-19 DIAGNOSIS — Z3480 Encounter for supervision of other normal pregnancy, unspecified trimester: Secondary | ICD-10-CM

## 2019-05-19 DIAGNOSIS — K219 Gastro-esophageal reflux disease without esophagitis: Secondary | ICD-10-CM | POA: Diagnosis present

## 2019-05-19 DIAGNOSIS — O134 Gestational [pregnancy-induced] hypertension without significant proteinuria, complicating childbirth: Principal | ICD-10-CM | POA: Diagnosis present

## 2019-05-19 HISTORY — DX: Unspecified pre-eclampsia, unspecified trimester: O14.90

## 2019-05-19 HISTORY — DX: Anemia, unspecified: D64.9

## 2019-05-19 HISTORY — DX: Essential (primary) hypertension: I10

## 2019-05-19 LAB — CBC
HCT: 33.7 % — ABNORMAL LOW (ref 36.0–46.0)
Hemoglobin: 9.9 g/dL — ABNORMAL LOW (ref 12.0–15.0)
MCH: 23.3 pg — ABNORMAL LOW (ref 26.0–34.0)
MCHC: 29.4 g/dL — ABNORMAL LOW (ref 30.0–36.0)
MCV: 79.3 fL — ABNORMAL LOW (ref 80.0–100.0)
Platelets: 295 10*3/uL (ref 150–400)
RBC: 4.25 MIL/uL (ref 3.87–5.11)
RDW: 15 % (ref 11.5–15.5)
WBC: 6.5 10*3/uL (ref 4.0–10.5)
nRBC: 0 % (ref 0.0–0.2)

## 2019-05-19 LAB — COMPREHENSIVE METABOLIC PANEL
ALT: 10 U/L (ref 0–44)
AST: 15 U/L (ref 15–41)
Albumin: 2.5 g/dL — ABNORMAL LOW (ref 3.5–5.0)
Alkaline Phosphatase: 134 U/L — ABNORMAL HIGH (ref 38–126)
Anion gap: 11 (ref 5–15)
BUN: 11 mg/dL (ref 6–20)
CO2: 21 mmol/L — ABNORMAL LOW (ref 22–32)
Calcium: 9 mg/dL (ref 8.9–10.3)
Chloride: 101 mmol/L (ref 98–111)
Creatinine, Ser: 0.85 mg/dL (ref 0.44–1.00)
GFR calc Af Amer: >60 mL/min (ref >60)
GFR calc non Af Amer: >60 mL/min (ref >60)
Glucose, Bld: 84 mg/dL (ref 70–99)
Potassium: 4.6 mmol/L (ref 3.5–5.1)
Sodium: 133 mmol/L — ABNORMAL LOW (ref 135–145)
Total Bilirubin: 0.3 mg/dL (ref 0.3–1.2)
Total Protein: 6.4 g/dL — ABNORMAL LOW (ref 6.5–8.1)

## 2019-05-19 LAB — PROTEIN / CREATININE RATIO, URINE
Creatinine, Urine: 167.4 mg/dL
Protein Creatinine Ratio: 0.38 mg/mg{Cre} — ABNORMAL HIGH (ref 0.00–0.15)
Total Protein, Urine: 64 mg/dL

## 2019-05-19 LAB — GLUCOSE, CAPILLARY
Glucose-Capillary: 78 mg/dL (ref 70–99)
Glucose-Capillary: 71 mg/dL (ref 70–99)
Glucose-Capillary: 94 mg/dL (ref 70–99)

## 2019-05-19 LAB — TYPE AND SCREEN
ABO/RH(D): O POS
Antibody Screen: NEGATIVE

## 2019-05-19 LAB — ABO/RH: ABO/RH(D): O POS

## 2019-05-19 LAB — SARS CORONAVIRUS 2 (TAT 6-24 HRS): SARS Coronavirus 2: NEGATIVE

## 2019-05-19 MED ORDER — LACTATED RINGERS IV SOLN
500.0000 mL | Freq: Once | INTRAVENOUS | Status: AC
  Administered 2019-05-19: 500 mL via INTRAVENOUS

## 2019-05-19 MED ORDER — ZOLPIDEM TARTRATE 5 MG PO TABS: 5.0000 mg | ORAL_TABLET | Freq: Every evening | ORAL | Status: DC | PRN

## 2019-05-19 MED ORDER — LACTATED RINGERS IV SOLN: 500.0000 mL | INTRAVENOUS | Status: DC | PRN

## 2019-05-19 MED ORDER — OXYTOCIN 40 UNITS IN NORMAL SALINE INFUSION - SIMPLE MED
1.0000 m[IU]/min | INTRAVENOUS | Status: DC
  Filled 2019-05-19: qty 1000

## 2019-05-19 MED ORDER — OXYTOCIN 40 UNITS IN NORMAL SALINE INFUSION - SIMPLE MED
1.0000 m[IU]/min | INTRAVENOUS | Status: DC
  Administered 2019-05-19: 2 m[IU]/min via INTRAVENOUS

## 2019-05-19 MED ORDER — ONDANSETRON HCL 4 MG/2ML IJ SOLN
4.0000 mg | Freq: Four times a day (QID) | INTRAMUSCULAR | Status: DC | PRN
  Administered 2019-05-19: 4 mg via INTRAVENOUS
  Filled 2019-05-19: qty 2

## 2019-05-19 MED ORDER — HYDROXYZINE HCL 50 MG PO TABS: 50.0000 mg | ORAL_TABLET | Freq: Four times a day (QID) | ORAL | Status: DC | PRN

## 2019-05-19 MED ORDER — MISOPROSTOL 50MCG HALF TABLET
50.0000 ug | ORAL_TABLET | ORAL | Status: DC | PRN
  Administered 2019-05-19: 50 ug via ORAL
  Filled 2019-05-19: qty 1

## 2019-05-19 MED ORDER — TERBUTALINE SULFATE 1 MG/ML IJ SOLN: 0.2500 mg | Freq: Once | INTRAMUSCULAR | Status: DC | PRN

## 2019-05-19 MED ORDER — FENTANYL-BUPIVACAINE-NACL 0.5-0.125-0.9 MG/250ML-% EP SOLN
12.0000 mL/h | EPIDURAL | Status: DC | PRN
  Filled 2019-05-19: qty 250

## 2019-05-19 MED ORDER — FLEET ENEMA 7-19 GM/118ML RE ENEM: 1.0000 | ENEMA | Freq: Every day | RECTAL | Status: DC | PRN

## 2019-05-19 MED ORDER — SOD CITRATE-CITRIC ACID 500-334 MG/5ML PO SOLN: 30.0000 mL | ORAL | Status: DC | PRN

## 2019-05-19 MED ORDER — PHENYLEPHRINE 40 MCG/ML (10ML) SYRINGE FOR IV PUSH (FOR BLOOD PRESSURE SUPPORT)
80.0000 ug | PREFILLED_SYRINGE | INTRAVENOUS | Status: DC | PRN
  Filled 2019-05-19: qty 10

## 2019-05-19 MED ORDER — DIPHENHYDRAMINE HCL 50 MG/ML IJ SOLN: 12.5000 mg | INTRAMUSCULAR | Status: DC | PRN

## 2019-05-19 MED ORDER — OXYCODONE-ACETAMINOPHEN 5-325 MG PO TABS: 1.0000 | ORAL_TABLET | ORAL | Status: DC | PRN

## 2019-05-19 MED ORDER — PHENYLEPHRINE 40 MCG/ML (10ML) SYRINGE FOR IV PUSH (FOR BLOOD PRESSURE SUPPORT): 80.0000 ug | PREFILLED_SYRINGE | INTRAVENOUS | Status: DC | PRN

## 2019-05-19 MED ORDER — EPHEDRINE 5 MG/ML INJ: 10.0000 mg | INTRAVENOUS | Status: DC | PRN

## 2019-05-19 MED ORDER — LEVONORGESTREL 19.5 MCG/DAY IU IUD
INTRAUTERINE_SYSTEM | Freq: Once | INTRAUTERINE | Status: AC
  Administered 2019-05-19: 1 via INTRAUTERINE
  Filled 2019-05-19: qty 1

## 2019-05-19 MED ORDER — SODIUM CHLORIDE (PF) 0.9 % IJ SOLN
INTRAMUSCULAR | Status: DC | PRN
  Administered 2019-05-19: 12 mL/h via EPIDURAL

## 2019-05-19 MED ORDER — OXYTOCIN 40 UNITS IN NORMAL SALINE INFUSION - SIMPLE MED: 2.5000 [IU]/h | INTRAVENOUS | Status: DC

## 2019-05-19 MED ORDER — MISOPROSTOL 25 MCG QUARTER TABLET: 25.0000 ug | ORAL_TABLET | ORAL | Status: DC | PRN

## 2019-05-19 MED ORDER — OXYCODONE-ACETAMINOPHEN 5-325 MG PO TABS: 2.0000 | ORAL_TABLET | ORAL | Status: DC | PRN

## 2019-05-19 MED ORDER — LIDOCAINE HCL (PF) 1 % IJ SOLN
30.0000 mL | INTRAMUSCULAR | Status: AC | PRN
  Administered 2019-05-19: 30 mL via SUBCUTANEOUS
  Filled 2019-05-19: qty 30

## 2019-05-19 MED ORDER — FENTANYL CITRATE (PF) 100 MCG/2ML IJ SOLN
50.0000 ug | INTRAMUSCULAR | Status: DC | PRN
  Administered 2019-05-19: 14:00:00 100 ug via INTRAVENOUS
  Filled 2019-05-19: qty 2

## 2019-05-19 MED ORDER — LIDOCAINE HCL (PF) 1 % IJ SOLN
INTRAMUSCULAR | Status: DC | PRN
  Administered 2019-05-19: 11 mL via EPIDURAL

## 2019-05-19 MED ORDER — LACTATED RINGERS IV SOLN: INTRAVENOUS | Status: DC

## 2019-05-19 MED ORDER — ACETAMINOPHEN 325 MG PO TABS: 650.0000 mg | ORAL_TABLET | ORAL | Status: DC | PRN

## 2019-05-19 MED ORDER — OXYTOCIN BOLUS FROM INFUSION
500.0000 mL | Freq: Once | INTRAVENOUS | Status: AC
  Administered 2019-05-19: 500 mL via INTRAVENOUS

## 2019-05-19 NOTE — Anesthesia Preprocedure Evaluation (Signed)
Anesthesia Evaluation  Patient identified by MRN, date of birth, ID band Patient awake    Reviewed: Allergy & Precautions, NPO status , Patient's Chart, lab work & pertinent test results  Airway Mallampati: II  TM Distance: >3 FB Neck ROM: Full    Dental no notable dental hx.    Pulmonary neg pulmonary ROS,    Pulmonary exam normal breath sounds clear to auscultation       Cardiovascular hypertension, negative cardio ROS Normal cardiovascular exam Rhythm:Regular Rate:Normal     Neuro/Psych negative neurological ROS  negative psych ROS   GI/Hepatic negative GI ROS, Neg liver ROS,   Endo/Other  diabetes  Renal/GU negative Renal ROS  negative genitourinary   Musculoskeletal  (+) Arthritis , Osteoarthritis,    Abdominal (+) + obese,   Peds negative pediatric ROS (+)  Hematology negative hematology ROS (+)   Anesthesia Other Findings   Reproductive/Obstetrics (+) Pregnancy                             Anesthesia Physical Anesthesia Plan  ASA: III  Anesthesia Plan: Epidural   Post-op Pain Management:    Induction:   PONV Risk Score and Plan:   Airway Management Planned:   Additional Equipment:   Intra-op Plan:   Post-operative Plan: Extubation in OR  Informed Consent: I have reviewed the patients History and Physical, chart, labs and discussed the procedure including the risks, benefits and alternatives for the proposed anesthesia with the patient or authorized representative who has indicated his/her understanding and acceptance.     Dental advisory given  Plan Discussed with: CRNA  Anesthesia Plan Comments:         Anesthesia Quick Evaluation

## 2019-05-19 NOTE — Progress Notes (Signed)
Delivery Note Bonnie Booth is a 19 y.o. G3P0020 at [redacted]w[redacted]d admitted for induction of labor for pre-eclampsia without severe features.  Labor course: Pt was cytotec x1 and foley bulb induction. Foley bulb out and started on Pitocin. Pt progressed well. Pt pushed for a little over 2 hours. ROM: 2h 77m with clear fluid  At 2210 a viable boy was delivered via spontaneous vaginal delivery (Presentation: vertex; LOA). Infant placed directly on mom's abdomen for bonding/skin-to-skin. Delayed cord clamping x , then cord clamped x 2, and cut by father of the baby. APGAR:8,9; weight: pending at time of note. 40 units of pitocin diluted in 1000cc LR was infused rapidly IV per protocol. The placenta separated spontaneously and delivered via CCT and maternal pushing effort. It was inspected and appears to be intact with a 3 VC. Placenta/Cord with the following complications: none. Cord pH: n/a  Intrapartum complications:  Pre-eclampsia Anesthesia:  local and epidural Episiotomy: none Lacerations:  2nd degree Suture Repair: 3.0 vicryl Est. Blood Loss (mL): 300 Sponge and instrument count were correct x2.  Mom to postpartum. Baby to Couplet care / Skin to Skin. Placenta to L&D. Plans to breast and bottle feed Contraception: post placental IUD Circ: outpatient  Post-Placental IUD Insertion Procedure Note  Patient identified, informed consent signed prior to delivery, signed copy in chart, time out was performed.    Vaginal, labial and perineal areas thoroughly inspected for lacerations. 2nd degree laceration identified -hemostatic, /not repaired prior to insertion of IUD. Liletta  - IUD grasped between sterile gloved fingers. Sterile lubrication applied to sterile gloved hand for ease of insertion. Fundus identified through abdominal wall using non-insertion hand. IUD inserted to fundus with bimanual technique. IUD carefully released at the fundus and insertion hand gently removed from vagina.   -  IUD inserted with inserter per manufacturer's instructions.    Strings trimmed to the level of the introitus. Patient tolerated procedure well.  Patient given post procedure instructions and IUD care card with expiration date.  Patient is asked to keep IUD strings tucked in her vagina until her postpartum follow up visit in 4-6 weeks. Patient advised to abstain from sexual intercourse and pulling on strings before her follow-up visit. Patient verbalized an understanding of the plan of care and agrees.   Brand Males, SNM 05/19/2019 11:03 PM

## 2019-05-19 NOTE — Anesthesia Procedure Notes (Signed)
Epidural Patient location during procedure: OB Start time: 05/19/2019 4:30 PM End time: 05/19/2019 4:40 PM  Staffing Anesthesiologist: Lowella Curb, MD Performed: anesthesiologist   Preanesthetic Checklist Completed: patient identified, IV checked, site marked, risks and benefits discussed, surgical consent, monitors and equipment checked, pre-op evaluation and timeout performed  Epidural Patient position: sitting Prep: ChloraPrep Patient monitoring: heart rate, cardiac monitor, continuous pulse ox and blood pressure Approach: midline Location: L2-L3 Injection technique: LOR saline  Needle:  Needle type: Tuohy  Needle gauge: 17 G Needle length: 9 cm Needle insertion depth: 8 cm Catheter type: closed end flexible Catheter size: 20 Guage Catheter at skin depth: 13 cm Test dose: negative  Assessment Events: blood not aspirated, injection not painful, no injection resistance, no paresthesia and negative IV test  Additional Notes Reason for block:procedure for pain

## 2019-05-19 NOTE — Patient Instructions (Signed)

## 2019-05-19 NOTE — Progress Notes (Signed)
Received notification from Dr. Debroah Loop that he wants pt to direct admit to L&D due to elevated BP.  Called Belenda Cruise, RN L&D charge was advised to have pt to go to security desk in [facility] and they will direct her to get COVID testing and then brought up to L&D.  I informed pt that she will not be able to have a visit with her until she gets to L&D.  Pt verbalized understanding.   Addison Naegeli, RN 05/19/19

## 2019-05-19 NOTE — Progress Notes (Signed)
LABOR PROGRESS NOTE  Bonnie Booth is a 19 y.o. G3P0020 at [redacted]w[redacted]d  admitted for IOL for PreE  Subjective: Patient doing well feeling vaginal pressure and more regular contractions. No complaints at this time.  Objective: BP 137/75    Pulse 87    Temp 98.7 F (37.1 C) (Axillary)    Resp 17    Ht 5\' 7"  (1.702 m)    Wt 108.9 kg    LMP 08/20/2018    SpO2 100%    BMI 37.59 kg/m   Dilation: 9 Effacement (%): 100 Cervical Position: Anterior Station: Plus 1 Presentation: Vertex Exam by:: Dr. [email or web address] Fetal monitoring: Baseline: 140 bpm, Variability: Good {> 6 bpm), Accelerations: Reactive and Decelerations: Absent Uterine activity: Frequency: Every 2-3 minutes, Intensity: moderate  Labs: Lab Results  Component Value Date   WBC 6.5 05/19/2019   HGB 9.9 (L) 05/19/2019   HCT 33.7 (L) 05/19/2019   MCV 79.3 (L) 05/19/2019   PLT 295 05/19/2019    Patient Active Problem List   Diagnosis Date Noted   Labor and delivery, indication for care 05/19/2019   Preeclampsia 05/19/2019   Gestational diabetes 03/16/2019   Alpha thalassemia silent carrier 12/02/2018   Supervision of other normal pregnancy, antepartum 11/04/2018    Assessment / Plan: Induction of labor due to preeclampsia,  progressing well  #Labor: Progressing on Pitocin. AROM @ 1926. S/p Cytotec x1. S/p FB. Pitocin @ 2 mu/min #Fetal Wellbeing:  Category I #Pain Control: Epidural and IV pain meds #ID: GBS neg, COVID pending #Anticipated MOD: NSVD  #PreE: BP readings reassuring, SBPmax remains 151. Continue to monitor vitals and clinical status. #A1GDM: CBGs all within normal range. For now will continue w/ q4h. If elevated CBG may increase to q2h.   01/05/2019, DO, PGY-1 Family Medicine Resident, [facility] Faculty Teaching Service  05/19/2019, 7:32 PM

## 2019-05-19 NOTE — Progress Notes (Signed)
   PRENATAL VISIT NOTE  Subjective:  Bonnie Booth is a 19 y.o. G3P0020 at [redacted]w[redacted]d being seen today for ongoing prenatal care.  She is currently monitored for the following issues for this high-risk pregnancy and has Supervision of other normal pregnancy, antepartum; Alpha thalassemia silent carrier; and Gestational diabetes on their problem list.  Patient reports headache.  Contractions: Irritability. Vag. Bleeding: None.  Movement: Present. Denies leaking of fluid.   The following portions of the patient's history were reviewed and updated as appropriate: allergies, current medications, past family history, past medical history, past social history, past surgical history and problem list.   Objective:   Vitals:   05/19/19 0844 05/19/19 0900  BP: 140/86 138/90  Pulse: 89   Weight: 224 lb 8 oz (101.8 kg)     Fetal Status: Fetal Heart Rate (bpm): 150   Movement: Present     General:  Alert, oriented and cooperative. Patient is in no acute distress.  Skin: Skin is warm and dry. No rash noted.   Cardiovascular: Normal heart rate noted  Respiratory: Normal respiratory effort, no problems with respiration noted  Abdomen: Soft, gravid, appropriate for gestational age.  Pain/Pressure: Present     Pelvic: Cervical exam deferred        Extremities: Normal range of motion.  Edema: Trace  Mental Status: Normal mood and affect. Normal behavior. Normal judgment and thought content.   Assessment and Plan:  Pregnancy: G3P0020 at [redacted]w[redacted]d 1. Gestational diabetes mellitus (GDM), antepartum, gestational diabetes method of control unspecified Not testing , urged compliance  2. Supervision of other normal pregnancy, antepartum GHTN vs. Preeclampsia, Dr Macon Large aware for admission  Term labor symptoms and general obstetric precautions including but not limited to vaginal bleeding, contractions, leaking of fluid and fetal movement were reviewed in detail with the patient. Please refer to After Visit  Summary for other counseling recommendations.   Return if symptoms worsen or fail to improve, for postpartum.  Future Appointments  Date Time Provider Department Center  05/19/2019 10:00 AM [facility]-SCREENING [facility] None  05/21/2019  7:00 AM [facility] SCHED ROOM [facility] None    Scheryl Darter, MD

## 2019-05-19 NOTE — H&P (Addendum)
LABOR AND DELIVERY ADMISSION HISTORY AND PHYSICAL NOTE  Bonnie Booth is a 19 y.o. female G42P0020 with IUP at 70w5dby first trimester U/S presenting for IOL for gHTN vs preE. She originally had plans for IOL '@39w'$  gestation for gHTN, however she had elevated pressures today in clinic and it was decided by her provider to admit her for IOL today.  She endorses headache with associated blurred vision x1-2 weeks. States it occurs intermittently and associated with elevated BP. Also admits to new onset swelling in the Right hand and feet b/l Denies RUQ pain. She reports positive fetal movement. She denies leakage of fluid, vaginal bleeding. Has had mild-mod contractions occurring every 5-134m and lasting for about 1 minute.   She plans on breast & bottle feeding. Her contraception plan is: undetermined, considering IUD.  [facility] History/Com[facility] , CWD, normal anatomy, cephalic presentation, posterior placenta, 69%ile, EFW 2lb 1oz  Pregnancy complications:  - GDMA1 - gHTN - Alpha thalassemia silent carrier - GERD (pepcid) - Anemia of pregnancy (oral iron)  Past Medical History: Past Medical History:  Diagnosis Date  . Anemia   . Hypertension   . [facility]    Past Surgical History: Past Surgical History:  Procedure Laterality Date  . NO PAST SURGERIES      Obstetrical History: OB History    Gravida  3   Para  0   Term      Preterm      AB  2   Living        SAB  2   TAB      Ectopic      Multiple      Live Births              Social History: Social History   Socioeconomic History  . Marital status: Single    Spouse name: Not on file  . Number of children: Not on file  . Years of education: Not on file  . Highest education level: Not on file  Occupational History  . Not on file  Tobacco Use  . Smoking status: Never Smoker  . Smokeless tobacco: Never Used  Substance and Sexual Activity  .  Alcohol use: No  . Drug use: Yes    Types: Marijuana    Comment: pt states years ago  . Sexual activity: Yes    Birth control/protection: None  Other Topics Concern  . Not on file  Social History Narrative  . Not on file   Social Determinants of Health   Financial Resource Strain:   . Difficulty of Paying Living Expenses: Not on file  Food Insecurity:   . Worried About RuCharity fundraisern the Last Year: Not on file  . Ran Out of Food in the Last Year: Not on file  Transportation Needs:   . Lack of Transportation (Medical): Not on file  . Lack of Transportation (Non-Medical): Not on file  Physical Activity:   . Days of Exercise per Week: Not on file  . Minutes of Exercise per Session: Not on file  Stress:   . Feeling of Stress : Not on file  Social Connections:   . Frequency of Communication with Friends and Family: Not on file  . Frequency of Social Gatherings with Friends and Family: Not on file  . Attends Religious Services: Not on file  . Active Member of Clubs or Organizations: Not on file  . Attends Club or  Organization Meetings: Not on file  . Marital Status: Not on file    Family History: History reviewed. No pertinent family history.  Allergies: Allergies  Allergen Reactions  . Apple Anaphylaxis and Swelling    Patient's throat swells  . Bee Venom Anaphylaxis and Swelling    Patient's throat swells  . Cinnamon Anaphylaxis and Swelling    Patient's throat swells  . Mango Flavor [Flavoring Agent] Anaphylaxis and Swelling    Patient's throat swells  . Peach [Prunus Persica] Anaphylaxis and Swelling  . Pineapple Anaphylaxis and Swelling    Patient's throat swells  . Tomato Anaphylaxis    Medications Prior to Admission  Medication Sig Dispense Refill Last Dose  . Prenatal MV & Min w/FA-DHA (PRENATAL ADULT GUMMY/DHA/FA) 0.4-25 MG CHEW Chew 1 Dose by mouth daily. 30 tablet 11 Past Month at Unknown time  . Accu-Chek FastClix Lancets MISC 1 Device by  Percutaneous route 4 (four) times daily. (Patient not taking: Reported on 04/05/2019) 100 each 12   . Blood Pressure Monitoring (BLOOD PRESSURE KIT) DEVI 1 Device by Does not apply route daily. ICD 10: Z34.00 (Patient not taking: Reported on 04/05/2019) 1 Device 0   . glucose blood (ACCU-CHEK GUIDE) test strip Check blood sugar four times per day (Patient not taking: Reported on 04/05/2019) 100 each 12      Review of Systems  All systems reviewed and negative except as stated in HPI  Physical Exam Blood pressure (!) 142/82, pulse 90, temperature 98.3 F (36.8 C), temperature source Oral, resp. rate 18, height '5\' 7"'$  (1.702 m), weight 108.9 kg, last menstrual period 08/20/2018. General appearance: alert, oriented, NAD Lungs: normal respiratory effort Heart: regular rate Abdomen: soft, non-tender; gravid Extremities: No calf swelling or tenderness Presentation: cephalic by BSUS  Fetal monitoring: Baseline: 150 bpm, Variability: Good {> 6 bpm), Accelerations: Reactive and Decelerations: Absent Uterine activity: Frequency: Every 5-6 minutes, Duration: 30-60 seconds and Intensity: mod  Dilation: 2.5 Effacement (%): 50 Station: -2 Exam by:: Loraine Grip, RN   Prenatal labs: ABO, Rh: O/Positive/-- (07/09 1526) Antibody: Negative (07/09 1526) Rubella: 2.62 (07/09 1526) RPR: Non Reactive (11/17 0922)  HBsAg: Negative (07/09 1526)  HIV: Non Reactive (11/17 0922)  GC/Chlamydia: neg  GBS: Negative/-- (01/17 0000) neg 2-hr GTT: Abnormal Genetic screening:  WNL Anatomy US: WNL  Prenatal Transfer Tool  Maternal Diabetes: Yes:  Diabetes Type:  Diet controlled (non compliant) Genetic Screening: Normal Maternal Ultrasounds/Referrals: Normal Fetal Ultrasounds or other Referrals:  None Maternal Substance Abuse:  No Significant Maternal Medications:  Meds include: Other: oral iron, pepcid Significant Maternal Lab Results: Other: Rh positive, GBS neg  No results found for this or any  previous visit (from the past 24 hour(s)).  Patient Active Problem List   Diagnosis Date Noted  . Labor and delivery, indication for care 05/19/2019  . Gestational diabetes 03/16/2019  . Alpha thalassemia silent carrier 12/02/2018  . Supervision of other normal pregnancy, antepartum 11/04/2018    Assessment: Bonnie Booth is a 19 y.o. G3P0020 at 65w5dhere for IOL for gHTN vs PreE  #Labor: IOL for gHTN. Will begin w/ cytotec for cervical ripening. Monitor exam q4h. #Pain: IV pain meds PRN, epidural upon request #FWB: Cat I, stable #GBS/ID: GBS neg, CO19 pending #COVID: swab pending #MOF: both #MOC: undecided, interested in IUD after delivery #Circ: Yes, Outpatient #gHTN vs PreE: No severe features but does have associated sxs of HA and blurred vision in the setting of new worsening BP and new onset proteinuria.  CBC, CMP pending, urine protein/cr ratio pending. Continue to monitor BP  Marylouise Stacks 05/19/2019, 12:29 PM  I saw and evaluated the patient. I agree with the findings and the plan of care as documented in the resident's note. IOL for gHTN. Pre-E labs pending. Also GDMA1. CBG's q4h latent, q2h active labor. Foley bulb and Cytotec induction. Vertex by exam and BSUS.  Barrington Ellison, MD [facility] Family Medicine Fellow, [facility] for [employer], [place]

## 2019-05-19 NOTE — Progress Notes (Signed)
LABOR PROGRESS NOTE  Bonnie Booth is a 19 y.o. G3P0020 at [redacted]w[redacted]d  admitted for IOL for PreE  Subjective: Patient doing well since epidural placement, she is more comfortable and in better spirits. States she does not feel contractions as intensely. No complaints at this time.  Objective: BP 129/60   Pulse (!) 106   Temp 98.7 F (37.1 C) (Axillary)   Resp 17   Ht 5\' 7"  (1.702 m)   Wt 108.9 kg   LMP 08/20/2018   SpO2 100%   BMI 37.59 kg/m   Dilation: 5 Effacement (%): 70, 80 Cervical Position: Middle Station: -2 Presentation: Vertex Exam by:: [email or web address], RN Fetal monitoring: Baseline: 145 bpm, Variability: Good {> 6 bpm), Accelerations: Reactive and Decelerations: Absent Uterine activity: Frequency: Every 2-3 minutes, Intensity: moderate  Labs: Lab Results  Component Value Date   WBC 6.5 05/19/2019   HGB 9.9 (L) 05/19/2019   HCT 33.7 (L) 05/19/2019   MCV 79.3 (L) 05/19/2019   PLT 295 05/19/2019    Patient Active Problem List   Diagnosis Date Noted  . Labor and delivery, indication for care 05/19/2019  . Preeclampsia 05/19/2019  . Gestational diabetes 03/16/2019  . Alpha thalassemia silent carrier 12/02/2018  . Supervision of other normal pregnancy, antepartum 11/04/2018    Assessment / Plan: Induction of labor due to preeclampsia,  progressing well  #Labor: Progressing on Pitocin, will continue to increase then AROM Cytotec x1. S/p FB. Pitocin @ 2 mu/min #Fetal Wellbeing:  Category I #Pain Control: Epidural and IV pain meds #ID: GBS neg, COVID pending #Anticipated MOD: NSVD  #PreE: BP readings reassuring, SBPmax 151. Continue to monitor vitals and clinical status. #A1GDM: CBGs all within normal range. For now will continue w/ q4h. If elevated CBG may increase to q2h.   01/05/2019, DO, PGY-1 Family Medicine Resident, [facility] Faculty Teaching Service  05/19/2019, 5:12 PM

## 2019-05-20 ENCOUNTER — Encounter (HOSPITAL_COMMUNITY): Payer: Self-pay | Admitting: Obstetrics & Gynecology

## 2019-05-20 LAB — CBC WITH DIFFERENTIAL/PLATELET
Abs Immature Granulocytes: 0.06 10*3/uL (ref 0.00–0.07)
Basophils Absolute: 0 10*3/uL (ref 0.0–0.1)
Basophils Relative: 0 %
Eosinophils Absolute: 0 10*3/uL (ref 0.0–0.5)
Eosinophils Relative: 0 %
HCT: 29.6 % — ABNORMAL LOW (ref 36.0–46.0)
Hemoglobin: 8.8 g/dL — ABNORMAL LOW (ref 12.0–15.0)
Immature Granulocytes: 1 %
Lymphocytes Relative: 7 %
Lymphs Abs: 0.8 10*3/uL (ref 0.7–4.0)
MCH: 23.5 pg — ABNORMAL LOW (ref 26.0–34.0)
MCHC: 29.7 g/dL — ABNORMAL LOW (ref 30.0–36.0)
MCV: 79.1 fL — ABNORMAL LOW (ref 80.0–100.0)
Monocytes Absolute: 0.4 10*3/uL (ref 0.1–1.0)
Monocytes Relative: 4 %
Neutro Abs: 9.5 10*3/uL — ABNORMAL HIGH (ref 1.7–7.7)
Neutrophils Relative %: 88 %
Platelets: 254 10*3/uL (ref 150–400)
RBC: 3.74 MIL/uL — ABNORMAL LOW (ref 3.87–5.11)
RDW: 14.9 % (ref 11.5–15.5)
WBC: 10.7 10*3/uL — ABNORMAL HIGH (ref 4.0–10.5)
nRBC: 0 % (ref 0.0–0.2)

## 2019-05-20 LAB — RPR: RPR Ser Ql: NONREACTIVE

## 2019-05-20 MED ORDER — DIBUCAINE (PERIANAL) 1 % EX OINT: 1.0000 "application " | TOPICAL_OINTMENT | CUTANEOUS | Status: DC | PRN

## 2019-05-20 MED ORDER — IBUPROFEN 600 MG PO TABS
600.0000 mg | ORAL_TABLET | Freq: Four times a day (QID) | ORAL | Status: DC
  Administered 2019-05-20 (×3): 600 mg via ORAL
  Filled 2019-05-20 (×3): qty 1

## 2019-05-20 MED ORDER — COCONUT OIL OIL: 1.0000 "application " | TOPICAL_OIL | Status: DC | PRN

## 2019-05-20 MED ORDER — ACETAMINOPHEN 325 MG PO TABS: 650.0000 mg | ORAL_TABLET | ORAL | Status: DC | PRN

## 2019-05-20 MED ORDER — ONDANSETRON HCL 4 MG PO TABS: 4.0000 mg | ORAL_TABLET | ORAL | Status: DC | PRN

## 2019-05-20 MED ORDER — PRENATAL MULTIVITAMIN CH
1.0000 | ORAL_TABLET | Freq: Every day | ORAL | Status: DC
  Administered 2019-05-20: 1 via ORAL
  Filled 2019-05-20: qty 1

## 2019-05-20 MED ORDER — OXYCODONE HCL 5 MG PO TABS: 5.0000 mg | ORAL_TABLET | ORAL | Status: DC | PRN

## 2019-05-20 MED ORDER — ZOLPIDEM TARTRATE 5 MG PO TABS: 5.0000 mg | ORAL_TABLET | Freq: Every evening | ORAL | Status: DC | PRN

## 2019-05-20 MED ORDER — WITCH HAZEL-GLYCERIN EX PADS: 1.0000 "application " | MEDICATED_PAD | CUTANEOUS | Status: DC | PRN

## 2019-05-20 MED ORDER — SIMETHICONE 80 MG PO CHEW: 80.0000 mg | CHEWABLE_TABLET | ORAL | Status: DC | PRN

## 2019-05-20 MED ORDER — DIPHENHYDRAMINE HCL 25 MG PO CAPS: 25.0000 mg | ORAL_CAPSULE | Freq: Four times a day (QID) | ORAL | Status: DC | PRN

## 2019-05-20 MED ORDER — BENZOCAINE-MENTHOL 20-0.5 % EX AERO
1.0000 "application " | INHALATION_SPRAY | CUTANEOUS | Status: DC | PRN
  Filled 2019-05-20: qty 56

## 2019-05-20 MED ORDER — OXYCODONE HCL 5 MG PO TABS: 10.0000 mg | ORAL_TABLET | ORAL | Status: DC | PRN

## 2019-05-20 MED ORDER — ENALAPRIL MALEATE 5 MG PO TABS
5.0000 mg | ORAL_TABLET | Freq: Every day | ORAL | Status: DC
  Administered 2019-05-20 – 2019-05-21 (×2): 5 mg via ORAL
  Filled 2019-05-20 (×2): qty 1

## 2019-05-20 MED ORDER — SENNOSIDES-DOCUSATE SODIUM 8.6-50 MG PO TABS
2.0000 | ORAL_TABLET | ORAL | Status: DC
  Administered 2019-05-20: 2 via ORAL
  Filled 2019-05-20: qty 2

## 2019-05-20 MED ORDER — FERROUS SULFATE 325 (65 FE) MG PO TABS
325.0000 mg | ORAL_TABLET | ORAL | Status: DC
  Administered 2019-05-20: 325 mg via ORAL
  Filled 2019-05-20: qty 1

## 2019-05-20 MED ORDER — ONDANSETRON HCL 4 MG/2ML IJ SOLN: 4.0000 mg | INTRAMUSCULAR | Status: DC | PRN

## 2019-05-20 MED ORDER — ASCORBIC ACID 500 MG PO TABS
500.0000 mg | ORAL_TABLET | ORAL | Status: DC
  Administered 2019-05-20: 23:00:00 500 mg via ORAL
  Filled 2019-05-20: qty 1

## 2019-05-20 NOTE — Discharge Summary (Addendum)
  Postpartum Discharge Summary    Patient Name: Bonnie Booth DOB: 01/06/2001 MRN: [id number]  Date of admission: 05/19/2019 Delivering Provider: SIMPSON, DANIELLE L   Date of discharge: 05/21/2019  Admitting diagnosis: Labor and delivery, indication for care [O75.9] Intrauterine pregnancy: [redacted]w[redacted]d     Secondary diagnosis:  Active Problems:   Alpha thalassemia silent carrier   Gestational diabetes   Labor and delivery, indication for care   Preeclampsia   Perineal laceration, second degree   Normal spontaneous vaginal delivery  Additional problems: none     Discharge diagnosis: Term Pregnancy Delivered                                                                                                Post partum procedures: postpartum IUD  Augmentation: AROM, Pitocin, Cytotec and Foley Balloon  Complications: None  Hospital course:  Induction of Labor With Vaginal Delivery   18 y.o. yo G3P1021 at [redacted]w[redacted]d was admitted to the hospital 05/19/2019 for induction of labor.  Indication for induction: Preeclampsia.  Patient had an uncomplicated labor course as follows: Membrane Rupture Time/Date: 7:26 PM ,05/19/2019   Intrapartum Procedures: Episiotomy: None [1]                                         Lacerations:  2nd degree [3]  Patient had delivery of a Viable infant.  Information for the patient's newborn:  [place], Boy Janiqua [[id number]]  Delivery Method: Vaginal, Spontaneous(Filed from Delivery Summary)    05/19/2019  Details of delivery can be found in separate delivery note.  Patient had a routine postpartum course, with the exception of mildly elevated blood pressures 141/83, vasotec 5mg prescribed, patient will likely be able to wean off during postpartum course. Patient is discharged home 05/21/19. Delivery time: 10:10 PM    Magnesium Sulfate received: No BMZ received: No Rhophylac:No MMR:No Transfusion:No  Physical exam  Vitals:   05/20/19 1025 05/20/19 1346 05/20/19 2300  05/21/19 0541  BP: 134/67 (!) 141/83 135/70 (!) 141/79  Pulse: 77 100 99 (!) 105  Resp:   16 18  Temp: 98.6 F (37 C) 98.3 F (36.8 C) 98 F (36.7 C) 98 F (36.7 C)  TempSrc: Oral Axillary Oral Oral  SpO2:   100%   Weight:      Height:       General: alert, cooperative and no distress Lochia: appropriate Uterine Fundus: firm Incision: N/A DVT Evaluation: No evidence of DVT seen on physical exam. No cords or calf tenderness. No significant calf/ankle edema. Labs: Lab Results  Component Value Date   WBC 10.7 (H) 05/20/2019   HGB 8.8 (L) 05/20/2019   HCT 29.6 (L) 05/20/2019   MCV 79.1 (L) 05/20/2019   PLT 254 05/20/2019   CMP Latest Ref Rng & Units 05/19/2019  Glucose 70 - 99 mg/dL 84  BUN 6 - 20 mg/dL 11  Creatinine 0.44 - 1.00 mg/dL 0.85  Sodium 135 - 145 mmol/L 133(L)  Potassium 3.5 - 5.1 mmol/L 4.6    Chloride 98 - 111 mmol/L 101  CO2 22 - 32 mmol/L 21(L)  Calcium 8.9 - 10.3 mg/dL 9.0  Total Protein 6.5 - 8.1 g/dL 6.4(L)  Total Bilirubin 0.3 - 1.2 mg/dL 0.3  Alkaline Phos 38 - 126 U/L 134(H)  AST 15 - 41 U/L 15  ALT 0 - 44 U/L 10    Discharge instruction: per After Visit Summary and "Baby and Me Booklet".  After visit meds:  Allergies as of 05/21/2019       Reactions   Apple Anaphylaxis, Swelling   Patient's throat swells   Bee Venom Anaphylaxis, Swelling   Patient's throat swells   Cinnamon Anaphylaxis, Swelling   Patient's throat swells   Mango Flavor [flavoring Agent] Anaphylaxis, Swelling   Patient's throat swells   Peach [prunus Persica] Anaphylaxis, Swelling   Pineapple Anaphylaxis, Swelling   Patient's throat swells   Tomato Anaphylaxis        Medication List     STOP taking these medications    Accu-Chek FastClix Lancets Misc   Accu-Chek Guide test strip Generic drug: glucose blood   Blood Pressure Kit Devi       TAKE these medications    acetaminophen 325 MG tablet Commonly known as: Tylenol Take 2 tablets (650 mg total) by  mouth every 4 (four) hours as needed (for pain scale < 4).   ascorbic acid 500 MG tablet Commonly known as: VITAMIN C Take 1 tablet (500 mg total) by mouth every other day. Start taking on: May 22, 2019   enalapril 5 MG tablet Commonly known as: VASOTEC Take 1 tablet (5 mg total) by mouth daily.   ferrous sulfate 325 (65 FE) MG tablet Take 1 tablet (325 mg total) by mouth every other day. Start taking on: May 22, 2019   ibuprofen 600 MG tablet Commonly known as: ADVIL Take 1 tablet (600 mg total) by mouth every 6 (six) hours.   Prenatal Adult Gummy/DHA/FA 0.4-25 MG Chew Chew 1 Dose by mouth daily.        Diet: low salt diet  Activity: Advance as tolerated. Pelvic rest for 6 weeks.   Outpatient follow up:6 weeks Follow up Appt: Future Appointments  Date Time Provider Department Center  06/27/2019 10:55 AM Wenzel, Julie N, PA-C [facility]   Follow up Visit:     Postpartum message sent to [facility] on 05/20/19:  Please schedule this patient for Postpartum visit in: 6 weeks with the following provider: Any provider In-Person For C/S patients schedule nurse incision check in weeks 2 weeks: no High risk pregnancy complicated by: GDM, preeclampsia without severe features Delivery mode:  SVD Anticipated Birth Control:  PP IUD Placed PP Procedures needed: BP check, then string check at 6 week visit  Schedule Integrated BH visit: no     Newborn Data: Live born female  Birth Weight: 9 lb 0.6 oz (4099 g) APGAR: 8, 9  Newborn Delivery   Birth date/time: 05/19/2019 22:10:00 Delivery type: Vaginal, Spontaneous      Baby Feeding: Bottle and Breast Disposition:home with mother   05/21/2019 Caitlin Mahoney, MD  GME ATTESTATION:  I saw and evaluated the patient. I agree with the findings and the plan of care as documented in the resident's note.  Hailey L Sparacino, DO OB Fellow, Faculty [facility] [place] 05/21/2019 8:39  AM  

## 2019-05-20 NOTE — Progress Notes (Signed)
Post Partum Day 1 Subjective: no complaints, up ad lib, voiding and tolerating PO  Objective: Blood pressure (!) 142/75, pulse 83, temperature 98.3 F (36.8 C), temperature source Oral, resp. rate 16, height 5\' 7"  (1.702 m), weight 108.9 kg, last menstrual period 08/20/2018, SpO2 100 %, unknown if currently breastfeeding.  Physical Exam:  General: alert, cooperative and no distress Lochia: appropriate Uterine Fundus: firm Incision: n/a DVT Evaluation: No evidence of DVT seen on physical exam.  Recent Labs    05/19/19 1245 05/20/19 0025  HGB 9.9* 8.8*  HCT 33.7* 29.6*    Assessment/Plan: Plan for discharge tomorrow and Contraception PP IUD placed  Pt bottle feeding, reports baby did not latch well.  Education provided, pt encouraged to try latch baby before feeding formula, place baby skin to skin, follow up with lactation today.   LOS: 1 day   05/22/19 05/20/2019, 6:59 AM

## 2019-05-20 NOTE — Anesthesia Postprocedure Evaluation (Signed)
Anesthesia Post Note  Patient: Bonnie Booth  Procedure(s) Performed: AN AD HOC LABOR EPIDURAL     Patient location during evaluation: Mother Baby Anesthesia Type: Epidural Level of consciousness: awake and alert Pain management: pain level controlled Vital Signs Assessment: post-procedure vital signs reviewed and stable Respiratory status: spontaneous breathing, nonlabored ventilation and respiratory function stable Cardiovascular status: stable Postop Assessment: no headache, no backache and epidural receding Anesthetic complications: no Comments: Per telephone conversation    Last Vitals:  Vitals:   05/20/19 0200 05/20/19 0615  BP: 134/90 (!) 142/75  Pulse: 95 83  Resp: 16 16  Temp: 36.8 C 36.8 C  SpO2: 100% 100%    Last Pain:  Vitals:   05/20/19 0705  TempSrc:   PainSc: 0-No pain   Pain Goal:                   Trellis Paganini

## 2019-05-20 NOTE — Lactation Note (Signed)
This note was copied from a baby's chart. Lactation Consultation Note  Patient Name: Boy Leonilda Cozby [id number] Date: 05/20/2019 Reason for consult: Initial assessment;Early term 57-38.6wks   Mom STS with first baby.  States she doesn't think her milk is good and baby doesn't seem to want to bf.  She states she needs to feed the baby every 2 hours.   She gave 20 ml formula approx. 2 hours ago.  Moms goal is to both breast and bottle feed.    LC discussed with mom and dad benefits of breastmilk and also discussed feeding cues.    Offered to teach mom and dad how to hand exp.  Mom returned demonstration and LC assisted to collect 3 ml of EBM easily into spoon for later feed.    LC encouraged family to do STS, attempt to bf when infant wakes, hand exp. Prior to feed and after.  Colostrum container was provided for future collection.  [facility] brochure provided and OP services and support groups shared.  LC encouraged mom and dad to bf when infant shows cues and call for assistance when needed.    Maternal Data Has patient been taught Hand Expression?: Yes Does the patient have breastfeeding experience prior to this delivery?: No  Feeding    LATCH Score                   Interventions Interventions: Breast feeding basics reviewed;Skin to skin;Expressed milk  Lactation Tools Discussed/Used     Consult Status Consult Status: Follow-up Date: 05/21/19 Follow-up type: In-patient    Maryruth Hancock [facility] 05/20/2019, 11:59 AM

## 2019-05-20 NOTE — Progress Notes (Signed)
Brought patient information on Advance Directives that she requested on admission.  She did not wish to fill them out at this time, but is aware of our ongoing availability to assist her if she wishes to do so while she is in the hospital.  [name], Bcc Pager, [phone] 4:14 PM

## 2019-05-21 ENCOUNTER — Inpatient Hospital Stay (HOSPITAL_COMMUNITY): Payer: Medicaid Other

## 2019-05-21 MED ORDER — AMLODIPINE BESYLATE 5 MG PO TABS: 2.5000 mg | ORAL_TABLET | Freq: Every day | ORAL | Status: DC

## 2019-05-21 MED ORDER — ASCORBIC ACID 500 MG PO TABS: 500.0000 mg | ORAL_TABLET | ORAL | 0 refills | Status: DC

## 2019-05-21 MED ORDER — ACETAMINOPHEN 325 MG PO TABS: 650.0000 mg | ORAL_TABLET | ORAL | 0 refills | Status: DC | PRN

## 2019-05-21 MED ORDER — ENALAPRIL MALEATE 5 MG PO TABS: 5.0000 mg | ORAL_TABLET | Freq: Every day | ORAL | 0 refills | Status: DC

## 2019-05-21 MED ORDER — IBUPROFEN 600 MG PO TABS: 600.0000 mg | ORAL_TABLET | Freq: Four times a day (QID) | ORAL | 0 refills | Status: DC

## 2019-05-21 MED ORDER — FERROUS SULFATE 325 (65 FE) MG PO TABS: 325.0000 mg | ORAL_TABLET | ORAL | 0 refills | Status: DC

## 2019-05-21 NOTE — Lactation Note (Signed)
This note was copied from a baby's chart. Lactation Consultation Note  Patient Name: Boy Blinda Turek [id number] Date: 05/21/2019 Reason for consult: Follow-up assessment;Early term 37-38.6wks  LC in to visit with P1 Mom of ET infant at 14 hrs old.  Baby at 5% weight loss.  Mom is breastfeeding and formula feeding baby.   Encouraged keeping baby STS as much as possible, and offer breast often with cues.   Engorgement prevention and treatment reviewed.  Mom aware of supply meets demand, encouraging more breastfeeding to support a full milk supply. Mom aware of OP lactation support available to her.  Encouraged Mom to call prn. Harmony hand pump given with instructions on use.   Consult Status Consult Status: Complete Date: 05/21/19 Follow-up type: Call as needed    Judee Clara 05/21/2019, 10:39 AM

## 2019-05-26 ENCOUNTER — Encounter: Payer: Self-pay | Admitting: Advanced Practice Midwife

## 2019-05-26 DIAGNOSIS — Z975 Presence of (intrauterine) contraceptive device: Secondary | ICD-10-CM | POA: Insufficient documentation

## 2019-05-26 HISTORY — DX: Presence of (intrauterine) contraceptive device: Z97.5

## 2019-06-13 ENCOUNTER — Encounter: Payer: Self-pay | Admitting: General Practice

## 2019-06-27 ENCOUNTER — Ambulatory Visit: Payer: Medicaid Other | Admitting: Advanced Practice Midwife

## 2019-07-03 ENCOUNTER — Emergency Department (HOSPITAL_COMMUNITY)
Admission: EM | Admit: 2019-07-03 | Discharge: 2019-07-03 | Disposition: A | Discharge status: Home / Self Care | Payer: Medicaid Other | Attending: Emergency Medicine | Admitting: Emergency Medicine

## 2019-07-03 ENCOUNTER — Encounter (HOSPITAL_COMMUNITY): Payer: Self-pay | Admitting: Emergency Medicine

## 2019-07-03 ENCOUNTER — Other Ambulatory Visit: Payer: Self-pay

## 2019-07-03 DIAGNOSIS — Z975 Presence of (intrauterine) contraceptive device: Secondary | ICD-10-CM | POA: Insufficient documentation

## 2019-07-03 DIAGNOSIS — I10 Essential (primary) hypertension: Secondary | ICD-10-CM | POA: Diagnosis not present

## 2019-07-03 DIAGNOSIS — Z79899 Other long term (current) drug therapy: Secondary | ICD-10-CM | POA: Insufficient documentation

## 2019-07-03 DIAGNOSIS — K649 Unspecified hemorrhoids: Secondary | ICD-10-CM | POA: Insufficient documentation

## 2019-07-03 DIAGNOSIS — K625 Hemorrhage of anus and rectum: Secondary | ICD-10-CM | POA: Diagnosis present

## 2019-07-03 LAB — I-STAT CHEM 8, ED
BUN: 17 mg/dL (ref 6–20)
Calcium, Ion: 1.17 mmol/L (ref 1.15–1.40)
Chloride: 106 mmol/L (ref 98–111)
Creatinine, Ser: 0.8 mg/dL (ref 0.44–1.00)
Glucose, Bld: 90 mg/dL (ref 70–99)
HCT: 34 % — ABNORMAL LOW (ref 36.0–46.0)
Hemoglobin: 11.6 g/dL — ABNORMAL LOW (ref 12.0–15.0)
Potassium: 4.1 mmol/L (ref 3.5–5.1)
Sodium: 141 mmol/L (ref 135–145)
TCO2: 28 mmol/L (ref 22–32)

## 2019-07-03 LAB — I-STAT BETA HCG BLOOD, ED (MC, WL, AP ONLY): I-stat hCG, quantitative: <5 m[IU]/mL (ref <5)

## 2019-07-03 MED ORDER — HYDROCORTISONE ACETATE 25 MG RE SUPP: 25.0000 mg | Freq: Two times a day (BID) | RECTAL | 0 refills | Status: DC

## 2019-07-03 MED ORDER — POLYETHYLENE GLYCOL 3350 17 G PO PACK: 17.0000 g | PACK | Freq: Every day | ORAL | 0 refills | Status: DC

## 2019-07-03 NOTE — ED Triage Notes (Signed)
Pt reports recent vaginal delivery on 1/21, states she was using stool softeners initially and then she ran out and became constipated x2 weeks, she states she took a laxative and then was able to have a bowel movement but states it was very hard and she noticed BRB in the toilet today with all 3 BMs she had.

## 2019-07-03 NOTE — ED Provider Notes (Signed)
[facility] EMERGENCY DEPARTMENT Provider Note   CSN: [id number] Arrival date & time: 07/03/19  1127     History Chief Complaint  Patient presents with  . Rectal Bleeding    possible hemorrhoids     Bonnie Booth is a 19 y.o. female.  The history is provided by the patient.  Rectal Bleeding Quality:  Bright red Amount:  Scant Duration:  3 days Timing:  Intermittent Chronicity:  New Context: hemorrhoids   Similar prior episodes: no   Relieved by:  Wipes Worsened by:  Defecation Associated symptoms: no abdominal pain, no dizziness, no fever, no light-headedness, no loss of consciousness, no recent illness and no vomiting        Past Medical History:  Diagnosis Date  . Anemia   . Hypertension   . Rheumatoid arthritis [facility])     Patient Active Problem List   Diagnosis Date Noted  . IUD (intrauterine device) in place 05/26/2019  . Perineal laceration, second degree 05/20/2019  . Normal spontaneous vaginal delivery 05/20/2019  . Labor and delivery, indication for care 05/19/2019  . Preeclampsia 05/19/2019  . Gestational diabetes 03/16/2019  . Alpha thalassemia silent carrier 12/02/2018  . Supervision of other normal pregnancy, antepartum 11/04/2018    Past Surgical History:  Procedure Laterality Date  . NO PAST SURGERIES       OB History    Gravida  3   Para  1   Term  1   Preterm      AB  2   Living  1     SAB  2   TAB      Ectopic      Multiple  0   Live Births  1           No family history on file.  Social History   Tobacco Use  . Smoking status: Never Smoker  . Smokeless tobacco: Never Used  Substance Use Topics  . Alcohol use: No  . Drug use: Yes    Types: Marijuana    Comment: pt states years ago    Home Medications Prior to Admission medications   Medication Sig Start Date End Date Taking? Authorizing Provider  acetaminophen (TYLENOL) 325 MG tablet Take 2 tablets (650 mg total) by mouth every 4  (four) hours as needed (for pain scale < 4). 05/21/19   Shirlean Mylar, MD  ascorbic acid (VITAMIN C) 500 MG tablet Take 1 tablet (500 mg total) by mouth every other day. 05/22/19   Shirlean Mylar, MD  enalapril (VASOTEC) 5 MG tablet Take 1 tablet (5 mg total) by mouth daily. 05/21/19   Shirlean Mylar, MD  ferrous sulfate 325 (65 FE) MG tablet Take 1 tablet (325 mg total) by mouth every other day. 05/22/19   Shirlean Mylar, MD  hydrocortisone (ANUSOL-HC) 25 MG suppository Place 1 suppository (25 mg total) rectally 2 (two) times daily. 07/03/19   Shalah Estelle, DO  ibuprofen (ADVIL) 600 MG tablet Take 1 tablet (600 mg total) by mouth every 6 (six) hours. 05/21/19   Shirlean Mylar, MD  polyethylene glycol (MIRALAX / GLYCOLAX) 17 g packet Take 17 g by mouth daily. 07/03/19   Virgina Norfolk, DO  Prenatal MV & Min w/FA-DHA (PRENATAL ADULT GUMMY/DHA/FA) 0.4-25 MG CHEW Chew 1 Dose by mouth daily. 11/04/18   Arvilla Market, DO    Allergies    Apple, Bee venom, Cinnamon, Mango flavor [flavoring agent], Peach [prunus persica], Pineapple, and Tomato  Review of Systems  Review of Systems  Constitutional: Negative for chills and fever.  HENT: Negative for ear pain and sore throat.   Eyes: Negative for pain and visual disturbance.  Respiratory: Negative for cough and shortness of breath.   Cardiovascular: Negative for chest pain and palpitations.  Gastrointestinal: Positive for blood in stool, constipation and hematochezia. Negative for abdominal pain and vomiting.  Genitourinary: Negative for dysuria and hematuria.  Musculoskeletal: Negative for arthralgias and back pain.  Skin: Negative for color change and rash.  Neurological: Negative for dizziness, seizures, loss of consciousness, syncope and light-headedness.  All other systems reviewed and are negative.   Physical Exam Updated Vital Signs BP 109/64 (BP Location: Right Arm)   Pulse 99   Temp 98.6 F (37 C) (Oral)   Resp 20    LMP 08/20/2018   SpO2 99%   Physical Exam Vitals and nursing note reviewed.  Constitutional:      General: She is not in acute distress.    Appearance: She is well-developed.  HENT:     Head: Normocephalic and atraumatic.     Mouth/Throat:     Mouth: Mucous membranes are dry.  Eyes:     Conjunctiva/sclera: Conjunctivae normal.  Cardiovascular:     Rate and Rhythm: Normal rate and regular rhythm.     Pulses: Normal pulses.     Heart sounds: Normal heart sounds. No murmur.  Pulmonary:     Effort: Pulmonary effort is normal. No respiratory distress.     Breath sounds: Normal breath sounds.  Abdominal:     Palpations: Abdomen is soft.     Tenderness: There is no abdominal tenderness.  Genitourinary:    Comments: Small external hemorrhoid, no fissure, no fluctuance or abscess Musculoskeletal:     Cervical back: Neck supple.  Skin:    General: Skin is warm and dry.     Capillary Refill: Capillary refill takes less than 2 seconds.  Neurological:     Mental Status: She is alert.     ED Results / Procedures / Treatments   Labs (all labs ordered are listed, but only abnormal results are displayed) Labs Reviewed  I-STAT CHEM 8, ED - Abnormal; Notable for the following components:      Result Value   Hemoglobin 11.6 (*)    HCT 34.0 (*)    All other components within normal limits  I-STAT BETA HCG BLOOD, ED ([facility], [facility], [facility] ONLY)    EKG None  Radiology No results found.  Procedures Procedures (including critical care time)  Medications Ordered in ED Medications - No data to display  ED Course  I have reviewed the triage vital signs and the nursing notes.  Pertinent labs & imaging results that were available during my care of the patient were reviewed by me and considered in my medical decision making (see chart for details).    MDM Rules/Calculators/A&P                      Bonnie Booth is a 19 year old female who presents to the ED with rectal bleeding,  hemorrhoid.  Patient with normal vitals.  No fever.  Patient with constipation ever since her recent pregnancy.  Had some blood in her stool over the last several days.  Took a laxative and some hemorrhoid wipes last night and pain and bleeding have slightly improved.  She does not have any dizziness or lightheadedness.  Has a small hemorrhoid on exam externally.  No abscess or fluctuance is  identified.  No fissure.  Recommend MiraLAX, Anusol.  Educated about hemorrhoids.  No significant anemia.  Pregnancy test negative.  Discharged in good condition.  Given return precautions.  This chart was dictated using voice recognition software.  Despite best efforts to proofread,  errors can occur which can change the documentation meaning.     Final Clinical Impression(s) / ED Diagnoses Final diagnoses:  Hemorrhoids, unspecified hemorrhoid type    Rx / DC Orders ED Discharge Orders         Ordered    hydrocortisone (ANUSOL-HC) 25 MG suppository  2 times daily     07/03/19 1259    polyethylene glycol (MIRALAX / GLYCOLAX) 17 g packet  Daily     07/03/19 1259           Shareeka Yim, Madelaine Bhat, DO 07/03/19 1302

## 2019-07-21 ENCOUNTER — Ambulatory Visit: Payer: Self-pay | Admitting: *Deleted

## 2019-07-21 NOTE — Telephone Encounter (Signed)
Patient calling to see if was safe to take Miralax while breast feeding. Pt states she does not have a OB/GYN and is not sure who prescribed the medication. Pt states she just started to take the medication today. Pt advised to contact her child's pediatrician or the pharmacy for recommendations on taking Miralax while breast feeding. Understanding verbalized.  Reason for Disposition . Maternal medications, questions about  Protocols used: POSTPARTUM - BREASTFEEDING QUESTIONS-A-AH

## 2020-03-04 ENCOUNTER — Other Ambulatory Visit: Payer: Self-pay

## 2020-03-04 ENCOUNTER — Encounter (HOSPITAL_COMMUNITY): Payer: Self-pay

## 2020-03-04 DIAGNOSIS — R102 Pelvic and perineal pain: Secondary | ICD-10-CM | POA: Insufficient documentation

## 2020-03-04 DIAGNOSIS — I10 Essential (primary) hypertension: Secondary | ICD-10-CM | POA: Insufficient documentation

## 2020-03-04 DIAGNOSIS — Z79899 Other long term (current) drug therapy: Secondary | ICD-10-CM | POA: Diagnosis not present

## 2020-03-04 NOTE — ED Triage Notes (Signed)
Pt sts lower abdominal pain for 4 days.

## 2020-03-05 ENCOUNTER — Ambulatory Visit: Payer: Self-pay

## 2020-03-05 ENCOUNTER — Emergency Department (HOSPITAL_COMMUNITY): Payer: Medicaid Other

## 2020-03-05 ENCOUNTER — Emergency Department (HOSPITAL_COMMUNITY)
Admission: EM | Admit: 2020-03-05 | Discharge: 2020-03-05 | Disposition: A | Discharge status: Home / Self Care | Payer: Medicaid Other | Attending: Emergency Medicine | Admitting: Emergency Medicine

## 2020-03-05 DIAGNOSIS — R102 Pelvic and perineal pain: Secondary | ICD-10-CM

## 2020-03-05 LAB — URINALYSIS, ROUTINE W REFLEX MICROSCOPIC
Bacteria, UA: NONE SEEN
Bilirubin Urine: NEGATIVE
Glucose, UA: NEGATIVE mg/dL
Hgb urine dipstick: NEGATIVE
Ketones, ur: NEGATIVE mg/dL
Nitrite: NEGATIVE
Protein, ur: NEGATIVE mg/dL
Specific Gravity, Urine: 1.021 (ref 1.005–1.030)
pH: 5 (ref 5.0–8.0)

## 2020-03-05 LAB — WET PREP, GENITAL
Clue Cells Wet Prep HPF POC: NONE SEEN
Sperm: NONE SEEN
Trich, Wet Prep: NONE SEEN
Yeast Wet Prep HPF POC: NONE SEEN

## 2020-03-05 LAB — CBC
HCT: 39.2 % (ref 36.0–46.0)
Hemoglobin: 11.8 g/dL — ABNORMAL LOW (ref 12.0–15.0)
MCH: 24.1 pg — ABNORMAL LOW (ref 26.0–34.0)
MCHC: 30.1 g/dL (ref 30.0–36.0)
MCV: 80.2 fL (ref 80.0–100.0)
Platelets: 325 10*3/uL (ref 150–400)
RBC: 4.89 MIL/uL (ref 3.87–5.11)
RDW: 14.7 % (ref 11.5–15.5)
WBC: 9.2 10*3/uL (ref 4.0–10.5)
nRBC: 0 % (ref 0.0–0.2)

## 2020-03-05 LAB — COMPREHENSIVE METABOLIC PANEL
ALT: 35 U/L (ref 0–44)
AST: 29 U/L (ref 15–41)
Albumin: 4.1 g/dL (ref 3.5–5.0)
Alkaline Phosphatase: 120 U/L (ref 38–126)
Anion gap: 7 (ref 5–15)
BUN: 21 mg/dL — ABNORMAL HIGH (ref 6–20)
CO2: 28 mmol/L (ref 22–32)
Calcium: 9.3 mg/dL (ref 8.9–10.3)
Chloride: 105 mmol/L (ref 98–111)
Creatinine, Ser: 0.92 mg/dL (ref 0.44–1.00)
GFR, Estimated: >60 mL/min (ref >60)
Glucose, Bld: 101 mg/dL — ABNORMAL HIGH (ref 70–99)
Potassium: 3.7 mmol/L (ref 3.5–5.1)
Sodium: 140 mmol/L (ref 135–145)
Total Bilirubin: 0.5 mg/dL (ref 0.3–1.2)
Total Protein: 7.6 g/dL (ref 6.5–8.1)

## 2020-03-05 LAB — GC/CHLAMYDIA PROBE AMP (~~LOC~~) NOT AT ARMC
Chlamydia: NEGATIVE
Comment: NEGATIVE
Comment: NORMAL
Neisseria Gonorrhea: NEGATIVE

## 2020-03-05 LAB — I-STAT BETA HCG BLOOD, ED (MC, WL, AP ONLY): I-stat hCG, quantitative: <5 m[IU]/mL (ref <5)

## 2020-03-05 LAB — LIPASE, BLOOD: Lipase: 32 U/L (ref 11–51)

## 2020-03-05 MED ORDER — DOXYCYCLINE HYCLATE 100 MG PO CAPS: 100.0000 mg | ORAL_CAPSULE | Freq: Two times a day (BID) | ORAL | 0 refills | Status: DC

## 2020-03-05 MED ORDER — CEFTRIAXONE SODIUM 1 G IJ SOLR
500.0000 mg | Freq: Once | INTRAMUSCULAR | Status: AC
  Administered 2020-03-05: 500 mg via INTRAMUSCULAR
  Filled 2020-03-05: qty 10

## 2020-03-05 NOTE — ED Provider Notes (Signed)
[facility]-EMERGENCY DEPT Provider Note: Bonnie Dell, MD, FACEP  CSN: [id number] MRN: [id number] ARRIVAL: 03/04/20 at 2321 ROOM: WA12/WA12   CHIEF COMPLAINT  Abdominal Pain   HISTORY OF PRESENT ILLNESS  03/05/20 2:08 AM Bonnie Booth is a 19 y.o. female with lower abdominal pain for the past 4 days.  The pain is described as crampy and rated as a 6 out of 10.  It is worse with movement or ambulation.  She has not noticed any vaginal bleeding or discharge.  She has had no fever, chills, dysuria, hematuria, nausea, vomiting or diarrhea.   Past Medical History:  Diagnosis Date  . Anemia   . Hypertension   . Rheumatoid arthritis [facility])     Past Surgical History:  Procedure Laterality Date  . NO PAST SURGERIES      No family history on file.  Social History   Tobacco Use  . Smoking status: Never Smoker  . Smokeless tobacco: Never Used  Vaping Use  . Vaping Use: Never used  Substance Use Topics  . Alcohol use: No  . Drug use: Yes    Types: Marijuana    Comment: pt states years ago    Prior to Admission medications   Medication Sig Start Date End Date Taking? Authorizing Provider  doxycycline (VIBRAMYCIN) 100 MG capsule Take 1 capsule (100 mg total) by mouth 2 (two) times daily. One po bid x 7 days 03/05/20   Meerab Maselli, MD  enalapril (VASOTEC) 5 MG tablet Take 1 tablet (5 mg total) by mouth daily. Patient not taking: Reported on 03/05/2020 05/21/19 03/05/20  Shirlean Mylar, MD  ferrous sulfate 325 (65 FE) MG tablet Take 1 tablet (325 mg total) by mouth every other day. Patient not taking: Reported on 03/05/2020 05/22/19 03/05/20  Shirlean Mylar, MD    Allergies Apple, Bee venom, Cinnamon, Mango flavor [flavoring agent], Pineapple, Prunus persica, and Tomato   REVIEW OF SYSTEMS  Negative except as noted here or in the History of Present Illness.   PHYSICAL EXAMINATION  Initial Vital Signs Blood pressure (!) 95/58, pulse 67, temperature 98.5 F (36.9 C), resp.  rate 16, height 5\' 7"  (1.702 m), weight 97.5 kg, SpO2 97 %, unknown if currently breastfeeding.  Examination General: Well-developed, well-nourished female in no acute distress; appearance consistent with age of record HENT: normocephalic; atraumatic Eyes: pupils equal, round and reactive to light; extraocular muscles intact Neck: supple Heart: regular rate and rhythm Lungs: clear to auscultation bilaterally Abdomen: soft; nondistended; mild suprapubic tenderness; bowel sounds present GU: Normal external genitalia; cervical erythema; IUD strings present in the cervical os; slight amount of greenish discharge; no vaginal bleeding; no cervical motion tenderness; right adnexal tenderness Extremities: No deformity; full range of motion; pulses normal Neurologic: Awake, alert and oriented; motor function intact in all extremities and symmetric; no facial droop Skin: Warm and dry Psychiatric: Normal mood and affect   RESULTS  Summary of this visit's results, reviewed and interpreted by myself:   EKG Interpretation  Date/Time:    Ventricular Rate:    PR Interval:    QRS Duration:   QT Interval:    QTC Calculation:   R Axis:     Text Interpretation:        Laboratory Studies: Results for orders placed or performed during the hospital encounter of 03/05/20 (from the past 24 hour(s))  Lipase, blood     Status: None   Collection Time: 03/04/20 11:42 PM  Result Value Ref Range   Lipase 32  11 - 51 U/L  Comprehensive metabolic panel     Status: Abnormal   Collection Time: 03/04/20 11:42 PM  Result Value Ref Range   Sodium 140 135 - 145 mmol/L   Potassium 3.7 3.5 - 5.1 mmol/L   Chloride 105 98 - 111 mmol/L   CO2 28 22 - 32 mmol/L   Glucose, Bld 101 (H) 70 - 99 mg/dL   BUN 21 (H) 6 - 20 mg/dL   Creatinine, Ser 4.40 0.44 - 1.00 mg/dL   Calcium 9.3 8.9 - 10.2 mg/dL   Total Protein 7.6 6.5 - 8.1 g/dL   Albumin 4.1 3.5 - 5.0 g/dL   AST 29 15 - 41 U/L   ALT 35 0 - 44 U/L   Alkaline  Phosphatase 120 38 - 126 U/L   Total Bilirubin 0.5 0.3 - 1.2 mg/dL   GFR, Estimated >72 >53 mL/min   Anion gap 7 5 - 15  CBC     Status: Abnormal   Collection Time: 03/04/20 11:42 PM  Result Value Ref Range   WBC 9.2 4.0 - 10.5 K/uL   RBC 4.89 3.87 - 5.11 MIL/uL   Hemoglobin 11.8 (L) 12.0 - 15.0 g/dL   HCT 66.4 36 - 46 %   MCV 80.2 80.0 - 100.0 fL   MCH 24.1 (L) 26.0 - 34.0 pg   MCHC 30.1 30.0 - 36.0 g/dL   RDW 40.3 47.4 - 25.9 %   Platelets 325 150 - 400 K/uL   nRBC 0.0 0.0 - 0.2 %  Urinalysis, Routine w reflex microscopic Urine, Clean Catch     Status: Abnormal   Collection Time: 03/04/20 11:53 PM  Result Value Ref Range   Color, Urine YELLOW YELLOW   APPearance CLEAR CLEAR   Specific Gravity, Urine 1.021 1.005 - 1.030   pH 5.0 5.0 - 8.0   Glucose, UA NEGATIVE NEGATIVE mg/dL   Hgb urine dipstick NEGATIVE NEGATIVE   Bilirubin Urine NEGATIVE NEGATIVE   Ketones, ur NEGATIVE NEGATIVE mg/dL   Protein, ur NEGATIVE NEGATIVE mg/dL   Nitrite NEGATIVE NEGATIVE   Leukocytes,Ua TRACE (A) NEGATIVE   RBC / HPF 0-5 0 - 5 RBC/hpf   WBC, UA 0-5 0 - 5 WBC/hpf   Bacteria, UA NONE SEEN NONE SEEN   Squamous Epithelial / LPF 0-5 0 - 5  I-Stat beta hCG blood, ED     Status: None   Collection Time: 03/04/20 11:57 PM  Result Value Ref Range   I-stat hCG, quantitative <5.0 <5 mIU/mL   Comment 3          Wet prep, genital     Status: Abnormal   Collection Time: 03/05/20  2:12 AM   Specimen: Cervix  Result Value Ref Range   Yeast Wet Prep HPF POC NONE SEEN NONE SEEN   Trich, Wet Prep NONE SEEN NONE SEEN   Clue Cells Wet Prep HPF POC NONE SEEN NONE SEEN   WBC, Wet Prep HPF POC MODERATE (A) NONE SEEN   Sperm NONE SEEN    Imaging Studies: US PELVIC COMPLETE WITH TRANSVAGINAL  Result Date: 03/05/2020 CLINICAL DATA:  Four days of pelvic pain EXAM: TRANSABDOMINAL AND TRANSVAGINAL ULTRASOUND OF PELVIS TECHNIQUE: Both transabdominal and transvaginal ultrasound examinations of the pelvis were  performed. Transabdominal technique was performed for global imaging of the pelvis including uterus, ovaries, adnexal regions, and pelvic cul-de-sac. It was necessary to proceed with endovaginal exam following the transabdominal exam to visualize the uterus, endometrium, and ovaries. COMPARISON:  Obstetrical  ultrasound 02/17/2019 FINDINGS: Uterus Measurements: 6.7 x 2.8 x 4.0 cm = volume: 38.7 mL. Anteverted uterus. No concerning myometrial abnormality. No discernible fibroids. Endometrium Thickness: 2.8 mm. Echogenic IUD positioned within the endometrium. No concerning abnormalities. Right ovary Measurements: 2.4 x 2.0 x 2.1 cm = volume: 5.2 mL. Normal appearance. Normal follicles. No concerning adnexal abnormality. Left ovary Measurements: 2.8 x 2.2 x 1.7 cm = volume: 5.4 mL. Normal appearance. Normal follicles. No concerning adnexal abnormality. Other findings No abnormal free fluid. IMPRESSION: 1. IUD positioned within the endometrium. 2. Otherwise unremarkable pelvic ultrasound. Electronically Signed   By: Kreg Shropshire M.D.   On: 03/05/2020 03:09    ED COURSE and MDM  Nursing notes, initial and subsequent vitals signs, including pulse oximetry, reviewed and interpreted by myself.  Vitals:   03/05/20 0100 03/05/20 0130 03/05/20 0200 03/05/20 0230  BP: (!) 119/57 (!) 95/58 (!) 96/48 122/70  Pulse: 77 67 82 79  Resp: 16 16 16 16   Temp:      TempSrc:      SpO2: 99% 97% 98% 99%  Weight:      Height:       Medications  cefTRIAXone (ROCEPHIN) injection 500 mg (has no administration in time range)    3:20 AM The patient's cervix is erythematous and, given that she has an IUD, is that somewhat higher risk for pelvic infection.  We will go ahead and treat her with Rocephin and doxycycline.  Gonorrhea and Chlamydia testing pending.  PROCEDURES  Procedures   ED DIAGNOSES     ICD-10-CM   1. Pelvic pain  R10.2 US PELVIC COMPLETE WITH TRANSVAGINAL    US PELVIC COMPLETE WITH TRANSVAGINAL        Lashundra Shiveley, MD 03/05/20 [phone]

## 2020-03-05 NOTE — Telephone Encounter (Signed)
   HB   Hendel S. Curnow Female, 19 y.o., [birth year] MRN:  [id number] Phone:  [phone] Judie Petit) PCP:  Iona Hansen, NP Coverage:  Medicaid [place]/Medicaid [facility] from Stephannie Li sent at 03/05/2020 2:00 PM EST  Patient would like to speak with a nurse concerning her recent ed visit.She has questions from her my chart account that she does not understand.please call her at [phone]   Call History   Type Contact Phone User  03/05/2020 02:00 PM EST Phone (Incoming) [place], Deonna S (Self)  Sharol Harness, Janett L   Pt. Calling. States she saw lab results on My Chart and wanted to verify results. Verbalizes understanding.

## 2020-04-04 ENCOUNTER — Emergency Department (HOSPITAL_COMMUNITY)
Admission: EM | Admit: 2020-04-04 | Discharge: 2020-04-04 | Disposition: A | Discharge status: Home / Self Care | Payer: Medicaid Other | Attending: Emergency Medicine | Admitting: Emergency Medicine

## 2020-04-04 DIAGNOSIS — Z20822 Contact with and (suspected) exposure to covid-19: Secondary | ICD-10-CM | POA: Insufficient documentation

## 2020-04-04 DIAGNOSIS — R070 Pain in throat: Secondary | ICD-10-CM | POA: Diagnosis present

## 2020-04-04 DIAGNOSIS — I10 Essential (primary) hypertension: Secondary | ICD-10-CM | POA: Insufficient documentation

## 2020-04-04 DIAGNOSIS — Z79899 Other long term (current) drug therapy: Secondary | ICD-10-CM | POA: Insufficient documentation

## 2020-04-04 DIAGNOSIS — J029 Acute pharyngitis, unspecified: Secondary | ICD-10-CM | POA: Insufficient documentation

## 2020-04-04 LAB — GROUP A STREP BY PCR: Group A Strep by PCR: NOT DETECTED

## 2020-04-04 LAB — RESP PANEL BY RT-PCR (FLU A&B, COVID) ARPGX2
Influenza A by PCR: NEGATIVE
Influenza B by PCR: NEGATIVE
SARS Coronavirus 2 by RT PCR: NEGATIVE

## 2020-04-04 NOTE — ED Triage Notes (Signed)
Presents via EMS with c/o sore throat for 2 days. Denies any other symptoms or complaints.

## 2020-04-04 NOTE — ED Provider Notes (Signed)
[facility]-EMERGENCY DEPT Provider Note   CSN: [id number] Arrival date & time: 04/04/20  5784     History Chief Complaint  Patient presents with  . Sore Throat    Bonnie Booth is a 19 y.o. female.  Patient presents the emergency department for evaluation of sore throat.  She has had symptoms for 2 days.  Pain is worse with swallowing.  No fever, N/V/D.  No cough, ear pain, runny nose.  Using OTC meds and eating soup at home to treat symptoms.  + COVID vaccination status.  The onset of this condition was acute. The course is constant.          Past Medical History:  Diagnosis Date  . Anemia   . Hypertension   . Rheumatoid arthritis [facility])     Patient Active Problem List   Diagnosis Date Noted  . IUD (intrauterine device) in place 05/26/2019  . Perineal laceration, second degree 05/20/2019  . Normal spontaneous vaginal delivery 05/20/2019  . Labor and delivery, indication for care 05/19/2019  . Preeclampsia 05/19/2019  . Gestational diabetes 03/16/2019  . Alpha thalassemia silent carrier 12/02/2018  . Supervision of other normal pregnancy, antepartum 11/04/2018    Past Surgical History:  Procedure Laterality Date  . NO PAST SURGERIES       OB History    Gravida  3   Para  1   Term  1   Preterm      AB  2   Living  1     SAB  2   TAB      Ectopic      Multiple  0   Live Births  1           No family history on file.  Social History   Tobacco Use  . Smoking status: Never Smoker  . Smokeless tobacco: Never Used  Vaping Use  . Vaping Use: Never used  Substance Use Topics  . Alcohol use: No  . Drug use: Yes    Types: Marijuana    Comment: pt states years ago    Home Medications Prior to Admission medications   Medication Sig Start Date End Date Taking? Authorizing Provider  doxycycline (VIBRAMYCIN) 100 MG capsule Take 1 capsule (100 mg total) by mouth 2 (two) times daily. One po bid x 7 days 03/05/20    Molpus, John, MD  enalapril (VASOTEC) 5 MG tablet Take 1 tablet (5 mg total) by mouth daily. Patient not taking: Reported on 03/05/2020 05/21/19 03/05/20  Shirlean Mylar, MD  ferrous sulfate 325 (65 FE) MG tablet Take 1 tablet (325 mg total) by mouth every other day. Patient not taking: Reported on 03/05/2020 05/22/19 03/05/20  Shirlean Mylar, MD    Allergies    Apple, Bee venom, Cinnamon, Mango flavor [flavoring agent], Pineapple, Prunus persica, and Tomato  Review of Systems   Review of Systems  Constitutional: Negative for fever.  HENT: Positive for sore throat. Negative for rhinorrhea.   Eyes: Negative for redness.  Respiratory: Negative for cough.   Cardiovascular: Negative for chest pain.  Gastrointestinal: Negative for abdominal pain, diarrhea, nausea and vomiting.  Genitourinary: Negative for dysuria, frequency, hematuria and urgency.  Musculoskeletal: Negative for myalgias.  Skin: Negative for rash.  Neurological: Negative for headaches.    Physical Exam Updated Vital Signs BP 121/70 (BP Location: Right Arm)   Pulse (!) 104   Temp 98.9 F (37.2 C) (Oral)   Resp 18   SpO2 99%  Physical Exam Vitals and nursing note reviewed.  Constitutional:      Appearance: She is well-developed.  HENT:     Head: Normocephalic and atraumatic.     Jaw: No trismus.     Right Ear: Tympanic membrane, ear canal and external ear normal.     Left Ear: Tympanic membrane, ear canal and external ear normal.     Nose: Nose normal. No mucosal edema or rhinorrhea.     Mouth/Throat:     Mouth: Mucous membranes are not dry. No oral lesions.     Pharynx: Uvula midline. Posterior oropharyngeal erythema present. No oropharyngeal exudate or uvula swelling.     Tonsils: No tonsillar abscesses.  Eyes:     General:        Right eye: No discharge.        Left eye: No discharge.     Conjunctiva/sclera: Conjunctivae normal.  Cardiovascular:     Rate and Rhythm: Normal rate and regular rhythm.      Heart sounds: Normal heart sounds.  Pulmonary:     Effort: Pulmonary effort is normal. No respiratory distress.     Breath sounds: Normal breath sounds. No wheezing or rales.  Abdominal:     Palpations: Abdomen is soft.     Tenderness: There is no abdominal tenderness.  Musculoskeletal:     Cervical back: Normal range of motion and neck supple.  Lymphadenopathy:     Cervical: No cervical adenopathy.  Skin:    General: Skin is warm and dry.  Neurological:     Mental Status: She is alert.     ED Results / Procedures / Treatments   Labs (all labs ordered are listed, but only abnormal results are displayed) Labs Reviewed  RESP PANEL BY RT-PCR (FLU A&B, COVID) [id number]  GROUP A STREP BY PCR    EKG None  Radiology No results found.  Procedures Procedures (including critical care time)  Medications Ordered in ED Medications - No data to display  ED Course  I have reviewed the triage vital signs and the nursing notes.  Pertinent labs & imaging results that were available during my care of the patient were reviewed by me and considered in my medical decision making (see chart for details).  Patient seen and examined. Work-up initiated. Plan: strep, COVID test, d/c home.  Pt looks well.   Vital signs reviewed and are as follows: BP 121/70 (BP Location: Right Arm)   Pulse (!) 104   Temp 98.9 F (37.2 C) (Oral)   Resp 18   SpO2 99%   9:53 AM strep test and Covid testing negative. Patient updated and informed.  Patient counseled on supportive care for viral URI and s/s to return including worsening symptoms, persistent fever, persistent vomiting, or if they have any other concerns. Urged to see PCP if symptoms persist for more than 3 days. Patient verbalizes understanding and agrees with plan.      MDM Rules/Calculators/A&P                          Sore throat: Negative strep and Covid test. No signs of peritonsillar abscess. For range of motion of neck, doubt  epiglottitis, retropharyngeal abscess. Patient looks well, nontoxic. Supportive care indicated at this point.  Final Clinical Impression(s) / ED Diagnoses Final diagnoses:  Pharyngitis, unspecified etiology    Rx / DC Orders ED Discharge Orders    None       Renne Crigler, PA-C  04/04/20 0786    Bethann Berkshire, MD 04/04/20 1253

## 2020-04-04 NOTE — Discharge Instructions (Signed)
Please read and follow all provided instructions.  Your diagnoses today include:  1. Pharyngitis, unspecified etiology     Tests performed today include:  Strep test: was negative for strep throat  COVID test: was negative  Vital signs. See below for your results today.   Medications prescribed:  Please use over-the-counter NSAID medications (ibuprofen, naproxen) as directed on the packaging for pain.   Home care instructions:  Please read the educational materials provided and follow any instructions contained in this packet.  Follow-up instructions: Please follow-up with your primary care provider as needed for further evaluation of your symptoms.  Return instructions:   Please return to the Emergency Department if you experience worsening symptoms.   Return if you have worsening problems swallowing, your neck becomes swollen, you cannot swallow your saliva or your voice becomes muffled.   Return with high persistent fever, persistent vomiting, or if you have trouble breathing.   Please return if you have any other emergent concerns.  Additional Information:  Your vital signs today were: BP 121/70 (BP Location: Right Arm)   Pulse (!) 104   Temp 98.9 F (37.2 C) (Oral)   Resp 18   SpO2 99%  If your blood pressure (BP) was elevated above 135/85 this visit, please have this repeated by your doctor within one month. --------------

## 2020-05-29 HISTORY — PX: APPENDECTOMY: SHX54

## 2020-06-20 ENCOUNTER — Observation Stay (HOSPITAL_BASED_OUTPATIENT_CLINIC_OR_DEPARTMENT_OTHER)
Admission: EM | Admit: 2020-06-20 | Discharge: 2020-06-22 | Disposition: A | Discharge status: Home / Self Care | Payer: Medicaid Other | Attending: Surgery | Admitting: Surgery

## 2020-06-20 ENCOUNTER — Encounter (HOSPITAL_BASED_OUTPATIENT_CLINIC_OR_DEPARTMENT_OTHER): Payer: Self-pay

## 2020-06-20 ENCOUNTER — Other Ambulatory Visit: Payer: Self-pay | Admitting: General Surgery

## 2020-06-20 ENCOUNTER — Other Ambulatory Visit: Payer: Self-pay

## 2020-06-20 ENCOUNTER — Emergency Department (HOSPITAL_BASED_OUTPATIENT_CLINIC_OR_DEPARTMENT_OTHER): Payer: Medicaid Other

## 2020-06-20 DIAGNOSIS — K3580 Unspecified acute appendicitis: Principal | ICD-10-CM | POA: Diagnosis present

## 2020-06-20 DIAGNOSIS — I10 Essential (primary) hypertension: Secondary | ICD-10-CM | POA: Insufficient documentation

## 2020-06-20 DIAGNOSIS — R1031 Right lower quadrant pain: Secondary | ICD-10-CM | POA: Diagnosis present

## 2020-06-20 DIAGNOSIS — T839XXA Unspecified complication of genitourinary prosthetic device, implant and graft, initial encounter: Secondary | ICD-10-CM

## 2020-06-20 DIAGNOSIS — Z79899 Other long term (current) drug therapy: Secondary | ICD-10-CM | POA: Insufficient documentation

## 2020-06-20 DIAGNOSIS — K37 Unspecified appendicitis: Secondary | ICD-10-CM

## 2020-06-20 DIAGNOSIS — Z20822 Contact with and (suspected) exposure to covid-19: Secondary | ICD-10-CM | POA: Diagnosis not present

## 2020-06-20 DIAGNOSIS — K353 Acute appendicitis with localized peritonitis, without perforation or gangrene: Secondary | ICD-10-CM

## 2020-06-20 HISTORY — DX: Unspecified appendicitis: K37

## 2020-06-20 HISTORY — DX: Unspecified acute appendicitis: K35.80

## 2020-06-20 LAB — CBC WITH DIFFERENTIAL/PLATELET
Abs Immature Granulocytes: 0.04 10*3/uL (ref 0.00–0.07)
Basophils Absolute: 0 10*3/uL (ref 0.0–0.1)
Basophils Relative: 0 %
Eosinophils Absolute: 0 10*3/uL (ref 0.0–0.5)
Eosinophils Relative: 0 %
HCT: 40.5 % (ref 36.0–46.0)
Hemoglobin: 12.3 g/dL (ref 12.0–15.0)
Immature Granulocytes: 0 %
Lymphocytes Relative: 16 %
Lymphs Abs: 1.9 10*3/uL (ref 0.7–4.0)
MCH: 24.5 pg — ABNORMAL LOW (ref 26.0–34.0)
MCHC: 30.4 g/dL (ref 30.0–36.0)
MCV: 80.7 fL (ref 80.0–100.0)
Monocytes Absolute: 0.6 10*3/uL (ref 0.1–1.0)
Monocytes Relative: 5 %
Neutro Abs: 8.8 10*3/uL — ABNORMAL HIGH (ref 1.7–7.7)
Neutrophils Relative %: 79 %
Platelets: 319 10*3/uL (ref 150–400)
RBC: 5.02 MIL/uL (ref 3.87–5.11)
RDW: 14.4 % (ref 11.5–15.5)
WBC: 11.4 10*3/uL — ABNORMAL HIGH (ref 4.0–10.5)
nRBC: 0 % (ref 0.0–0.2)

## 2020-06-20 LAB — COMPREHENSIVE METABOLIC PANEL
ALT: 27 U/L (ref 0–44)
AST: 20 U/L (ref 15–41)
Albumin: 4 g/dL (ref 3.5–5.0)
Alkaline Phosphatase: 111 U/L (ref 38–126)
Anion gap: 9 (ref 5–15)
BUN: 13 mg/dL (ref 6–20)
CO2: 26 mmol/L (ref 22–32)
Calcium: 9 mg/dL (ref 8.9–10.3)
Chloride: 101 mmol/L (ref 98–111)
Creatinine, Ser: 0.71 mg/dL (ref 0.44–1.00)
GFR, Estimated: >60 mL/min (ref >60)
Glucose, Bld: 118 mg/dL — ABNORMAL HIGH (ref 70–99)
Potassium: 3.6 mmol/L (ref 3.5–5.1)
Sodium: 136 mmol/L (ref 135–145)
Total Bilirubin: 0.5 mg/dL (ref 0.3–1.2)
Total Protein: 8 g/dL (ref 6.5–8.1)

## 2020-06-20 LAB — WET PREP, GENITAL
Clue Cells Wet Prep HPF POC: NONE SEEN
Sperm: NONE SEEN
Trich, Wet Prep: NONE SEEN
Yeast Wet Prep HPF POC: NONE SEEN

## 2020-06-20 LAB — URINALYSIS, ROUTINE W REFLEX MICROSCOPIC
Bilirubin Urine: NEGATIVE
Glucose, UA: NEGATIVE mg/dL
Hgb urine dipstick: NEGATIVE
Ketones, ur: NEGATIVE mg/dL
Leukocytes,Ua: NEGATIVE
Nitrite: NEGATIVE
Protein, ur: NEGATIVE mg/dL
Specific Gravity, Urine: 1.025 (ref 1.005–1.030)
pH: 6 (ref 5.0–8.0)

## 2020-06-20 LAB — LIPASE, BLOOD: Lipase: 27 U/L (ref 11–51)

## 2020-06-20 LAB — RESP PANEL BY RT-PCR (FLU A&B, COVID) ARPGX2
Influenza A by PCR: NEGATIVE
Influenza B by PCR: NEGATIVE
SARS Coronavirus 2 by RT PCR: NEGATIVE

## 2020-06-20 LAB — PREGNANCY, URINE: Preg Test, Ur: NEGATIVE

## 2020-06-20 MED ORDER — SODIUM CHLORIDE 0.9 % IV SOLN
2.0000 g | Freq: Once | INTRAVENOUS | Status: AC
  Administered 2020-06-20: 2 g via INTRAVENOUS
  Filled 2020-06-20: qty 20

## 2020-06-20 MED ORDER — IOHEXOL 300 MG/ML  SOLN
100.0000 mL | Freq: Once | INTRAMUSCULAR | Status: AC | PRN
  Administered 2020-06-20: 100 mL via INTRAVENOUS

## 2020-06-20 MED ORDER — ONDANSETRON HCL 4 MG/2ML IJ SOLN
4.0000 mg | Freq: Four times a day (QID) | INTRAMUSCULAR | Status: DC | PRN
  Administered 2020-06-20 – 2020-06-21 (×3): 4 mg via INTRAVENOUS
  Filled 2020-06-20 (×2): qty 2

## 2020-06-20 MED ORDER — MORPHINE SULFATE (PF) 4 MG/ML IV SOLN
4.0000 mg | Freq: Once | INTRAVENOUS | Status: DC
  Filled 2020-06-20: qty 1

## 2020-06-20 MED ORDER — SODIUM CHLORIDE 0.9 % IV BOLUS
1000.0000 mL | Freq: Once | INTRAVENOUS | Status: AC
  Administered 2020-06-20: 1000 mL via INTRAVENOUS

## 2020-06-20 MED ORDER — ONDANSETRON 4 MG PO TBDP: 4.0000 mg | ORAL_TABLET | Freq: Four times a day (QID) | ORAL | Status: DC | PRN

## 2020-06-20 MED ORDER — METRONIDAZOLE IN NACL 5-0.79 MG/ML-% IV SOLN
500.0000 mg | Freq: Three times a day (TID) | INTRAVENOUS | Status: DC
  Administered 2020-06-21 (×2): 500 mg via INTRAVENOUS
  Filled 2020-06-20 (×2): qty 100

## 2020-06-20 MED ORDER — SODIUM CHLORIDE 0.9 % IV SOLN
2.0000 g | INTRAVENOUS | Status: DC
  Filled 2020-06-20: qty 20

## 2020-06-20 MED ORDER — MORPHINE SULFATE (PF) 2 MG/ML IV SOLN: 2.0000 mg | INTRAVENOUS | Status: DC | PRN

## 2020-06-20 MED ORDER — DIPHENHYDRAMINE HCL 50 MG/ML IJ SOLN: 25.0000 mg | Freq: Four times a day (QID) | INTRAMUSCULAR | Status: DC | PRN

## 2020-06-20 MED ORDER — KCL IN DEXTROSE-NACL 20-5-0.45 MEQ/L-%-% IV SOLN
INTRAVENOUS | Status: DC
  Filled 2020-06-20: qty 1000

## 2020-06-20 MED ORDER — DIPHENHYDRAMINE HCL 25 MG PO CAPS: 25.0000 mg | ORAL_CAPSULE | Freq: Four times a day (QID) | ORAL | Status: DC | PRN

## 2020-06-20 MED ORDER — ONDANSETRON HCL 4 MG/2ML IJ SOLN
4.0000 mg | Freq: Once | INTRAMUSCULAR | Status: DC
  Filled 2020-06-20: qty 2

## 2020-06-20 MED ORDER — DOCUSATE SODIUM 100 MG PO CAPS: 100.0000 mg | ORAL_CAPSULE | Freq: Two times a day (BID) | ORAL | Status: DC

## 2020-06-20 MED ORDER — SIMETHICONE 80 MG PO CHEW: 40.0000 mg | CHEWABLE_TABLET | Freq: Four times a day (QID) | ORAL | Status: DC | PRN

## 2020-06-20 MED ORDER — ENOXAPARIN SODIUM 40 MG/0.4ML ~~LOC~~ SOLN
40.0000 mg | SUBCUTANEOUS | Status: DC
  Administered 2020-06-20: 40 mg via SUBCUTANEOUS
  Filled 2020-06-20: qty 0.4

## 2020-06-20 MED ORDER — METRONIDAZOLE IN NACL 5-0.79 MG/ML-% IV SOLN
500.0000 mg | Freq: Once | INTRAVENOUS | Status: AC
  Administered 2020-06-20: 500 mg via INTRAVENOUS
  Filled 2020-06-20: qty 100

## 2020-06-20 NOTE — H&P (Signed)
CC: RLQ pain  HPI: Bonnie Booth is an 20 y.o. female who is here for 3 days of increasing abd pain located in lower abdomen.  Associated with nausea.    She has rheumatoid arthritis but does not take medication for this.    Past Medical History:  Diagnosis Date   Anemia    Hypertension    Rheumatoid arthritis (HCC)     Past Surgical History:  Procedure Laterality Date   NO PAST SURGERIES      No family history on file.  Social:  reports that she has never smoked. She has never used smokeless tobacco. She reports previous drug use. She reports that she does not drink alcohol.  Allergies:  Allergies  Allergen Reactions   Apple Anaphylaxis and Swelling    Patient's throat swells   Bee Venom Anaphylaxis and Swelling    Patient's throat swells Patient's throat swells   Cinnamon Anaphylaxis and Swelling    Patient's throat swells   Mango Flavor [Flavoring Agent] Anaphylaxis and Swelling    Patient's throat swells   Pineapple Anaphylaxis and Swelling    Patient's throat swells   Prunus Persica Anaphylaxis, Swelling and Rash   Tomato Anaphylaxis and Nausea And Vomiting   Peach Flavor     Medications: I have reviewed the patient's current medications.  Results for orders placed or performed during the hospital encounter of 06/20/20 (from the past 48 hour(s))  Comprehensive metabolic panel     Status: Abnormal   Collection Time: 06/20/20  4:34 PM  Result Value Ref Range   Sodium 136 135 - 145 mmol/L   Potassium 3.6 3.5 - 5.1 mmol/L   Chloride 101 98 - 111 mmol/L   CO2 26 22 - 32 mmol/L   Glucose, Bld 118 (H) 70 - 99 mg/dL    Comment: Glucose reference range applies only to samples taken after fasting for at least 8 hours.   BUN 13 6 - 20 mg/dL   Creatinine, Ser 5.62 0.44 - 1.00 mg/dL   Calcium 9.0 8.9 - 13.0 mg/dL   Total Protein 8.0 6.5 - 8.1 g/dL   Albumin 4.0 3.5 - 5.0 g/dL   AST 20 15 - 41 U/L   ALT 27 0 - 44 U/L   Alkaline Phosphatase 111 38 - 126 U/L    Total Bilirubin 0.5 0.3 - 1.2 mg/dL   GFR, Estimated >86 >57 mL/min    Comment: (NOTE) Calculated using the CKD-EPI Creatinine Equation (2021)    Anion gap 9 5 - 15    Comment: Performed at [facility], [address] [place], [place] [address]  CBC with Differential     Status: Abnormal   Collection Time: 06/20/20  4:34 PM  Result Value Ref Range   WBC 11.4 (H) 4.0 - 10.5 K/uL   RBC 5.02 3.87 - 5.11 MIL/uL   Hemoglobin 12.3 12.0 - 15.0 g/dL   HCT 29.5 28.4 - 13.2 %   MCV 80.7 80.0 - 100.0 fL   MCH 24.5 (L) 26.0 - 34.0 pg   MCHC 30.4 30.0 - 36.0 g/dL   RDW 44.0 10.2 - 72.5 %   Platelets 319 150 - 400 K/uL   nRBC 0.0 0.0 - 0.2 %   Neutrophils Relative % 79 %   Neutro Abs 8.8 (H) 1.7 - 7.7 K/uL   Lymphocytes Relative 16 %   Lymphs Abs 1.9 0.7 - 4.0 K/uL   Monocytes Relative 5 %   Monocytes Absolute 0.6  0.1 - 1.0 K/uL   Eosinophils Relative 0 %   Eosinophils Absolute 0.0 0.0 - 0.5 K/uL   Basophils Relative 0 %   Basophils Absolute 0.0 0.0 - 0.1 K/uL   Immature Granulocytes 0 %   Abs Immature Granulocytes 0.04 0.00 - 0.07 K/uL    Comment: Performed at [facility], [address] [place], [place] [address]  Lipase, blood     Status: None   Collection Time: 06/20/20  4:34 PM  Result Value Ref Range   Lipase 27 11 - 51 U/L    Comment: Performed at [facility], [address] [place], [place] [address]  Urinalysis, Routine w reflex microscopic     Status: None   Collection Time: 06/20/20  4:34 PM  Result Value Ref Range   Color, Urine YELLOW YELLOW   APPearance CLEAR CLEAR   Specific Gravity, Urine 1.025 1.005 - 1.030   pH 6.0 5.0 - 8.0   Glucose, UA NEGATIVE NEGATIVE mg/dL   Hgb urine dipstick NEGATIVE NEGATIVE   Bilirubin Urine NEGATIVE NEGATIVE   Ketones, ur NEGATIVE NEGATIVE mg/dL   Protein, ur NEGATIVE NEGATIVE mg/dL   Nitrite NEGATIVE NEGATIVE   Leukocytes,Ua NEGATIVE NEGATIVE    Comment: Microscopic not done on urines with  negative protein, blood, leukocytes, nitrite, or glucose < 500 mg/dL. Performed at [facility], [address] [place], [place] [address]   Pregnancy, urine     Status: None   Collection Time: 06/20/20  4:34 PM  Result Value Ref Range   Preg Test, Ur NEGATIVE NEGATIVE    Comment:        THE SENSITIVITY OF THIS METHODOLOGY IS >20 mIU/mL. Performed at [facility], [address] [place], [place] [address]   Wet prep, genital     Status: Abnormal   Collection Time: 06/20/20  4:49 PM  Result Value Ref Range   Yeast Wet Prep HPF POC NONE SEEN NONE SEEN   Trich, Wet Prep NONE SEEN NONE SEEN   Clue Cells Wet Prep HPF POC NONE SEEN NONE SEEN   WBC, Wet Prep HPF POC MANY (A) NONE SEEN   Sperm NONE SEEN     Comment: Performed at [facility], [address] [place], [place] [address]    CT Abdomen Pelvis W Contrast  Result Date: 06/20/2020 CLINICAL DATA:  Right lower quadrant pain EXAM: CT ABDOMEN AND PELVIS WITH CONTRAST TECHNIQUE: Multidetector CT imaging of the abdomen and pelvis was performed using the standard protocol following bolus administration of intravenous contrast. CONTRAST:  OMNIPAQUE IOHEXOL 300 MG/ML  SOLN COMPARISON:  None. FINDINGS: Lower chest: No acute abnormality. Hepatobiliary: No focal liver abnormality is seen. No gallstones, gallbladder wall thickening, or biliary dilatation. Pancreas: Unremarkable. No pancreatic ductal dilatation or surrounding inflammatory changes. Spleen: Normal in size without focal abnormality. Adrenals/Urinary Tract: Normal appearance of the adrenal glands. No kidney mass or hydronephrosis. Urinary bladder is normal. Stomach/Bowel: The stomach is unremarkable. The appendix is identified within the right iliac fossa. The appendix is thickened and inflamed measuring 1.3 cm in maximum thickness. Peri appendiceal soft tissue stranding compatible with inflammation. Small volume of free fluid is noted within right  posterior pelvis. Vascular/Lymphatic: No significant vascular findings are present. No enlarged abdominal or pelvic lymph nodes. Reproductive: Uterus and bilateral adnexa are unremarkable. IUD is identified within the uterus. The arms of the IUD are perpendicular to the endometrium and appear  to be situated within the anterior and posterior wall of the uterus. Other: No loculated fluid collections. Musculoskeletal: No acute or significant osseous findings. IMPRESSION: 1. Examination is positive for acute appendicitis. No evidence for perforation or abscess. 2. IUD is identified within the uterus. Arms of the IUD are perpendicular endometrial cavity and appear to be within the anterior and posterior wall the uterus. Advise follow-up with patient's OBGYN provider. Electronically Signed   By: Signa Kell M.D.   On: 06/20/2020 18:54    ROS - all of the below systems have been reviewed with the patient and positives are indicated with bold text General: chills, fever or night sweats Eyes: blurry vision or double vision ENT: epistaxis or sore throat Allergy/Immunology: itchy/watery eyes or nasal congestion Hematologic/Lymphatic: bleeding problems, blood clots or swollen lymph nodes Endocrine: temperature intolerance or unexpected weight changes Breast: new or changing breast lumps or nipple discharge Resp: cough, shortness of breath, or wheezing CV: chest pain or dyspnea on exertion GI: as per HPI GU: dysuria, trouble voiding, or hematuria MSK: joint pain or joint stiffness Neuro: TIA or stroke symptoms Derm: pruritus and skin lesion changes Psych: anxiety and depression  PE  Constitutional: NAD; conversant; no deformities Eyes: Moist conjunctiva; no lid lag; anicteric; PERRL Neck: Trachea midline; no thyromegaly Lungs: Normal respiratory effort; no tactile fremitus CV: RRR; no palpable thrills; no pitting edema GI: Abd TTP RLQ; no palpable hepatosplenomegaly MSK: Normal range of motion of  extremities; no clubbing/cyanosis Psychiatric: Appropriate affect; alert and oriented x3 Lymphatic: No palpable cervical or axillary lymphadenopathy  Results for orders placed or performed during the hospital encounter of 06/20/20 (from the past 48 hour(s))  Comprehensive metabolic panel     Status: Abnormal   Collection Time: 06/20/20  4:34 PM  Result Value Ref Range   Sodium 136 135 - 145 mmol/L   Potassium 3.6 3.5 - 5.1 mmol/L   Chloride 101 98 - 111 mmol/L   CO2 26 22 - 32 mmol/L   Glucose, Bld 118 (H) 70 - 99 mg/dL    Comment: Glucose reference range applies only to samples taken after fasting for at least 8 hours.   BUN 13 6 - 20 mg/dL   Creatinine, Ser 5.32 0.44 - 1.00 mg/dL   Calcium 9.0 8.9 - 99.2 mg/dL   Total Protein 8.0 6.5 - 8.1 g/dL   Albumin 4.0 3.5 - 5.0 g/dL   AST 20 15 - 41 U/L   ALT 27 0 - 44 U/L   Alkaline Phosphatase 111 38 - 126 U/L   Total Bilirubin 0.5 0.3 - 1.2 mg/dL   GFR, Estimated >42 >68 mL/min    Comment: (NOTE) Calculated using the CKD-EPI Creatinine Equation (2021)    Anion gap 9 5 - 15    Comment: Performed at [facility], [address] [place], [place] [address]  CBC with Differential     Status: Abnormal   Collection Time: 06/20/20  4:34 PM  Result Value Ref Range   WBC 11.4 (H) 4.0 - 10.5 K/uL   RBC 5.02 3.87 - 5.11 MIL/uL   Hemoglobin 12.3 12.0 - 15.0 g/dL   HCT 22.2 97.9 - 89.2 %   MCV 80.7 80.0 - 100.0 fL   MCH 24.5 (L) 26.0 - 34.0 pg   MCHC 30.4 30.0 - 36.0 g/dL   RDW 11.9 41.7 - 40.8 %   Platelets 319 150 - 400 K/uL   nRBC 0.0 0.0 - 0.2 %   Neutrophils  Relative % 79 %   Neutro Abs 8.8 (H) 1.7 - 7.7 K/uL   Lymphocytes Relative 16 %   Lymphs Abs 1.9 0.7 - 4.0 K/uL   Monocytes Relative 5 %   Monocytes Absolute 0.6 0.1 - 1.0 K/uL   Eosinophils Relative 0 %   Eosinophils Absolute 0.0 0.0 - 0.5 K/uL   Basophils Relative 0 %   Basophils Absolute 0.0 0.0 - 0.1 K/uL   Immature Granulocytes 0 %   Abs Immature  Granulocytes 0.04 0.00 - 0.07 K/uL    Comment: Performed at [facility], [address] [place], [place] [address]  Lipase, blood     Status: None   Collection Time: 06/20/20  4:34 PM  Result Value Ref Range   Lipase 27 11 - 51 U/L    Comment: Performed at [facility], [address] [place], [place] [address]  Urinalysis, Routine w reflex microscopic     Status: None   Collection Time: 06/20/20  4:34 PM  Result Value Ref Range   Color, Urine YELLOW YELLOW   APPearance CLEAR CLEAR   Specific Gravity, Urine 1.025 1.005 - 1.030   pH 6.0 5.0 - 8.0   Glucose, UA NEGATIVE NEGATIVE mg/dL   Hgb urine dipstick NEGATIVE NEGATIVE   Bilirubin Urine NEGATIVE NEGATIVE   Ketones, ur NEGATIVE NEGATIVE mg/dL   Protein, ur NEGATIVE NEGATIVE mg/dL   Nitrite NEGATIVE NEGATIVE   Leukocytes,Ua NEGATIVE NEGATIVE    Comment: Microscopic not done on urines with negative protein, blood, leukocytes, nitrite, or glucose < 500 mg/dL. Performed at [facility], [address] [place], [place] [address]   Pregnancy, urine     Status: None   Collection Time: 06/20/20  4:34 PM  Result Value Ref Range   Preg Test, Ur NEGATIVE NEGATIVE    Comment:        THE SENSITIVITY OF THIS METHODOLOGY IS >20 mIU/mL. Performed at [facility], [address] [place], [place] [address]   Wet prep, genital     Status: Abnormal   Collection Time: 06/20/20  4:49 PM  Result Value Ref Range   Yeast Wet Prep HPF POC NONE SEEN NONE SEEN   Trich, Wet Prep NONE SEEN NONE SEEN   Clue Cells Wet Prep HPF POC NONE SEEN NONE SEEN   WBC, Wet Prep HPF POC MANY (A) NONE SEEN   Sperm NONE SEEN     Comment: Performed at [facility], [address] [place], [place] [address]    CT Abdomen Pelvis W Contrast  Result Date: 06/20/2020 CLINICAL DATA:  Right lower quadrant pain EXAM: CT ABDOMEN AND PELVIS WITH CONTRAST TECHNIQUE: Multidetector CT imaging of the abdomen and pelvis  was performed using the standard protocol following bolus administration of intravenous contrast. CONTRAST:  OMNIPAQUE IOHEXOL 300 MG/ML  SOLN COMPARISON:  None. FINDINGS: Lower chest: No acute abnormality. Hepatobiliary: No focal liver abnormality is seen. No gallstones, gallbladder wall thickening, or biliary dilatation. Pancreas: Unremarkable. No pancreatic ductal dilatation or surrounding inflammatory changes. Spleen: Normal in size without focal abnormality. Adrenals/Urinary Tract: Normal appearance of the adrenal glands. No kidney mass or hydronephrosis. Urinary bladder is normal. Stomach/Bowel: The stomach is unremarkable. The appendix is identified within the right iliac fossa. The appendix is thickened and inflamed measuring 1.3 cm in maximum thickness. Peri appendiceal soft tissue stranding compatible with inflammation. Small volume of free fluid is noted within right posterior  pelvis. Vascular/Lymphatic: No significant vascular findings are present. No enlarged abdominal or pelvic lymph nodes. Reproductive: Uterus and bilateral adnexa are unremarkable. IUD is identified within the uterus. The arms of the IUD are perpendicular to the endometrium and appear to be situated within the anterior and posterior wall of the uterus. Other: No loculated fluid collections. Musculoskeletal: No acute or significant osseous findings. IMPRESSION: 1. Examination is positive for acute appendicitis. No evidence for perforation or abscess. 2. IUD is identified within the uterus. Arms of the IUD are perpendicular endometrial cavity and appear to be within the anterior and posterior wall the uterus. Advise follow-up with patient's OBGYN provider. Electronically Signed   By: Signa Kell M.D.   On: 06/20/2020 18:54     A/P: Bonnie Booth is an 20 y.o. female with acute appendicitis.  Admit to floor.  Start IV abx.  NPO, IVF's.   Plan for lap appendectomy.     Vanita Panda, MD [facility] Use [email or web address] to contact on call provider

## 2020-06-20 NOTE — ED Notes (Signed)
Patient is breastfeeding her baby at this time. Attempting to obtain new VS but baby is squirming and pulling at cuff

## 2020-06-20 NOTE — ED Triage Notes (Signed)
Per [facility] pt c/o lower abd/left flank pain, nausea x 3 days-denies fever/chills/vaginal discharge and urinary sx-to ED WR via w/c

## 2020-06-20 NOTE — ED Notes (Signed)
[facility] at bedside for transport. 

## 2020-06-20 NOTE — ED Provider Notes (Signed)
[facility] EMERGENCY DEPARTMENT Provider Note   CSN: [id number] Arrival date & time: 06/20/20  1536     History Chief Complaint  Patient presents with  . Abdominal Pain    Bonnie Booth is a 20 y.o. female with past medical history of rheumatoid arthritis, anemia, hypertension, who presents today for evaluation of right-sided lower abdominal pain.  She reports that about 3 days ago her pain started periumbilically.  She has had nausea and increased bowel movements and notes one was diarrhea.  She denies any fevers.  She had COVID about a year ago.  She is not vaccinated.  She denies any abnormal vaginal discharge or bleeding.  She states that the pain is made worse with movement, made better with being still.  She has never had any abdominal surgeries before and still has her appendix.  HPI     Past Medical History:  Diagnosis Date  . Anemia   . Hypertension   . Rheumatoid arthritis [facility])     Patient Active Problem List   Diagnosis Date Noted  . IUD (intrauterine device) in place 05/26/2019  . Perineal laceration, second degree 05/20/2019  . Normal spontaneous vaginal delivery 05/20/2019  . Labor and delivery, indication for care 05/19/2019  . Preeclampsia 05/19/2019  . Gestational diabetes 03/16/2019  . Alpha thalassemia silent carrier 12/02/2018  . Supervision of other normal pregnancy, antepartum 11/04/2018    Past Surgical History:  Procedure Laterality Date  . NO PAST SURGERIES       OB History    Gravida  3   Para  1   Term  1   Preterm      AB  2   Living  1     SAB  2   IAB      Ectopic      Multiple  0   Live Births  1           No family history on file.  Social History   Tobacco Use  . Smoking status: Never Smoker  . Smokeless tobacco: Never Used  Vaping Use  . Vaping Use: Never used  Substance Use Topics  . Alcohol use: No  . Drug use: Not Currently    Home Medications Prior to Admission medications    Medication Sig Start Date End Date Taking? Authorizing Provider  enalapril (VASOTEC) 5 MG tablet Take 1 tablet (5 mg total) by mouth daily. Patient not taking: Reported on 03/05/2020 05/21/19 03/05/20  Shirlean Mylar, MD  ferrous sulfate 325 (65 FE) MG tablet Take 1 tablet (325 mg total) by mouth every other day. Patient not taking: Reported on 03/05/2020 05/22/19 03/05/20  Shirlean Mylar, MD    Allergies    Apple, Bee venom, Cinnamon, Mango flavor [flavoring agent], Pineapple, Prunus persica, Tomato, and Peach flavor  Review of Systems   Review of Systems  Constitutional: Negative for chills and fever.  Eyes: Negative for visual disturbance.  Respiratory: Negative for cough and shortness of breath.   Cardiovascular: Negative for chest pain.  Gastrointestinal: Positive for abdominal pain, diarrhea and nausea. Negative for vomiting.  Genitourinary: Negative for dysuria, frequency and urgency.  Musculoskeletal: Negative for back pain and myalgias.  Skin: Negative for color change and rash.  Neurological: Negative for weakness and light-headedness.  Psychiatric/Behavioral: Negative for confusion.  All other systems reviewed and are negative.   Physical Exam Updated Vital Signs BP (!) 124/104 (BP Location: Left Arm)   Pulse 94   Temp 98.9  F (37.2 C) (Oral)   Resp 17   Ht 5\' 7"  (1.702 m)   Wt 81.6 kg   SpO2 99%   BMI 28.19 kg/m   Physical Exam Vitals and nursing note reviewed. Exam conducted with a chaperone present (Female RN).  Constitutional:      General: She is not in acute distress.    Appearance: She is not diaphoretic.  HENT:     Head: Normocephalic and atraumatic.  Eyes:     General: No scleral icterus.       Right eye: No discharge.        Left eye: No discharge.     Conjunctiva/sclera: Conjunctivae normal.  Cardiovascular:     Rate and Rhythm: Normal rate and regular rhythm.  Pulmonary:     Effort: Pulmonary effort is normal. No respiratory distress.      Breath sounds: No stridor.  Abdominal:     General: Bowel sounds are normal. There is no distension.     Palpations: Abdomen is soft.     Tenderness: There is abdominal tenderness in the right lower quadrant, periumbilical area and suprapubic area.     Hernia: No hernia is present.  Genitourinary:    Comments: Normal external female genitalia.  There is a scant amount of discharge in the vaginal canal, cervical os is closed with IUD strings present.  No cervical motion tenderness, bilateral adnexal tenderness right greater than left. Musculoskeletal:        General: No deformity.     Cervical back: Normal range of motion.  Skin:    General: Skin is warm and dry.  Neurological:     Mental Status: She is alert.     Motor: No abnormal muscle tone.  Psychiatric:        Behavior: Behavior normal.     ED Results / Procedures / Treatments   Labs (all labs ordered are listed, but only abnormal results are displayed) Labs Reviewed  WET PREP, GENITAL - Abnormal; Notable for the following components:      Result Value   WBC, Wet Prep HPF POC MANY (*)    All other components within normal limits  COMPREHENSIVE METABOLIC PANEL - Abnormal; Notable for the following components:   Glucose, Bld 118 (*)    All other components within normal limits  CBC WITH DIFFERENTIAL/PLATELET - Abnormal; Notable for the following components:   WBC 11.4 (*)    MCH 24.5 (*)    Neutro Abs 8.8 (*)    All other components within normal limits  RESP PANEL BY RT-PCR (FLU A&B, COVID) ARPGX2  LIPASE, BLOOD  URINALYSIS, ROUTINE W REFLEX MICROSCOPIC  PREGNANCY, URINE  GC/CHLAMYDIA PROBE AMP ([place]) NOT AT [facility]    EKG None  Radiology CT Abdomen Pelvis W Contrast  Result Date: 06/20/2020 CLINICAL DATA:  Right lower quadrant pain EXAM: CT ABDOMEN AND PELVIS WITH CONTRAST TECHNIQUE: Multidetector CT imaging of the abdomen and pelvis was performed using the standard protocol following bolus administration of  intravenous contrast. CONTRAST:  06/22/2020 OMNIPAQUE IOHEXOL 300 MG/ML  SOLN COMPARISON:  None. FINDINGS: Lower chest: No acute abnormality. Hepatobiliary: No focal liver abnormality is seen. No gallstones, gallbladder wall thickening, or biliary dilatation. Pancreas: Unremarkable. No pancreatic ductal dilatation or surrounding inflammatory changes. Spleen: Normal in size without focal abnormality. Adrenals/Urinary Tract: Normal appearance of the adrenal glands. No kidney mass or hydronephrosis. Urinary bladder is normal. Stomach/Bowel: The stomach is unremarkable. The appendix is identified within the right iliac fossa. The  appendix is thickened and inflamed measuring 1.3 cm in maximum thickness. Peri appendiceal soft tissue stranding compatible with inflammation. Small volume of free fluid is noted within right posterior pelvis. Vascular/Lymphatic: No significant vascular findings are present. No enlarged abdominal or pelvic lymph nodes. Reproductive: Uterus and bilateral adnexa are unremarkable. IUD is identified within the uterus. The arms of the IUD are perpendicular to the endometrium and appear to be situated within the anterior and posterior wall of the uterus. Other: No loculated fluid collections. Musculoskeletal: No acute or significant osseous findings. IMPRESSION: 1. Examination is positive for acute appendicitis. No evidence for perforation or abscess. 2. IUD is identified within the uterus. Arms of the IUD are perpendicular endometrial cavity and appear to be within the anterior and posterior wall the uterus. Advise follow-up with patient's OBGYN provider. Electronically Signed   By: Signa Kell M.D.   On: 06/20/2020 18:54    Procedures Procedures   Medications Ordered in ED Medications  morphine 4 MG/ML injection 4 mg (4 mg Intravenous Not Given 06/20/20 1739)  ondansetron (ZOFRAN) injection 4 mg (4 mg Intravenous Not Given 06/20/20 1739)  cefTRIAXone (ROCEPHIN) 2 g in sodium chloride 0.9 %  100 mL IVPB (has no administration in time range)    And  metroNIDAZOLE (FLAGYL) IVPB 500 mg (has no administration in time range)  sodium chloride 0.9 % bolus 1,000 mL (0 mLs Intravenous Stopped 06/20/20 1823)  iohexol (OMNIPAQUE) 300 MG/ML solution 100 mL (100 mLs Intravenous Contrast Given 06/20/20 1800)    ED Course  I have reviewed the triage vital signs and the nursing notes.  Pertinent labs & imaging results that were available during my care of the patient were reviewed by me and considered in my medical decision making (see chart for details).  Clinical Course as of 06/20/20 1912  Wed Jun 20, 2020  1900 Spoke with Dr. Maisie Fus of general surgery, she states that she will put in orders and patient can be admitted to the floor at [facility]. [EH]    Clinical Course User Index [EH] Norman Clay   MDM Rules/Calculators/A&P                         Patient is a 20 year old woman who presents today for evaluation of right-sided lower abdominal pain for 3 days.  Her pain started periumbilically and is now in the right lower quadrant. On my exam she has tenderness primarily in the right lower quadrant.  Pelvic exam shows bilateral adnexal tenderness without obvious fullness or cervical motion tenderness. Labs are obtained and reviewed, white count is elevated at 11.4.  She is not anemic.  UA without evidence of infection.  CMP is unremarkable.  Pregnancy test is negative.  Wet prep shows many white blood cells however is otherwise negative.  Lipase is not elevated.  Covid and GC testing is sent.  CT abdomen pelvis shows concerns for acute appendicitis without evidence of perforation or abscess.  Additionally it shows that the IUD are perpendicular to the endometrial cavity and appear to be within the anterior and posterior walls of the uterus with recommendation for OB/GYN follow-up.  I spoke with Dr. Maisie Fus of general surgery who will admit patient to the floor at [facility].   Patient is aware of her results.  IV antibiotics are ordered.  Note: Portions of this report may have been transcribed using voice recognition software. Every effort was made to ensure accuracy; however, inadvertent computerized transcription  errors may be present   Final Clinical Impression(s) / ED Diagnoses Final diagnoses:  Acute appendicitis with localized peritonitis, without perforation, abscess, or gangrene    Rx / DC Orders ED Discharge Orders    None       Norman Clay 06/20/20 Gorden Harms, MD 06/21/20 [phone]

## 2020-06-20 NOTE — ED Notes (Signed)
Patient defers pain medication at this time due to the fact that she is still breastfeeding her child and does not want to risk her child.

## 2020-06-21 ENCOUNTER — Encounter (HOSPITAL_COMMUNITY)
Admission: EM | Disposition: A | Discharge status: Home / Self Care | Payer: Self-pay | Source: Home / Self Care | Attending: Emergency Medicine

## 2020-06-21 ENCOUNTER — Observation Stay (HOSPITAL_COMMUNITY): Payer: Medicaid Other | Admitting: Anesthesiology

## 2020-06-21 ENCOUNTER — Other Ambulatory Visit: Payer: Self-pay

## 2020-06-21 ENCOUNTER — Encounter (HOSPITAL_COMMUNITY): Payer: Self-pay

## 2020-06-21 DIAGNOSIS — Z20822 Contact with and (suspected) exposure to covid-19: Secondary | ICD-10-CM | POA: Diagnosis not present

## 2020-06-21 DIAGNOSIS — I10 Essential (primary) hypertension: Secondary | ICD-10-CM | POA: Diagnosis not present

## 2020-06-21 DIAGNOSIS — K3580 Unspecified acute appendicitis: Secondary | ICD-10-CM | POA: Diagnosis not present

## 2020-06-21 DIAGNOSIS — Z79899 Other long term (current) drug therapy: Secondary | ICD-10-CM | POA: Diagnosis not present

## 2020-06-21 HISTORY — PX: LAPAROSCOPIC APPENDECTOMY: SHX408

## 2020-06-21 LAB — GC/CHLAMYDIA PROBE AMP (~~LOC~~) NOT AT ARMC
Chlamydia: NEGATIVE
Comment: NEGATIVE
Comment: NORMAL
Neisseria Gonorrhea: NEGATIVE

## 2020-06-21 SURGERY — APPENDECTOMY, LAPAROSCOPIC
Anesthesia: General | Site: Abdomen

## 2020-06-21 MED ORDER — ACETAMINOPHEN 500 MG PO TABS
1000.0000 mg | ORAL_TABLET | Freq: Four times a day (QID) | ORAL | 0 refills | Status: AC | PRN
Start: 2020-06-21 — End: 2021-06-21

## 2020-06-21 MED ORDER — MEPERIDINE HCL 50 MG/ML IJ SOLN: 6.2500 mg | INTRAMUSCULAR | Status: DC | PRN

## 2020-06-21 MED ORDER — SUGAMMADEX SODIUM 200 MG/2ML IV SOLN
INTRAVENOUS | Status: DC | PRN
  Administered 2020-06-21: 200 mg via INTRAVENOUS

## 2020-06-21 MED ORDER — SUGAMMADEX SODIUM 500 MG/5ML IV SOLN
INTRAVENOUS | Status: AC
  Filled 2020-06-21: qty 5

## 2020-06-21 MED ORDER — PROPOFOL 10 MG/ML IV BOLUS
INTRAVENOUS | Status: DC | PRN
  Administered 2020-06-21: 200 mg via INTRAVENOUS

## 2020-06-21 MED ORDER — EPHEDRINE 5 MG/ML INJ
INTRAVENOUS | Status: AC
  Filled 2020-06-21: qty 20

## 2020-06-21 MED ORDER — OXYCODONE HCL 5 MG/5ML PO SOLN: 5.0000 mg | Freq: Once | ORAL | Status: DC | PRN

## 2020-06-21 MED ORDER — CEFAZOLIN SODIUM-DEXTROSE 2-4 GM/100ML-% IV SOLN
INTRAVENOUS | Status: AC
  Filled 2020-06-21: qty 100

## 2020-06-21 MED ORDER — MUPIROCIN 2 % EX OINT
1.0000 "application " | TOPICAL_OINTMENT | Freq: Two times a day (BID) | CUTANEOUS | Status: DC
  Filled 2020-06-21: qty 22

## 2020-06-21 MED ORDER — LIDOCAINE HCL (PF) 2 % IJ SOLN
INTRAMUSCULAR | Status: AC
  Filled 2020-06-21: qty 15

## 2020-06-21 MED ORDER — LIDOCAINE 2% (20 MG/ML) 5 ML SYRINGE
INTRAMUSCULAR | Status: DC | PRN
  Administered 2020-06-21: 50 mg via INTRAVENOUS

## 2020-06-21 MED ORDER — BUPIVACAINE-EPINEPHRINE (PF) 0.25% -1:200000 IJ SOLN
INTRAMUSCULAR | Status: DC | PRN
  Administered 2020-06-21: 30 mL

## 2020-06-21 MED ORDER — PHENYLEPHRINE 40 MCG/ML (10ML) SYRINGE FOR IV PUSH (FOR BLOOD PRESSURE SUPPORT)
PREFILLED_SYRINGE | INTRAVENOUS | Status: AC
  Filled 2020-06-21: qty 20

## 2020-06-21 MED ORDER — KETOROLAC TROMETHAMINE 30 MG/ML IJ SOLN: 30.0000 mg | Freq: Once | INTRAMUSCULAR | Status: DC | PRN

## 2020-06-21 MED ORDER — FENTANYL CITRATE (PF) 100 MCG/2ML IJ SOLN
INTRAMUSCULAR | Status: DC | PRN
  Administered 2020-06-21: 100 ug via INTRAVENOUS

## 2020-06-21 MED ORDER — LACTATED RINGERS IR SOLN
Status: DC | PRN
  Administered 2020-06-21: 1000 mL

## 2020-06-21 MED ORDER — PROMETHAZINE HCL 25 MG/ML IJ SOLN
6.2500 mg | INTRAMUSCULAR | Status: AC | PRN
  Administered 2020-06-21: 6.25 mg via INTRAVENOUS

## 2020-06-21 MED ORDER — HYDROMORPHONE HCL 1 MG/ML IJ SOLN
INTRAMUSCULAR | Status: AC
  Filled 2020-06-21: qty 1

## 2020-06-21 MED ORDER — LACTATED RINGERS IV SOLN: INTRAVENOUS | Status: DC | PRN

## 2020-06-21 MED ORDER — PROPOFOL 10 MG/ML IV BOLUS
INTRAVENOUS | Status: AC
  Filled 2020-06-21: qty 20

## 2020-06-21 MED ORDER — ONDANSETRON HCL 4 MG/2ML IJ SOLN
INTRAMUSCULAR | Status: AC
  Filled 2020-06-21: qty 2

## 2020-06-21 MED ORDER — MIDAZOLAM HCL 2 MG/2ML IJ SOLN
INTRAMUSCULAR | Status: AC
  Filled 2020-06-21: qty 2

## 2020-06-21 MED ORDER — ACETAMINOPHEN 500 MG PO TABS
1000.0000 mg | ORAL_TABLET | Freq: Once | ORAL | Status: AC
  Administered 2020-06-21: 12:00:00 1000 mg via ORAL
  Filled 2020-06-21: qty 2

## 2020-06-21 MED ORDER — HYDROMORPHONE HCL 1 MG/ML IJ SOLN
1.0000 mg | INTRAMUSCULAR | Status: DC | PRN
  Administered 2020-06-21: 1 mg via INTRAVENOUS
  Filled 2020-06-21: qty 1

## 2020-06-21 MED ORDER — OXYCODONE HCL 5 MG PO TABS
5.0000 mg | ORAL_TABLET | Freq: Once | ORAL | Status: DC | PRN
Start: 2020-06-21 — End: 2020-06-21

## 2020-06-21 MED ORDER — FENTANYL CITRATE (PF) 250 MCG/5ML IJ SOLN
INTRAMUSCULAR | Status: AC
  Filled 2020-06-21: qty 5

## 2020-06-21 MED ORDER — MIDAZOLAM HCL 5 MG/5ML IJ SOLN
INTRAMUSCULAR | Status: DC | PRN
  Administered 2020-06-21: 2 mg via INTRAVENOUS

## 2020-06-21 MED ORDER — OXYCODONE-ACETAMINOPHEN 5-325 MG PO TABS: 1.0000 | ORAL_TABLET | ORAL | 0 refills | Status: AC | PRN

## 2020-06-21 MED ORDER — IBUPROFEN 800 MG PO TABS: 800.0000 mg | ORAL_TABLET | Freq: Three times a day (TID) | ORAL | 0 refills | Status: AC

## 2020-06-21 MED ORDER — DEXAMETHASONE SODIUM PHOSPHATE 10 MG/ML IJ SOLN
INTRAMUSCULAR | Status: AC
  Filled 2020-06-21: qty 1

## 2020-06-21 MED ORDER — SCOPOLAMINE 1 MG/3DAYS TD PT72
1.0000 | MEDICATED_PATCH | TRANSDERMAL | Status: DC
  Administered 2020-06-21: 1.5 mg via TRANSDERMAL
  Filled 2020-06-21 (×2): qty 1

## 2020-06-21 MED ORDER — PHENYLEPHRINE 40 MCG/ML (10ML) SYRINGE FOR IV PUSH (FOR BLOOD PRESSURE SUPPORT)
PREFILLED_SYRINGE | INTRAVENOUS | Status: AC
  Filled 2020-06-21: qty 10

## 2020-06-21 MED ORDER — ROCURONIUM BROMIDE 10 MG/ML (PF) SYRINGE
PREFILLED_SYRINGE | INTRAVENOUS | Status: DC | PRN
  Administered 2020-06-21: 70 mg via INTRAVENOUS

## 2020-06-21 MED ORDER — ROCURONIUM BROMIDE 10 MG/ML (PF) SYRINGE
PREFILLED_SYRINGE | INTRAVENOUS | Status: AC
  Filled 2020-06-21: qty 10

## 2020-06-21 MED ORDER — HYDROMORPHONE HCL 1 MG/ML IJ SOLN
0.2500 mg | INTRAMUSCULAR | Status: DC | PRN
  Administered 2020-06-21 (×2): 0.5 mg via INTRAVENOUS

## 2020-06-21 MED ORDER — SUCCINYLCHOLINE CHLORIDE 200 MG/10ML IV SOSY
PREFILLED_SYRINGE | INTRAVENOUS | Status: AC
  Filled 2020-06-21: qty 10

## 2020-06-21 MED ORDER — CEFAZOLIN SODIUM-DEXTROSE 2-3 GM-%(50ML) IV SOLR
INTRAVENOUS | Status: DC | PRN
  Administered 2020-06-21: 2 g via INTRAVENOUS

## 2020-06-21 MED ORDER — PROMETHAZINE HCL 25 MG/ML IJ SOLN
INTRAMUSCULAR | Status: AC
  Administered 2020-06-21: 6.25 mg via INTRAVENOUS
  Filled 2020-06-21: qty 1

## 2020-06-21 MED ORDER — OXYCODONE-ACETAMINOPHEN 5-325 MG PO TABS
1.0000 | ORAL_TABLET | ORAL | Status: DC | PRN
  Administered 2020-06-22: 09:00:00 1 via ORAL
  Filled 2020-06-21: qty 1

## 2020-06-21 MED ORDER — DEXAMETHASONE SODIUM PHOSPHATE 10 MG/ML IJ SOLN
INTRAMUSCULAR | Status: DC | PRN
  Administered 2020-06-21: 10 mg via INTRAVENOUS

## 2020-06-21 SURGICAL SUPPLY — 45 items
ADH SKN CLS APL DERMABOND .7 (GAUZE/BANDAGES/DRESSINGS) ×1
APL PRP STRL LF DISP 70% ISPRP (MISCELLANEOUS) ×1
APPLIER CLIP ROT 10 11.4 M/L (STAPLE)
APR CLP MED LRG 11.4X10 (STAPLE)
BAG SPEC RTRVL 10 TROC 200 (ENDOMECHANICALS)
BAG SPEC RTRVL LRG 6X4 10 (ENDOMECHANICALS) ×1
CABLE HIGH FREQUENCY MONO STRZ (ELECTRODE) IMPLANT
CHLORAPREP W/TINT 26 (MISCELLANEOUS) ×2 IMPLANT
CLIP APPLIE ROT 10 11.4 M/L (STAPLE) IMPLANT
COVER SURGICAL LIGHT HANDLE (MISCELLANEOUS) ×2 IMPLANT
COVER WAND RF STERILE (DRAPES) IMPLANT
CUTTER FLEX LINEAR 45M (STAPLE) ×2 IMPLANT
DECANTER SPIKE VIAL GLASS SM (MISCELLANEOUS) IMPLANT
DERMABOND ADVANCED (GAUZE/BANDAGES/DRESSINGS) ×1
DERMABOND ADVANCED .7 DNX12 (GAUZE/BANDAGES/DRESSINGS) ×1 IMPLANT
DRAPE LAPAROSCOPIC ABDOMINAL (DRAPES) IMPLANT
ELECT REM PT RETURN 15FT ADLT (MISCELLANEOUS) ×2 IMPLANT
ENDOLOOP SUT PDS II  0 18 (SUTURE)
ENDOLOOP SUT PDS II 0 18 (SUTURE) IMPLANT
GLOVE BIOGEL M 8.0 STRL (GLOVE) ×2 IMPLANT
GOWN STRL REUS W/TWL XL LVL3 (GOWN DISPOSABLE) ×2 IMPLANT
KIT BASIN OR (CUSTOM PROCEDURE TRAY) ×2 IMPLANT
KIT TURNOVER KIT A (KITS) ×2 IMPLANT
PAD POSITIONING PINK XL (MISCELLANEOUS) ×2 IMPLANT
PENCIL SMOKE EVACUATOR (MISCELLANEOUS) IMPLANT
POUCH RETRIEVAL ECOSAC 10 (ENDOMECHANICALS) IMPLANT
POUCH RETRIEVAL ECOSAC 10MM (ENDOMECHANICALS)
POUCH SPECIMEN RETRIEVAL 10MM (ENDOMECHANICALS) ×2 IMPLANT
RELOAD 45 VASCULAR/THIN (ENDOMECHANICALS) ×2 IMPLANT
RELOAD STAPLE TA45 3.5 REG BLU (ENDOMECHANICALS) ×2 IMPLANT
SCISSORS LAP 5X45 EPIX DISP (ENDOMECHANICALS) ×2 IMPLANT
SET IRRIG TUBING LAPAROSCOPIC (IRRIGATION / IRRIGATOR) ×2 IMPLANT
SET TUBE SMOKE EVAC HIGH FLOW (TUBING) ×2 IMPLANT
SHEARS HARMONIC ACE PLUS 45CM (MISCELLANEOUS) ×2 IMPLANT
SLEEVE XCEL OPT CAN 5 100 (ENDOMECHANICALS) ×2 IMPLANT
STAPLER VISISTAT 35W (STAPLE) IMPLANT
SUT MNCRL AB 4-0 PS2 18 (SUTURE) ×2 IMPLANT
TOWEL OR 17X26 10 PK STRL BLUE (TOWEL DISPOSABLE) ×2 IMPLANT
TRAY FOLEY MTR SLVR 14FR STAT (SET/KITS/TRAYS/PACK) IMPLANT
TRAY FOLEY MTR SLVR 16FR STAT (SET/KITS/TRAYS/PACK) IMPLANT
TRAY LAPAROSCOPIC (CUSTOM PROCEDURE TRAY) ×2 IMPLANT
TROCAR BLADELESS OPT 5 100 (ENDOMECHANICALS) ×2 IMPLANT
TROCAR XCEL 12X100 BLDLESS (ENDOMECHANICALS) ×2 IMPLANT
TROCAR XCEL BLUNT TIP 100MML (ENDOMECHANICALS) IMPLANT
TROCAR XCEL NON-BLD 11X100MML (ENDOMECHANICALS) IMPLANT

## 2020-06-21 NOTE — Progress Notes (Signed)
Day of Surgery    CC: Abdominal pain  Subjective: Feels fine this AM.  Not having a good deal of pain.  She was concerned with the position of her IUD she says it appears different to her.  Objective: Vital signs in last 24 hours: Temp:  [98.7 F (37.1 C)-99.7 F (37.6 C)] 98.7 F (37.1 C) (02/24 0441) Pulse Rate:  [86-105] 86 (02/24 0441) Resp:  [16-18] 17 (02/24 0441) BP: (99-169)/(61-137) 99/66 (02/24 0441) SpO2:  [98 %-100 %] 100 % (02/24 0441) Weight:  [81.6 kg] 81.6 kg (02/23 1549)  N.p.o.  1552 IV Urine 100 recorded No BM T-max 99.3 vital signs are stable CMP is normal except for glucose of 118 WBC 11.4 Wet prep many WBC negative for yeast trichomonas clue cells CT scan  Acute appendicitis with no evidence for perforation/abscess IUD with arms of the IUD perpendicular to the endometrium cavity appear within the anterior/posterior wall of the uterus  Intake/Output from previous day: 02/23 0701 - 02/24 0700 In: 1552.5 [I.V.:352.5; IV Piggyback:1200] Out: 100 [Urine:100] Intake/Output this shift: No intake/output data recorded.  General appearance: alert, cooperative and no distress Resp: clear to auscultation bilaterally GI: Soft, tender right lower quadrant.  Lab Results:  Recent Labs    06/20/20 1634  WBC 11.4*  HGB 12.3  HCT 40.5  PLT 319    BMET Recent Labs    06/20/20 1634  NA 136  K 3.6  CL 101  CO2 26  GLUCOSE 118*  BUN 13  CREATININE 0.71  CALCIUM 9.0   PT/INR No results for input(s): LABPROT, INR in the last 72 hours.  Recent Labs  Lab 06/20/20 1634  AST 20  ALT 27  ALKPHOS 111  BILITOT 0.5  PROT 8.0  ALBUMIN 4.0     Lipase     Component Value Date/Time   LIPASE 27 06/20/2020 1634     Medications: . docusate sodium  100 mg Oral BID  . enoxaparin (LOVENOX) injection  40 mg Subcutaneous Q24H  .  morphine injection  4 mg Intravenous Once  . mupirocin ointment  1 application Nasal BID  . ondansetron (ZOFRAN) IV  4 mg  Intravenous Once    Assessment/Plan Malpositioned IUD  - GYN office/call service called to evaluate (Dr.Ugonna Ada Anyanwu)/ Dr Vergie Living will review     with day team Hx hypertension Hx rheumatoid arthritis -not on medications    Acute appendicitis  FEN:  NPO ID: Rocephin/Metronidazole DVT: Lovenox  Follow up:  [facility]  Plan:  For OR later today.  GYN has been contacted and they will review the CT and discuss plans for her IUD with her later today.    LOS: 0 days    Peighton Mehra 06/21/2020 Please see Amion

## 2020-06-21 NOTE — Discharge Instructions (Signed)
 APPENDECTOMY POST OPERATIVE INSTRUCTIONS  Thinking Clearly  . The anesthesia may cause you to feel different for 1 or 2 days. Do not drive, drink alcohol, or make any big decisions for at least 2 days.  Nutrition . When you wake up, you will be able to drink small amounts of liquid. If you do not feel sick, you can slowly advance your diet to regular foods. . Continue to drink lots of fluids, usually about 8 to 10 glasses per day. . Eat a high-fiber diet so you don't strain during bowel movements. . High-Fiber Foods o Foods high in fiber include beans, bran cereals and whole-grain breads, peas, dried fruit (figs, apricots, and dates), raspberries, blackberries, strawberries, sweet corn, broccoli, baked potatoes with skin, plums, pears, apples, greens, and nuts. Activity . Slowly increase your activity. Be sure to get up and walk every hour or so to prevent blood clots. . No heavy lifting or strenuous activity for 4 weeks following surgery to prevent hernias at your incision sites . It is normal to feel tired. You may need more sleep than usual.  Get your rest but make sure to get up and move around frequently to prevent blood clots and pneumonia.  Work and Return to School . You can go back to work when you feel well enough. Discuss the timing with your surgeon. . You can usually go back to school or work 1 week or less after an operation for an unruptured appendix and up to 2 weeks after a ruptured appendix. . If your work requires heavy lifting or strenuous activity you need to be placed on light duty for 4 weeks following surgery. . You can return to gym class, sports or other physical activities 4 weeks after surgery.  Wound Care . Always wash your hands before and after touching near your incision site. . Do not soak in a bathtub until cleared at your follow up appointment. You may take a shower 24 hours after surgery. . A small amount of drainage from the incision is normal. If the  drainage is thick and yellow or the site is red, you may have an infection, so call your surgeon. . If you have a drain in one of your incisions, it will be taken out in office when the drainage stops. . Steri-Strips will fall off in 7 to 10 days or they will be removed during your first office visit. . If you have dermabond glue covering over the incision, allow the glue to flake off on its own. . Avoid wearing tight or rough clothing. It may rub your incisions and make it harder for them to heal. . Protect the new skin, especially from the sun. The sun can burn and cause darker scarring. . Your scar will heal in about 4 to 6 weeks and will become softer and continue to fade over the next year.  The cosmetic appearance of the incisions will improve over the course of the first year after surgery. . Sensation around your incision will return in a few weeks or months.  Bowel Movements . After intestinal surgery, you may have loose watery stools for several days. If watery diarrhea lasts longer than 3 days, contact your surgeon. . Pain medication (narcotics) can cause constipation. Increase the fiber in your diet with high-fiber foods if you are constipated. You can take an over the counter stool softener like Colace to avoid constipation.  Additional over the counter medications can also be used if Colace isn't   sufficient (for example, Milk of Magnesia or Miralax).  Pain . The amount of pain is different for each person. Some people need only 1 to 3 doses of pain control medication, while others need more. . Take alternating doses of tylenol and ibuprofen around the clock for the first five days following surgery.  This will provide a baseline of pain control and help with inflammation.  Take the narcotic pain medication in addition if needed for severe pain.  Contact Your Surgeon at [phone], if you have: . Pain that will not go away . Pain that gets worse . A fever of more than 101F  (38.3C) . Repeated vomiting . Swelling, redness, bleeding, or bad-smelling drainage from your wound site . Strong abdominal pain . No bowel movement or unable to pass gas for 3 days . Watery diarrhea lasting longer than 3 days  Pain Control . The goal of pain control is to minimize pain, keep you moving and help you heal. Your surgical team will work with you on your pain plan. Most often a combination of therapies and medications are used to control your pain. You may also be given medication (local anesthetic) at the surgical site. This may help control your pain for several days. . Extreme pain puts extra stress on your body at a time when your body needs to focus on healing. Do not wait until your pain has reached a level "10" or is unbearable before telling your doctor or nurse. It is much easier to control pain before it becomes severe. . Following a laparoscopic procedure, pain is sometimes felt in the shoulder. This is due to the gas inserted into your abdomen during the procedure. Moving and walking helps to decrease the gas and the right shoulder pain.  . Use the guide below for ways to manage your post-operative pain. Learn more by going to facs.org/safepaincontrol.  How Intense Is My Pain Common Therapies to Feel Better       I hardly notice my pain, and it does not interfere with my activities.  I notice my pain and it distracts me, but I can still do activities (sitting up, walking, standing).  Non-Medication Therapies  Ice (in a bag, applied over clothing at the surgical site), elevation, rest, meditation, massage, distraction (music, TV, play) walking and mild exercise Splinting the abdomen with pillows +  Non-Opioid Medications Acetaminophen (Tylenol) Non-steroidal anti-inflammatory drugs (NSAIDS) Aspirin, Ibuprofen (Motrin, Advil) Naproxen (Aleve) Take these as needed, when you feel pain. Both acetaminophen and NSAIDs help to decrease pain and swelling  (inflammation).      My pain is hard to ignore and is more noticeable even when I rest.  My pain interferes with my usual activities.  Non-Medication Therapies  +  Non-Opioid medications  Take on a regular schedule (around-the-clock) instead of as needed. (For example, Tylenol every 6 hours at 9:00 am, 3:00 pm, 9:00 pm, 3:00 am and Motrin every 6 hours at 12:00 am, 6:00 am, 12:00 pm, 6:00 pm)         I am focused on my pain, and I am not doing my daily activities.  I am groaning in pain, and I cannot sleep. I am unable to do anything.  My pain is as bad as it could be, and nothing else matters.  Non-Medication Therapies  +  Around-the-Clock Non-Opioid Medications  +  Short-acting opioids  Opioids should be used with other medications to manage severe pain. Opioids block pain and give a feeling of  euphoria (feel high). Addiction, a serious side effect of opioids, is rare with short-term (a few days) use.  Examples of short-acting opioids include: Tramadol (Ultram), Hydrocodone (Norco, Vicodin), Hydromorphone (Dilaudid), Oxycodone (Oxycontin)     The above directions have been adapted from the [employer] Surgical Patient Education Program.  Please refer to the ACS website if needed: StatOfficial.co.za.   Louanna Raw, MD [facility] [address] [place], [place], [place]  [address] ?  [address], [place], [place]   [address] [phone] ? [phone] ? FAX [phone] Web site: [email or web address]

## 2020-06-21 NOTE — Anesthesia Procedure Notes (Addendum)
Procedure Name: Intubation Date/Time: 06/21/2020 2:31 PM Performed by: Gerald Leitz, CRNA Pre-anesthesia Checklist: Patient identified, Patient being monitored, Timeout performed, Emergency Drugs available and Suction available Patient Re-evaluated:Patient Re-evaluated prior to induction Oxygen Delivery Method: Circle system utilized Preoxygenation: Pre-oxygenation with 100% oxygen Induction Type: IV induction Ventilation: Mask ventilation without difficulty Laryngoscope Size: Mac and 3 Grade View: Grade I Tube type: Oral Tube size: 7.0 mm Number of attempts: 1 Placement Confirmation: ETT inserted through vocal cords under direct vision,  positive ETCO2 and breath sounds checked- equal and bilateral Secured at: 21 cm Tube secured with: Tape Dental Injury: Teeth and Oropharynx as per pre-operative assessment

## 2020-06-21 NOTE — Progress Notes (Signed)
Patient updated on surgeon delay. Case before her ran over, they are finishing up now and then have to clean room, informed patient hopefully she will go back by 1400-1415.  Patient voiced understanding. Patient states she is just hungry.

## 2020-06-21 NOTE — Progress Notes (Signed)
Spoke to [employer], Charity fundraiser. Received report for surgery.

## 2020-06-21 NOTE — Op Note (Addendum)
Patient: Bonnie Booth (2000/07/03, [id number])  Date of Surgery: 06/21/2020   Preoperative Diagnosis: Appendicitis   Postoperative Diagnosis: Appendicitis   Surgical Procedure: APPENDECTOMY LAPAROSCOPIC:    Operative Team Members:  Surgeon(s) and Role:    * Jeniece Hannis, Hyman Hopes, MD - Primary   Anesthesiologist: Eilene Ghazi, MD CRNA: Basilio Cairo, CRNA   Anesthesia: General   Fluids:  Total I/O In: 1150 [I.V.:1000; IV Piggyback:150] Out: 90 [Urine:75; Blood:15]  Complications: * No complications entered in OR log *  Drains:  none   Specimen:  ID Type Source Tests Collected by Time Destination  1 : appendix Tissue PATH Appendix SURGICAL PATHOLOGY Katey Barrie, Hyman Hopes, MD 06/21/2020 1506      Disposition:  PACU - hemodynamically stable.  Plan of Care: Discharge to home after PACU    Indications for Procedure: Bonnie Booth is a 20 y.o. female who presented with abdominal pain.  History, physical and imaging was concerning for appendicitis, so laparoscopic appendectomy was recommended for the patient.  The procedure itself, as well as the risks, benefits and alternatives were discussed with the patient.  Risks discussed included but were not limited to the risk of bleeding, infection, damage to nearby structures, need to convert to open procedure, incisional hernia, and the need for additional procedures or surgeries.  With this discussion complete and all questions answered the patient granted consent to proceed.  Findings: Inflamed appendix  Description of Procedure:   On the date stated above, the patient was taken to the operating room suite and placed in supine positioning with the left arm tucked.  Sequential compression devices were placed on the lower extremities to prevent blood clots.  General endotracheal anesthesia was induced.  A foley catheter was placed to decompress the bladder, it was removed at the end of the case.   Preoperative antibiotics  (cefazolin and metronidazole) were given within 30 minutes of incision.  The patient's abdomen was prepped and draped in the usual sterile fashion.  A time-out was completed verifying the correct patient, procedure, positioning and equipment needed for the case.  We began by anesthetizing the skin with local anesthetic and then making a 5 mm incision just below the umbilicus.  We dissected through the subcutaneous tissues to the fascia.  The fascia was grasped and elevated using a Kocher clamp.  A Veress needle was inserted into the abdomen and the abdomen was insufflated to 15 mmHg.  A 5 mm trocar was inserted in this position under optical guidance and then the abdomen was inspected.  There was no trauma to the underlying viscera with initial trocar placement.  Any abnormal findings, other than inflammation in the right lower quadrant, are listed above in the findings section.  Two additional trocars were placed, one 5 mm trocar suprapubically and one 12 mm trocar in the left lower quadrant.  These were placed under direct vision without any trauma to the underlying viscera.    The patient was then placed in head down, left side down positioning.  The appendix was identified and dissected free from its attachments to the abdominal wall, small intestine and cecum.  A window was created in the mesoappendix using blunt dissection.  We used one 45 mm blue load of the endoscopic linear stapler to divide the base of the appendix from the cecum.  Then one 45 mm white load of the endoscopic linear stapler was then used to divide the mesoappendix.  The appendix was removed through the 12 mm  port site in the left lower quadrant.  A suction irrigator was used to clean the operative field. The staple line was well formed.  There was some bleeding from the staple line so a 10 mm clip applier was used to obtain hemostasis.  There was good hemostasis at the end of the case.  At this point we directed our attention to  closure.  The patient was moved back to a level position.  The 12 mm trocar site was closed at the fascial level using an 0-vicryl on a fascial suture passer.  The abdomen was desufflated.  The skin was closed using 4-0 Monocryl and dermabond.  All sponge and needle counts were correct at the end of the case.    Ivar Drape, MD General, Bariatric, & Minimally Invasive Surgery [facility], [place]

## 2020-06-21 NOTE — Progress Notes (Signed)
Patient returned from surgery with discharge orders, at this time does not feel that she is able to go home safely, paged Dr. Christain Sacramento, informed, and he stated that if she doesn't feel like going home in a little while to d/c the discharge

## 2020-06-21 NOTE — Anesthesia Preprocedure Evaluation (Addendum)
Anesthesia Evaluation  Patient identified by MRN, date of birth, ID band Patient awake    Reviewed: Allergy & Precautions, NPO status , Patient's Chart, lab work & pertinent test results  Airway Mallampati: I  TM Distance: >3 FB Neck ROM: Full    Dental no notable dental hx. (+) Teeth Intact, Dental Advisory Given   Pulmonary neg pulmonary ROS,    Pulmonary exam normal breath sounds clear to auscultation       Cardiovascular negative cardio ROS Normal cardiovascular exam Rhythm:Regular Rate:Normal     Neuro/Psych negative neurological ROS  negative psych ROS   GI/Hepatic negative GI ROS, Neg liver ROS,   Endo/Other  negative endocrine ROS  Renal/GU negative Renal ROS  negative genitourinary   Musculoskeletal  (+) Arthritis  (no meds), Rheumatoid disorders,    Abdominal   Peds  Hematology negative hematology ROS (+)   Anesthesia Other Findings   Reproductive/Obstetrics Urine pregnancy test negative 2/23                            Anesthesia Physical Anesthesia Plan  ASA: I  Anesthesia Plan: General   Post-op Pain Management:    Induction: Intravenous and Rapid sequence  PONV Risk Score and Plan: 4 or greater and Ondansetron, Dexamethasone, Midazolam, Scopolamine patch - Pre-op and Treatment may vary due to age or medical condition  Airway Management Planned: Oral ETT  Additional Equipment: None  Intra-op Plan:   Post-operative Plan: Extubation in OR  Informed Consent: I have reviewed the patients History and Physical, chart, labs and discussed the procedure including the risks, benefits and alternatives for the proposed anesthesia with the patient or authorized representative who has indicated his/her understanding and acceptance.     Dental advisory given  Plan Discussed with: CRNA  Anesthesia Plan Comments:        Anesthesia Quick Evaluation

## 2020-06-21 NOTE — Anesthesia Postprocedure Evaluation (Signed)
Anesthesia Post Note  Patient: ROSENDA GEFFRARD  Procedure(s) Performed: APPENDECTOMY LAPAROSCOPIC (N/A Abdomen)     Patient location during evaluation: PACU Anesthesia Type: General Level of consciousness: awake and alert Pain management: pain level controlled Vital Signs Assessment: post-procedure vital signs reviewed and stable Respiratory status: spontaneous breathing, nonlabored ventilation, respiratory function stable and patient connected to nasal cannula oxygen Cardiovascular status: blood pressure returned to baseline and stable Postop Assessment: no apparent nausea or vomiting Anesthetic complications: no   No complications documented.  Last Vitals:  Vitals:   06/21/20 0938 06/21/20 1145  BP: (!) 116/58 125/60  Pulse: 75 87  Resp: 18 16  Temp:  36.8 C  SpO2: 100% 100%    Last Pain:  Vitals:   06/21/20 1158  TempSrc:   PainSc: 7                  Valeska Haislip S

## 2020-06-21 NOTE — Progress Notes (Signed)
Pt stated she did not feel well enough to be discharged tonight. Per MD orders, discharge orders have been d/c.

## 2020-06-21 NOTE — Transfer of Care (Signed)
Immediate Anesthesia Transfer of Care Note  Patient: Bonnie Booth  Procedure(s) Performed: Procedure(s): APPENDECTOMY LAPAROSCOPIC (N/A)  Patient Location: PACU  Anesthesia Type:General  Level of Consciousness: Alert, Awake, Oriented  Airway & Oxygen Therapy: Patient Spontanous Breathing  Post-op Assessment: Report given to RN  Post vital signs: Reviewed and stable  Last Vitals:  Vitals:   06/21/20 0938 06/21/20 1145  BP: (!) 116/58 125/60  Pulse: 75 87  Resp: 18 16  Temp:  36.8 C  SpO2: 100% 100%    Complications: No apparent anesthesia complications

## 2020-06-22 ENCOUNTER — Encounter (HOSPITAL_COMMUNITY): Payer: Self-pay | Admitting: Surgery

## 2020-06-22 LAB — SURGICAL PATHOLOGY

## 2020-06-22 LAB — NASOPHARYNGEAL CULTURE: Culture: NORMAL

## 2020-06-22 NOTE — Discharge Summary (Signed)
  Patient ID: Bonnie Booth [id number] 20 y.o. [birth year]  06/20/2020  Discharge date and time: 06/22/2020  Admitting Physician: Hyman Hopes Waldemar Siegel  Discharge Physician: Hyman Hopes Uthman Mroczkowski  Admission Diagnoses: Appendicitis [K37] Acute appendicitis [K35.80] Complication of intrauterine device (IUD), unspecified complication, initial encounter (HCC) [T83.9XXA] Acute appendicitis with localized peritonitis, without perforation, abscess, or gangrene [K35.30] Patient Active Problem List   Diagnosis Date Noted  . Appendicitis 06/20/2020  . Acute appendicitis 06/20/2020  . IUD (intrauterine device) in place 05/26/2019  . Perineal laceration, second degree 05/20/2019  . Normal spontaneous vaginal delivery 05/20/2019  . Labor and delivery, indication for care 05/19/2019  . Preeclampsia 05/19/2019  . Gestational diabetes 03/16/2019  . Alpha thalassemia silent carrier 12/02/2018  . Supervision of other normal pregnancy, antepartum 11/04/2018     Discharge Diagnoses: Appendicitis Patient Active Problem List   Diagnosis Date Noted  . Appendicitis 06/20/2020  . Acute appendicitis 06/20/2020  . IUD (intrauterine device) in place 05/26/2019  . Perineal laceration, second degree 05/20/2019  . Normal spontaneous vaginal delivery 05/20/2019  . Labor and delivery, indication for care 05/19/2019  . Preeclampsia 05/19/2019  . Gestational diabetes 03/16/2019  . Alpha thalassemia silent carrier 12/02/2018  . Supervision of other normal pregnancy, antepartum 11/04/2018    Operations: Procedure(s): APPENDECTOMY LAPAROSCOPIC  Admission Condition: good  Discharged Condition: good  Indication for Admission: Appendicitis  Hospital Course: Bonnie Booth underwent laparoscopic appendectomy for appendicitis on 06/21/2020.  She recovered well and was discharged the following day.  Consults: None  Significant Diagnostic Studies: CT scan abdomen 06/21/2020  Treatments: surgery: Lap appy 06/21/20  Dr. Dossie Der  Disposition: Home  Patient Instructions:  Allergies as of 06/22/2020      Reactions   Apple Anaphylaxis, Swelling   Patient's throat swells   Bee Venom Anaphylaxis, Swelling   Patient's throat swells Patient's throat swells   Cinnamon Anaphylaxis, Swelling   Patient's throat swells   Mango Flavor [flavoring Agent] Anaphylaxis, Swelling   Patient's throat swells   Pineapple Anaphylaxis, Swelling   Patient's throat swells   Prunus Persica Anaphylaxis, Swelling, Rash   Tomato Anaphylaxis, Nausea And Vomiting   Peach Flavor       Medication List    TAKE these medications   acetaminophen 500 MG tablet Commonly known as: TYLENOL Take 2 tablets (1,000 mg total) by mouth every 6 (six) hours as needed.   ibuprofen 800 MG tablet Commonly known as: ADVIL Take 1 tablet (800 mg total) by mouth every 8 (eight) hours for 7 days.   levonorgestrel 20 MCG/24HR IUD Commonly known as: MIRENA 1 each by Intrauterine route once. Jan 2021 placed   oxyCODONE-acetaminophen 5-325 MG tablet Commonly known as: Percocet Take 1 tablet by mouth every 4 (four) hours as needed for severe pain.       Activity: no heavy lifting or strenuous activity for 4 weeks. Diet: regular diet Wound Care: keep wound clean and dry  Follow-up:  With Surgery office in 3 weeks.  Signed: Hyman Hopes Bonnie Booth General, Bariatric, & Minimally Invasive Surgery [facility], [place]   06/22/2020, 8:36 AM

## 2020-06-22 NOTE — Progress Notes (Signed)
1 Day Post-Op    CC: Abdominal pain  Subjective: Still tired and sleepy this AM, sore.  Sites all look good.  Objective: Vital signs in last 24 hours: Temp:  [98 F (36.7 C)-98.6 F (37 C)] 98 F (36.7 C) (02/25 0432) Pulse Rate:  [63-112] 63 (02/25 0432) Resp:  [14-25] 16 (02/25 0432) BP: (102-143)/(58-80) 111/66 (02/25 0432) SpO2:  [92 %-100 %] 99 % (02/25 0432) Weight:  [81.6 kg] 81.6 kg (02/24 1159)  Nothing p.o. recorded  1150 IV recorded 3800 urine No BM recorded Afebrile vital signs are stable No labs Intake/Output from previous day: 02/24 0701 - 02/25 0700 In: 1150 [I.V.:1000; IV Piggyback:150] Out: 3815 [Urine:3800; Blood:15] Intake/Output this shift: No intake/output data recorded.  General appearance: alert, cooperative and no distress Resp: clear to auscultation bilaterally GI: Soft, sore, port sites all look fine.  Lab Results:  Recent Labs    06/20/20 1634  WBC 11.4*  HGB 12.3  HCT 40.5  PLT 319    BMET Recent Labs    06/20/20 1634  NA 136  K 3.6  CL 101  CO2 26  GLUCOSE 118*  BUN 13  CREATININE 0.71  CALCIUM 9.0   PT/INR No results for input(s): LABPROT, INR in the last 72 hours.  Recent Labs  Lab 06/20/20 1634  AST 20  ALT 27  ALKPHOS 111  BILITOT 0.5  PROT 8.0  ALBUMIN 4.0     Lipase     Component Value Date/Time   LIPASE 27 06/20/2020 1634     Medications: .  morphine injection  4 mg Intravenous Once  . mupirocin ointment  1 application Nasal BID  . ondansetron (ZOFRAN) IV  4 mg Intravenous Once    Assessment/Plan - GYN office/call service called to evaluate (Dr.Ugonna Ada Anyanwu)/ Dr Vergie Living will review     with day team Hx hypertension Hx rheumatoid arthritis -not on medications    Acute appendicitis Laparoscopic appendectomy 06/21/2020 Dr. Ivar Drape, POD #1  FEN: Regular ID: Rocephin/Metronidazole preop DVT: Lovenox  [facility]  [facility]   Plan: Advance diet, discharge home this AM.   Follow-up with our office in [facility].  She will follow-up with her GYN provider to evaluate her IUD.  LOS: 0 days    Bonnie Booth,Bonnie Booth 06/22/2020 Please see Amion

## 2020-07-09 ENCOUNTER — Ambulatory Visit: Payer: Medicaid Other | Admitting: Obstetrics and Gynecology

## 2020-07-10 ENCOUNTER — Emergency Department (HOSPITAL_COMMUNITY)
Admission: EM | Admit: 2020-07-10 | Discharge: 2020-07-11 | Disposition: A | Discharge status: Home / Self Care | Payer: Medicaid Other | Attending: Emergency Medicine | Admitting: Emergency Medicine

## 2020-07-10 ENCOUNTER — Encounter (HOSPITAL_COMMUNITY): Payer: Self-pay | Admitting: Emergency Medicine

## 2020-07-10 DIAGNOSIS — R109 Unspecified abdominal pain: Secondary | ICD-10-CM | POA: Diagnosis present

## 2020-07-10 DIAGNOSIS — Z79899 Other long term (current) drug therapy: Secondary | ICD-10-CM | POA: Insufficient documentation

## 2020-07-10 DIAGNOSIS — I10 Essential (primary) hypertension: Secondary | ICD-10-CM | POA: Diagnosis not present

## 2020-07-10 DIAGNOSIS — R197 Diarrhea, unspecified: Secondary | ICD-10-CM | POA: Diagnosis not present

## 2020-07-10 DIAGNOSIS — R112 Nausea with vomiting, unspecified: Secondary | ICD-10-CM | POA: Diagnosis not present

## 2020-07-10 DIAGNOSIS — R1013 Epigastric pain: Secondary | ICD-10-CM | POA: Diagnosis not present

## 2020-07-10 LAB — CBC WITH DIFFERENTIAL/PLATELET
Abs Immature Granulocytes: 0.01 10*3/uL (ref 0.00–0.07)
Basophils Absolute: 0 10*3/uL (ref 0.0–0.1)
Basophils Relative: 0 %
Eosinophils Absolute: 0 10*3/uL (ref 0.0–0.5)
Eosinophils Relative: 0 %
HCT: 42.6 % (ref 36.0–46.0)
Hemoglobin: 12.9 g/dL (ref 12.0–15.0)
Immature Granulocytes: 0 %
Lymphocytes Relative: 7 %
Lymphs Abs: 0.5 10*3/uL — ABNORMAL LOW (ref 0.7–4.0)
MCH: 24.7 pg — ABNORMAL LOW (ref 26.0–34.0)
MCHC: 30.3 g/dL (ref 30.0–36.0)
MCV: 81.6 fL (ref 80.0–100.0)
Monocytes Absolute: 0.2 10*3/uL (ref 0.1–1.0)
Monocytes Relative: 4 %
Neutro Abs: 5.9 10*3/uL (ref 1.7–7.7)
Neutrophils Relative %: 89 %
Platelets: 281 10*3/uL (ref 150–400)
RBC: 5.22 MIL/uL — ABNORMAL HIGH (ref 3.87–5.11)
RDW: 14.3 % (ref 11.5–15.5)
WBC: 6.7 10*3/uL (ref 4.0–10.5)
nRBC: 0 % (ref 0.0–0.2)

## 2020-07-10 LAB — COMPREHENSIVE METABOLIC PANEL
ALT: 31 U/L (ref 0–44)
AST: 27 U/L (ref 15–41)
Albumin: 4.1 g/dL (ref 3.5–5.0)
Alkaline Phosphatase: 110 U/L (ref 38–126)
Anion gap: 10 (ref 5–15)
BUN: 18 mg/dL (ref 6–20)
CO2: 25 mmol/L (ref 22–32)
Calcium: 9.1 mg/dL (ref 8.9–10.3)
Chloride: 105 mmol/L (ref 98–111)
Creatinine, Ser: 0.81 mg/dL (ref 0.44–1.00)
GFR, Estimated: >60 mL/min (ref >60)
Glucose, Bld: 112 mg/dL — ABNORMAL HIGH (ref 70–99)
Potassium: 4 mmol/L (ref 3.5–5.1)
Sodium: 140 mmol/L (ref 135–145)
Total Bilirubin: 0.9 mg/dL (ref 0.3–1.2)
Total Protein: 8.1 g/dL (ref 6.5–8.1)

## 2020-07-10 LAB — LACTIC ACID, PLASMA: Lactic Acid, Venous: 0.9 mmol/L (ref 0.5–1.9)

## 2020-07-10 LAB — I-STAT BETA HCG BLOOD, ED (MC, WL, AP ONLY): I-stat hCG, quantitative: <5 m[IU]/mL (ref <5)

## 2020-07-10 LAB — LIPASE, BLOOD: Lipase: 28 U/L (ref 11–51)

## 2020-07-10 MED ORDER — ONDANSETRON HCL 4 MG/2ML IJ SOLN
4.0000 mg | Freq: Once | INTRAMUSCULAR | Status: AC
  Administered 2020-07-10: 4 mg via INTRAVENOUS
  Filled 2020-07-10: qty 2

## 2020-07-10 MED ORDER — LACTATED RINGERS IV BOLUS
1000.0000 mL | Freq: Once | INTRAVENOUS | Status: AC
  Administered 2020-07-10: 1000 mL via INTRAVENOUS

## 2020-07-10 NOTE — ED Provider Notes (Signed)
[facility]-EMERGENCY DEPT Provider Note   CSN: [id number] Arrival date & time: 07/10/20  2128     History Chief Complaint  Patient presents with  . Abdominal Pain    Bonnie Booth is a 20 y.o. female.  20 year old female with past medical history below including rheumatoid arthritis, hypertension, anemia, gestational diabetes who presents with vomiting, diarrhea, and abdominal pain.  Patient ate out at a restaurant last night and when she went to bed she was not feeling well.  This morning, she woke up and had sudden onset of nausea, vomiting, diarrhea, and intermittent central abdominal pain.  She states that she just feels weak and chilled currently.  She denies any urinary symptoms.  No sick contacts or recent travel.  Of note, she had laparoscopic appendectomy last month and states that she healed appropriately from it, was not having any problems until this morning's symptoms.  She denies any URI symptoms.  The history is provided by the patient.  Abdominal Pain      Past Medical History:  Diagnosis Date  . Anemia   . Hypertension   . Rheumatoid arthritis [facility])     Patient Active Problem List   Diagnosis Date Noted  . Appendicitis 06/20/2020  . Acute appendicitis 06/20/2020  . IUD (intrauterine device) in place 05/26/2019  . Perineal laceration, second degree 05/20/2019  . Normal spontaneous vaginal delivery 05/20/2019  . Labor and delivery, indication for care 05/19/2019  . Preeclampsia 05/19/2019  . Gestational diabetes 03/16/2019  . Alpha thalassemia silent carrier 12/02/2018  . Supervision of other normal pregnancy, antepartum 11/04/2018    Past Surgical History:  Procedure Laterality Date  . LAPAROSCOPIC APPENDECTOMY N/A 06/21/2020   Procedure: APPENDECTOMY LAPAROSCOPIC;  Surgeon: Quentin Ore, MD;  Location: [facility] ORS;  Service: General;  Laterality: N/A;  . NO PAST SURGERIES       OB History    Gravida  3   Para  1   Term   1   Preterm      AB  2   Living  1     SAB  2   IAB      Ectopic      Multiple  0   Live Births  1           History reviewed. No pertinent family history.  Social History   Tobacco Use  . Smoking status: Never Smoker  . Smokeless tobacco: Never Used  Vaping Use  . Vaping Use: Never used  Substance Use Topics  . Alcohol use: No  . Drug use: Not Currently    Home Medications Prior to Admission medications   Medication Sig Start Date End Date Taking? Authorizing Provider  acetaminophen (TYLENOL) 500 MG tablet Take 2 tablets (1,000 mg total) by mouth every 6 (six) hours as needed. 06/21/20 06/21/21 Yes Stechschulte, Hyman Hopes, MD  levonorgestrel (MIRENA) 20 MCG/24HR IUD 1 each by Intrauterine route once. Jan 2021 placed   Yes [provider]  oxyCODONE-acetaminophen (PERCOCET) 5-325 MG tablet Take 1 tablet by mouth every 4 (four) hours as needed for severe pain. 06/21/20 06/21/21  Stechschulte, Hyman Hopes, MD  enalapril (VASOTEC) 5 MG tablet Take 1 tablet (5 mg total) by mouth daily. Patient not taking: Reported on 03/05/2020 05/21/19 03/05/20  Shirlean Mylar, MD  ferrous sulfate 325 (65 FE) MG tablet Take 1 tablet (325 mg total) by mouth every other day. Patient not taking: Reported on 03/05/2020 05/22/19 03/05/20  Shirlean Mylar, MD  Allergies    Apple, Bee venom, Cinnamon, Mango flavor [flavoring agent], Pineapple, Prunus persica, Tomato, and Peach flavor  Review of Systems   Review of Systems  Gastrointestinal: Positive for abdominal pain.   All other systems reviewed and are negative except that which was mentioned in HPI  Physical Exam Updated Vital Signs BP 104/69   Pulse (!) 112   Temp 99.3 F (37.4 C) (Oral)   Resp 18   Ht 5\' 6"  (1.676 m)   Wt 93.9 kg   SpO2 100%   BMI 33.41 kg/m   Physical Exam Constitutional:      General: She is not in acute distress.    Appearance: Normal appearance.  HENT:     Head: Normocephalic and atraumatic.   Eyes:     Conjunctiva/sclera: Conjunctivae normal.  Cardiovascular:     Rate and Rhythm: Normal rate and regular rhythm.     Heart sounds: Normal heart sounds. No murmur heard.   Pulmonary:     Effort: Pulmonary effort is normal.     Breath sounds: Normal breath sounds.  Abdominal:     General: Abdomen is flat. Bowel sounds are normal. There is no distension.     Palpations: Abdomen is soft.     Tenderness: There is abdominal tenderness in the epigastric area and periumbilical area.  Musculoskeletal:     Right lower leg: No edema.     Left lower leg: No edema.  Skin:    General: Skin is warm and dry.  Neurological:     Mental Status: She is alert and oriented to person, place, and time.     Comments: fluent  Psychiatric:        Mood and Affect: Mood normal.        Behavior: Behavior normal.     ED Results / Procedures / Treatments   Labs (all labs ordered are listed, but only abnormal results are displayed) Labs Reviewed  COMPREHENSIVE METABOLIC PANEL - Abnormal; Notable for the following components:      Result Value   Glucose, Bld 112 (*)    All other components within normal limits  CBC WITH DIFFERENTIAL/PLATELET - Abnormal; Notable for the following components:   RBC 5.22 (*)    MCH 24.7 (*)    Lymphs Abs 0.5 (*)    All other components within normal limits  LACTIC ACID, PLASMA  LIPASE, BLOOD  URINALYSIS, ROUTINE W REFLEX MICROSCOPIC  I-STAT BETA HCG BLOOD, ED ([facility], [facility], [facility] ONLY)    EKG None  Radiology No results found.  Procedures Procedures   Medications Ordered in ED Medications  ondansetron (ZOFRAN) injection 4 mg (4 mg Intravenous Given 07/10/20 2219)  lactated ringers bolus 1,000 mL (0 mLs Intravenous Stopped 07/10/20 2326)  ondansetron (ZOFRAN) injection 4 mg (4 mg Intravenous Given 07/10/20 2334)    ED Course  I have reviewed the triage vital signs and the nursing notes.  Pertinent labs & imaging results that were available during my care of  the patient were reviewed by me and considered in my medical decision making (see chart for details).    MDM Rules/Calculators/A&P                          Pt tachycardic but w/ otherwise reassuring VS. Periumbilical and midepigastric abd tenderness on exam. Sx were sudden in onset and w/ presence of vomiting and diarrhea, ddx includes gastroenteritis. I discussed possibility of post-operative complication such as abscess. With  shared decision making, we decided to check labs and treat symptoms and reassess before imaging.  All labs including CBC, CMP, lipase, lactate are reassuring. Preg negative. On reassessment after zofran and fluids, pt reports no abd pain, just having low back pain. Added UA. She is still having mild nausea but w/ resolution of abd pain, will hold off on imaging for now especially given normal WBC count. I have ordered 2nd dose of zofran and pt signed out to overnight provider pending PO challenge and reassessment.  Final Clinical Impression(s) / ED Diagnoses Final diagnoses:  None    Rx / DC Orders ED Discharge Orders    None       Quaniyah Bugh, Ambrose Finland, MD 07/10/20 2342

## 2020-07-10 NOTE — ED Triage Notes (Signed)
Patient is complaining of abdominal pain, nausea, vomiting, and blurred vision. Patient states this started yesterday. Patient states she feels like it may be food poisoning.

## 2020-07-11 ENCOUNTER — Other Ambulatory Visit: Payer: Self-pay

## 2020-07-11 ENCOUNTER — Emergency Department (HOSPITAL_COMMUNITY): Payer: Medicaid Other

## 2020-07-11 LAB — URINALYSIS, ROUTINE W REFLEX MICROSCOPIC
Bilirubin Urine: NEGATIVE
Glucose, UA: NEGATIVE mg/dL
Ketones, ur: NEGATIVE mg/dL
Leukocytes,Ua: NEGATIVE
Nitrite: NEGATIVE
Protein, ur: NEGATIVE mg/dL
Specific Gravity, Urine: 1.021 (ref 1.005–1.030)
pH: 5 (ref 5.0–8.0)

## 2020-07-11 MED ORDER — IOHEXOL 300 MG/ML  SOLN
100.0000 mL | Freq: Once | INTRAMUSCULAR | Status: AC | PRN
  Administered 2020-07-11: 100 mL via INTRAVENOUS

## 2020-07-11 MED ORDER — ACETAMINOPHEN 500 MG PO TABS
1000.0000 mg | ORAL_TABLET | Freq: Once | ORAL | Status: AC
  Administered 2020-07-11: 1000 mg via ORAL
  Filled 2020-07-11: qty 2

## 2020-07-11 MED ORDER — LACTATED RINGERS IV BOLUS
1000.0000 mL | Freq: Once | INTRAVENOUS | Status: AC
  Administered 2020-07-11: 1000 mL via INTRAVENOUS

## 2020-07-11 NOTE — Discharge Instructions (Addendum)
Use Imodium as needed for any further diarrhea.

## 2020-07-11 NOTE — ED Provider Notes (Signed)
Patient was signed out to me by Dr. Clarene Duke to follow-up after treatment.  She presented with fairly sudden onset nausea, vomiting, diarrhea with abdominal pain.  Patient recently had uncomplicated appendicitis with appendectomy.  She was treated with antiemetics and IV fluids.  Upon recheck she was complaining of some recurrent abdominal pain.  At this time she was also found to be febrile.  Patient therefore was sent for chest x-ray and CT abdomen and pelvis.  No acute abnormalities are noted.  Likely infectious gastroenteritis.  Will discharge with symptomatic treatment.    Gilda Crease, MD 07/11/20 [phone]

## 2020-07-12 ENCOUNTER — Ambulatory Visit: Payer: Self-pay | Admitting: *Deleted

## 2020-07-12 NOTE — Telephone Encounter (Signed)
Pt reports diarrhea and "Gassy" , onset 2 days ago. Had appendectomy 06/20/20. Seen in ED for similar symptoms 07/10/20, received IV flds. States "Maybe diarrhea 3xs day." Reports abdominal cramping at times. States stools are watery. Has not been on antibiotics. Denies fever, no signs of dehydration. States husband also has had vomiting after they ate at restaurant yesterday. Home care advise given. Advised to alert PCP, states still sees Zoe Lan at [facility]. Advised ED if symptoms worsen. Pt verbalizes understanding. States will call PCP.  Reason for Disposition . MILD-MODERATE diarrhea (e.g., 1-6 times / day more than normal)  Answer Assessment - Initial Assessment Questions 1. DIARRHEA SEVERITY: "How bad is the diarrhea?" "How many extra stools have you had in the past 24 hours than normal?"    - NO DIARRHEA (SCALE 0)   - MILD (SCALE 1-3): Few loose or mushy BMs; increase of 1-3 stools over normal daily number of stools; mild increase in ostomy output.   -  MODERATE (SCALE 4-7): Increase of 4-6 stools daily over normal; moderate increase in ostomy output. * SEVERE (SCALE 8-10; OR 'WORST POSSIBLE'): Increase of 7 or more stools daily over normal; moderate increase in ostomy output; incontinence.     Mild 3xs daily 2. ONSET: "When did the diarrhea begin?"      2 days ago 3. BM CONSISTENCY: "How loose or watery is the diarrhea?"      Watery 4. VOMITING: "Are you also vomiting?" If Yes, ask: "How many times in the past 24 hours?"      No, mild nausea last night 5. ABDOMINAL PAIN: "Are you having any abdominal pain?" If Yes, ask: "What does it feel like?" (e.g., crampy, dull, intermittent, constant)      Cramping, gassy. Towards back 6. ABDOMINAL PAIN SEVERITY: If present, ask: "How bad is the pain?"  (e.g., Scale 1-10; mild, moderate, or severe)   - MILD (1-3): doesn't interfere with normal activities, abdomen soft and not tender to touch    - MODERATE (4-7): interferes with normal  activities or awakens from sleep, tender to touch    - SEVERE (8-10): excruciating pain, doubled over, unable to do any normal activities       moderate 7. ORAL INTAKE: If vomiting, "Have you been able to drink liquids?" "How much fluids have you had in the past 24 hours?"     no 8. HYDRATION: "Any signs of dehydration?" (e.g., dry mouth [not just dry lips], too weak to stand, dizziness, new weight loss) "When did you last urinate?"     No 10. ANTIBIOTIC USE: "Are you taking antibiotics now or have you taken antibiotics in the past 2 months?"       none 11. OTHER SYMPTOMS: "Do you have any other symptoms?" (e.g., fever, blood in stool)       no  Protocols used: DIARRHEA-A-AH

## 2020-08-05 ENCOUNTER — Encounter (HOSPITAL_COMMUNITY): Payer: Self-pay | Admitting: Emergency Medicine

## 2020-08-05 DIAGNOSIS — K625 Hemorrhage of anus and rectum: Secondary | ICD-10-CM | POA: Diagnosis present

## 2020-08-05 DIAGNOSIS — Z5321 Procedure and treatment not carried out due to patient leaving prior to being seen by health care provider: Secondary | ICD-10-CM | POA: Diagnosis not present

## 2020-08-05 DIAGNOSIS — R1033 Periumbilical pain: Secondary | ICD-10-CM | POA: Insufficient documentation

## 2020-08-05 DIAGNOSIS — R112 Nausea with vomiting, unspecified: Secondary | ICD-10-CM | POA: Insufficient documentation

## 2020-08-05 LAB — CBC WITH DIFFERENTIAL/PLATELET
Abs Immature Granulocytes: 0.03 10*3/uL (ref 0.00–0.07)
Basophils Absolute: 0 10*3/uL (ref 0.0–0.1)
Basophils Relative: 0 %
Eosinophils Absolute: 0.1 10*3/uL (ref 0.0–0.5)
Eosinophils Relative: 1 %
HCT: 39.9 % (ref 36.0–46.0)
Hemoglobin: 12 g/dL (ref 12.0–15.0)
Immature Granulocytes: 0 %
Lymphocytes Relative: 38 %
Lymphs Abs: 2.9 10*3/uL (ref 0.7–4.0)
MCH: 24.7 pg — ABNORMAL LOW (ref 26.0–34.0)
MCHC: 30.1 g/dL (ref 30.0–36.0)
MCV: 82.1 fL (ref 80.0–100.0)
Monocytes Absolute: 0.4 10*3/uL (ref 0.1–1.0)
Monocytes Relative: 5 %
Neutro Abs: 4.3 10*3/uL (ref 1.7–7.7)
Neutrophils Relative %: 56 %
Platelets: 319 10*3/uL (ref 150–400)
RBC: 4.86 MIL/uL (ref 3.87–5.11)
RDW: 14.3 % (ref 11.5–15.5)
WBC: 7.7 10*3/uL (ref 4.0–10.5)
nRBC: 0 % (ref 0.0–0.2)

## 2020-08-05 LAB — COMPREHENSIVE METABOLIC PANEL
ALT: 26 U/L (ref 0–44)
AST: 27 U/L (ref 15–41)
Albumin: 4 g/dL (ref 3.5–5.0)
Alkaline Phosphatase: 111 U/L (ref 38–126)
Anion gap: 8 (ref 5–15)
BUN: 18 mg/dL (ref 6–20)
CO2: 26 mmol/L (ref 22–32)
Calcium: 9.4 mg/dL (ref 8.9–10.3)
Chloride: 106 mmol/L (ref 98–111)
Creatinine, Ser: 0.84 mg/dL (ref 0.44–1.00)
GFR, Estimated: >60 mL/min (ref >60)
Glucose, Bld: 107 mg/dL — ABNORMAL HIGH (ref 70–99)
Potassium: 4.2 mmol/L (ref 3.5–5.1)
Sodium: 140 mmol/L (ref 135–145)
Total Bilirubin: 0.6 mg/dL (ref 0.3–1.2)
Total Protein: 7.4 g/dL (ref 6.5–8.1)

## 2020-08-05 LAB — I-STAT BETA HCG BLOOD, ED (MC, WL, AP ONLY): I-stat hCG, quantitative: <5 m[IU]/mL (ref <5)

## 2020-08-05 LAB — LIPASE, BLOOD: Lipase: 37 U/L (ref 11–51)

## 2020-08-05 NOTE — ED Triage Notes (Signed)
Emergency Medicine Provider Triage Evaluation Note  Bonnie Booth , a 20 y.o. female  was evaluated in triage.  Pt complains of rectal bleeding, nausea, vomiting and abdominal pain.  Patient reports she has been see ing blood in her stool for "months."  Today however she had a "gush" of blood with her bowel movement.  Endorses nausea, vomiting and periumbilical abdominal cramping over the last few days.   Review of Systems  Positive: Rectal bleeding, nausea, vomiting, abdominal pain Negative: Fever, chills, dysuria, hematuria, urinary frequency  Physical Exam  BP 129/66 (BP Location: Right Arm)   Pulse 85   Temp 98.1 F (36.7 C) (Oral)   Resp 18   Ht 5\' 6"  (1.676 m)   Wt 94.3 kg   SpO2 96%   BMI 33.57 kg/m  Gen:   Awake, no distress   HEENT:  Atraumatic  Resp:  Normal effort  Cardiac:  Normal rate  Abd:   Nondistended, nontender  MSK:   Moves extremities without difficulty  Neuro:  Speech clear   Medical Decision Making  Medically screening exam initiated at 8:52 PM.  Appropriate orders placed.  Bonnie Booth was informed that the remainder of the evaluation will be completed by another provider, this initial triage assessment does not replace that evaluation, and the importance of remaining in the ED until their evaluation is complete.  Clinical Impression   The patient appears stable so that the remainder of the work up may be completed by another provider.      Theora Gianotti, Haskel Schroeder 08/05/20 2054

## 2020-08-05 NOTE — ED Triage Notes (Addendum)
Patient reports intermittent rectal bleeding with blood in stool x2 weeks. Reports today only blood with clots output. Reports abdominal cramping. Denies N/V.

## 2020-08-06 ENCOUNTER — Emergency Department (HOSPITAL_COMMUNITY)
Admission: EM | Admit: 2020-08-06 | Discharge: 2020-08-06 | Disposition: A | Discharge status: Home / Self Care | Payer: Medicaid Other | Attending: Emergency Medicine | Admitting: Emergency Medicine

## 2020-08-06 NOTE — ED Notes (Signed)
Called 3x for room placement. Eloped from waiting area.  

## 2020-09-16 ENCOUNTER — Encounter (HOSPITAL_COMMUNITY): Payer: Self-pay | Admitting: *Deleted

## 2020-09-16 ENCOUNTER — Other Ambulatory Visit: Payer: Self-pay

## 2020-09-16 ENCOUNTER — Emergency Department (HOSPITAL_COMMUNITY)
Admission: EM | Admit: 2020-09-16 | Discharge: 2020-09-16 | Discharge status: Against Medical Advice | Payer: Medicaid Other

## 2020-09-16 ENCOUNTER — Ambulatory Visit (HOSPITAL_COMMUNITY)
Admission: EM | Admit: 2020-09-16 | Discharge: 2020-09-16 | Disposition: A | Discharge status: Home / Self Care | Payer: Medicaid Other | Attending: Physician Assistant | Admitting: Physician Assistant

## 2020-09-16 ENCOUNTER — Emergency Department (HOSPITAL_COMMUNITY)
Admission: EM | Admit: 2020-09-16 | Discharge: 2020-09-17 | Disposition: A | Discharge status: Home / Self Care | Payer: Medicaid Other | Attending: Emergency Medicine | Admitting: Emergency Medicine

## 2020-09-16 DIAGNOSIS — I10 Essential (primary) hypertension: Secondary | ICD-10-CM | POA: Diagnosis not present

## 2020-09-16 DIAGNOSIS — R1110 Vomiting, unspecified: Secondary | ICD-10-CM | POA: Diagnosis not present

## 2020-09-16 DIAGNOSIS — R1013 Epigastric pain: Secondary | ICD-10-CM | POA: Insufficient documentation

## 2020-09-16 DIAGNOSIS — M545 Low back pain, unspecified: Secondary | ICD-10-CM

## 2020-09-16 DIAGNOSIS — R160 Hepatomegaly, not elsewhere classified: Secondary | ICD-10-CM | POA: Diagnosis not present

## 2020-09-16 DIAGNOSIS — R112 Nausea with vomiting, unspecified: Secondary | ICD-10-CM

## 2020-09-16 DIAGNOSIS — Z5321 Procedure and treatment not carried out due to patient leaving prior to being seen by health care provider: Secondary | ICD-10-CM | POA: Insufficient documentation

## 2020-09-16 LAB — COMPREHENSIVE METABOLIC PANEL
ALT: 27 U/L (ref 0–44)
AST: 30 U/L (ref 15–41)
Albumin: 3.7 g/dL (ref 3.5–5.0)
Alkaline Phosphatase: 111 U/L (ref 38–126)
Anion gap: 6 (ref 5–15)
BUN: 18 mg/dL (ref 6–20)
CO2: 30 mmol/L (ref 22–32)
Calcium: 9.2 mg/dL (ref 8.9–10.3)
Chloride: 104 mmol/L (ref 98–111)
Creatinine, Ser: 0.82 mg/dL (ref 0.44–1.00)
GFR, Estimated: >60 mL/min (ref >60)
Glucose, Bld: 87 mg/dL (ref 70–99)
Potassium: 4.7 mmol/L (ref 3.5–5.1)
Sodium: 140 mmol/L (ref 135–145)
Total Bilirubin: 0.6 mg/dL (ref 0.3–1.2)
Total Protein: 7.7 g/dL (ref 6.5–8.1)

## 2020-09-16 LAB — POCT URINALYSIS DIPSTICK, ED / UC
Bilirubin Urine: NEGATIVE
Glucose, UA: NEGATIVE mg/dL
Hgb urine dipstick: NEGATIVE
Leukocytes,Ua: NEGATIVE
Nitrite: NEGATIVE
Protein, ur: NEGATIVE mg/dL
Specific Gravity, Urine: 1.02 (ref 1.005–1.030)
Urobilinogen, UA: 4 mg/dL — ABNORMAL HIGH (ref 0.0–1.0)
pH: 7 (ref 5.0–8.0)

## 2020-09-16 LAB — CBC WITH DIFFERENTIAL/PLATELET
Abs Immature Granulocytes: 0.02 10*3/uL (ref 0.00–0.07)
Basophils Absolute: 0.1 10*3/uL (ref 0.0–0.1)
Basophils Relative: 1 %
Eosinophils Absolute: 0.1 10*3/uL (ref 0.0–0.5)
Eosinophils Relative: 1 %
HCT: 40.5 % (ref 36.0–46.0)
Hemoglobin: 12.4 g/dL (ref 12.0–15.0)
Immature Granulocytes: 0 %
Lymphocytes Relative: 37 %
Lymphs Abs: 3.2 10*3/uL (ref 0.7–4.0)
MCH: 24.8 pg — ABNORMAL LOW (ref 26.0–34.0)
MCHC: 30.6 g/dL (ref 30.0–36.0)
MCV: 81 fL (ref 80.0–100.0)
Monocytes Absolute: 0.5 10*3/uL (ref 0.1–1.0)
Monocytes Relative: 5 %
Neutro Abs: 4.8 10*3/uL (ref 1.7–7.7)
Neutrophils Relative %: 56 %
Platelets: 343 10*3/uL (ref 150–400)
RBC: 5 MIL/uL (ref 3.87–5.11)
RDW: 14.2 % (ref 11.5–15.5)
WBC: 8.7 10*3/uL (ref 4.0–10.5)
nRBC: 0 % (ref 0.0–0.2)

## 2020-09-16 LAB — POC URINE PREG, ED: Preg Test, Ur: NEGATIVE

## 2020-09-16 LAB — LIPASE, BLOOD: Lipase: 32 U/L (ref 11–51)

## 2020-09-16 MED ORDER — LIDOCAINE VISCOUS HCL 2 % MT SOLN
15.0000 mL | Freq: Once | OROMUCOSAL | Status: AC
  Administered 2020-09-17: 15 mL via ORAL
  Filled 2020-09-16: qty 15

## 2020-09-16 MED ORDER — ONDANSETRON 4 MG PO TBDP: 4.0000 mg | ORAL_TABLET | Freq: Three times a day (TID) | ORAL | 0 refills | Status: DC | PRN

## 2020-09-16 MED ORDER — ALUM & MAG HYDROXIDE-SIMETH 200-200-20 MG/5ML PO SUSP
30.0000 mL | Freq: Once | ORAL | Status: AC
  Administered 2020-09-17: 30 mL via ORAL
  Filled 2020-09-16: qty 30

## 2020-09-16 NOTE — ED Triage Notes (Signed)
Abdominal pain and vomiting since yesterday. Denies any fever, chills and vaginal bleeding.

## 2020-09-16 NOTE — ED Provider Notes (Signed)
Emergency Medicine Provider Triage Evaluation Note  Bonnie Booth , a 20 y.o. female  was evaluated in triage.  Pt complains of epigastric abdominal pain.  She reports associated irregular BMs.    Was seen today at [facility] and had labs done that are in the EMR.  Resulted this afternoon.  Normal lipase, no leukocytosis, normal LFTs.  Review of Systems  Positive: Epigastric abdominal pain, vomiting Negative: Fever  Physical Exam  BP (!) 132/52 (BP Location: Right Arm)   Pulse 85   Resp 16   SpO2 100%  Gen:   Awake, no distress   Resp:  Normal effort  MSK:   Moves extremities without difficulty  Other:  Mild epigastric tenderness  Medical Decision Making  Medically screening exam initiated at 11:55 PM.  Appropriate orders placed.  Bonnie Booth was informed that the remainder of the evaluation will be completed by another provider, this initial triage assessment does not replace that evaluation, and the importance of remaining in the ED until their evaluation is complete.  Normal labs from this afternoon.  Not repeated in triage. Given GI cocktail.    Taheerah, Guldin, PA-C 09/16/20 2357    Dione Booze, MD 09/17/20 [phone]

## 2020-09-16 NOTE — ED Triage Notes (Signed)
Pt reports constipation and lower back pain . Pt has tried Laxatives with out results.

## 2020-09-16 NOTE — ED Provider Notes (Signed)
[facility]    CSN: [id number] Arrival date & time: 09/16/20  1527      History   Chief Complaint Chief Complaint  Patient presents with  . Abdominal Pain  . Back Pain    HPI Bonnie Booth is a 20 y.o. female.   Patient presents today with a 2-day history of upper abdominal pain.  She reports pain is rated 7 on a 0-10 pain scale, localized to epigastrium without radiation, described as gnawing, no aggravating relieving factors identified.  She did try Tylenol and ibuprofen without improvement of symptoms.  She denies history of gastrointestinal disorder including GERD, peptic ulcer, Crohn's disease, ulcerative colitis.  She does have a history of constipation and has been using stimulant laxatives with minimal improvement of symptoms.  Reports last bowel movement was small but normal yesterday morning.  She denies any changes to her diet.  Denies any regular medication use including GLP agonist.  She denies history of hypertriglyceridemia.  She does not drink alcohol.  She is having difficulty with daily activities as result of symptoms.  Reports 1 episode of nausea and vomiting yesterday but has been able to eat and drink today without difficulty.     Past Medical History:  Diagnosis Date  . Anemia   . Hypertension   . Rheumatoid arthritis [facility])     Patient Active Problem List   Diagnosis Date Noted  . Appendicitis 06/20/2020  . Acute appendicitis 06/20/2020  . IUD (intrauterine device) in place 05/26/2019  . Perineal laceration, second degree 05/20/2019  . Normal spontaneous vaginal delivery 05/20/2019  . Labor and delivery, indication for care 05/19/2019  . Preeclampsia 05/19/2019  . Gestational diabetes 03/16/2019  . Alpha thalassemia silent carrier 12/02/2018  . Supervision of other normal pregnancy, antepartum 11/04/2018    Past Surgical History:  Procedure Laterality Date  . APPENDECTOMY  05/2020  . LAPAROSCOPIC APPENDECTOMY N/A 06/21/2020    Procedure: APPENDECTOMY LAPAROSCOPIC;  Surgeon: Quentin Ore, MD;  Location: [facility];  Service: General;  Laterality: N/A;  . NO PAST SURGERIES      OB History    Gravida  3   Para  1   Term  1   Preterm      AB  2   Living  1     SAB  2   IAB      Ectopic      Multiple  0   Live Births  1            Home Medications    Prior to Admission medications   Medication Sig Start Date End Date Taking? Authorizing Provider  ondansetron (ZOFRAN ODT) 4 MG disintegrating tablet Take 1 tablet (4 mg total) by mouth every 8 (eight) hours as needed for nausea or vomiting. 09/16/20  Yes Seaborn Nakama K, PA-C  acetaminophen (TYLENOL) 500 MG tablet Take 2 tablets (1,000 mg total) by mouth every 6 (six) hours as needed. 06/21/20 06/21/21  Stechschulte, Hyman Hopes, MD  levonorgestrel (MIRENA) 20 MCG/24HR IUD 1 each by Intrauterine route once. Jan 2021 placed    [provider]  oxyCODONE-acetaminophen (PERCOCET) 5-325 MG tablet Take 1 tablet by mouth every 4 (four) hours as needed for severe pain. 06/21/20 06/21/21  Stechschulte, Hyman Hopes, MD  enalapril (VASOTEC) 5 MG tablet Take 1 tablet (5 mg total) by mouth daily. Patient not taking: Reported on 03/05/2020 05/21/19 03/05/20  Shirlean Mylar, MD  ferrous sulfate 325 (65 FE) MG tablet Take 1  tablet (325 mg total) by mouth every other day. Patient not taking: Reported on 03/05/2020 05/22/19 03/05/20  Shirlean Mylar, MD    Family History History reviewed. No pertinent family history.  Social History Social History   Tobacco Use  . Smoking status: Never Smoker  . Smokeless tobacco: Never Used  Vaping Use  . Vaping Use: Never used  Substance Use Topics  . Alcohol use: No  . Drug use: Not Currently     Allergies   Apple, Bee venom, Cinnamon, Mango flavor [flavoring agent], Pineapple, Prunus persica, Tomato, and Peach flavor   Review of Systems Review of Systems  Constitutional: Positive for appetite change. Negative  for activity change and fatigue.  Respiratory: Negative for cough and shortness of breath.   Cardiovascular: Negative for chest pain.  Gastrointestinal: Positive for abdominal pain, constipation, nausea and vomiting. Negative for blood in stool and diarrhea.  Genitourinary: Negative for dysuria, frequency, urgency, vaginal bleeding, vaginal discharge and vaginal pain.  Musculoskeletal: Positive for back pain. Negative for arthralgias and myalgias.  Neurological: Negative for dizziness, light-headedness and headaches.     Physical Exam Triage Vital Signs ED Triage Vitals  Enc Vitals Group     BP 09/16/20 1700 118/65     Pulse Rate 09/16/20 1700 88     Resp --      Temp 09/16/20 1700 98.4 F (36.9 C)     Temp src --      SpO2 09/16/20 1700 96 %     Weight --      Height --      Head Circumference --      Peak Flow --      Pain Score 09/16/20 1657 7     Pain Loc --      Pain Edu? --      Excl. in GC? --    No data found.  Updated Vital Signs BP 118/65   Pulse 88   Temp 98.4 F (36.9 C)   SpO2 96%   Visual Acuity Right Eye Distance:   Left Eye Distance:   Bilateral Distance:    Right Eye Near:   Left Eye Near:    Bilateral Near:     Physical Exam Vitals reviewed.  Constitutional:      General: She is awake. She is not in acute distress.    Appearance: Normal appearance. She is not ill-appearing.     Comments: Very pleasant female appears stated age in no acute distress  HENT:     Head: Normocephalic and atraumatic.     Mouth/Throat:     Pharynx: Uvula midline. No oropharyngeal exudate or posterior oropharyngeal erythema.  Cardiovascular:     Rate and Rhythm: Normal rate and regular rhythm.     Heart sounds: No murmur heard.   Pulmonary:     Effort: Pulmonary effort is normal.     Breath sounds: Normal breath sounds. No wheezing, rhonchi or rales.     Comments: Clear to auscultation bilaterally Abdominal:     General: Bowel sounds are normal.      Palpations: Abdomen is soft.     Tenderness: There is abdominal tenderness in the epigastric area. There is no right CVA tenderness, left CVA tenderness, guarding or rebound. Negative signs include Murphy's sign.     Comments: Mild tenderness palpation throughout epigastrium.  No evidence of acute abdomen on physical exam.  No CVA tenderness.  Psychiatric:        Behavior: Behavior is cooperative.  UC Treatments / Results  Labs (all labs ordered are listed, but only abnormal results are displayed) Labs Reviewed  POCT URINALYSIS DIPSTICK, ED / UC - Abnormal; Notable for the following components:      Result Value   Ketones, ur TRACE (*)    Urobilinogen, UA 4.0 (*)    All other components within normal limits  URINE CULTURE  CBC WITH DIFFERENTIAL/PLATELET  COMPREHENSIVE METABOLIC PANEL  LIPASE, BLOOD  POC URINE PREG, ED    EKG   Radiology No results found.  Procedures Procedures (including critical care time)  Medications Ordered in UC Medications - No data to display  Initial Impression / Assessment and Plan / UC Course  I have reviewed the triage vital signs and the nursing notes.  Pertinent labs & imaging results that were available during my care of the patient were reviewed by me and considered in my medical decision making (see chart for details).     Vital signs and physical exam reassuring today; no indication for emergent evaluation or imaging.  UA showed no evidence of infection.  Urine pregnancy was negative.  CBC, CMP, lipase obtained today-results pending.  Patient was provided prescription for Zofran to be used as needed up to 3 times a day.  Recommended she drink plenty of fluid and eat a bland diet.  Discussed that if symptoms do not significantly improve within the next 12 to 24 hours she would need a CT scan which is not something we can arrange and will need to be done through either her PCP or ER.  Discussed alarm symptoms that warrant emergent  evaluation.  Strict return precautions given to which patient expressed understanding.  Final Clinical Impressions(s) / UC Diagnoses   Final diagnoses:  Epigastric abdominal pain  Nausea and vomiting, intractability of vomiting not specified, unspecified vomiting type  Acute bilateral low back pain without sciatica     Discharge Instructions     Use Zofran up to 3 times a day as needed for nausea and vomiting.  Drink plenty of fluid and eat a bland diet.  Avoid alcohol and fatty foods.  If your symptoms worsen please go to the emergency room for CT scan as we discussed.    ED Prescriptions    Medication Sig Dispense Auth. Provider   ondansetron (ZOFRAN ODT) 4 MG disintegrating tablet Take 1 tablet (4 mg total) by mouth every 8 (eight) hours as needed for nausea or vomiting. 20 tablet Izaiyah Kleinman, Noberto Retort, PA-C     PDMP not reviewed this encounter.   Jeani Hawking, PA-C 09/16/20 1727

## 2020-09-16 NOTE — Discharge Instructions (Addendum)
Use Zofran up to 3 times a day as needed for nausea and vomiting.  Drink plenty of fluid and eat a bland diet.  Avoid alcohol and fatty foods.  If your symptoms worsen please go to the emergency room for CT scan as we discussed.

## 2020-09-17 ENCOUNTER — Encounter (HOSPITAL_COMMUNITY): Payer: Self-pay

## 2020-09-17 ENCOUNTER — Other Ambulatory Visit: Payer: Self-pay

## 2020-09-17 ENCOUNTER — Emergency Department (HOSPITAL_COMMUNITY): Payer: Medicaid Other

## 2020-09-17 ENCOUNTER — Emergency Department (HOSPITAL_COMMUNITY)
Admission: EM | Admit: 2020-09-17 | Discharge: 2020-09-17 | Disposition: A | Discharge status: Home / Self Care | Payer: Medicaid Other | Source: Home / Self Care | Attending: Emergency Medicine | Admitting: Emergency Medicine

## 2020-09-17 DIAGNOSIS — Z79899 Other long term (current) drug therapy: Secondary | ICD-10-CM | POA: Insufficient documentation

## 2020-09-17 DIAGNOSIS — I10 Essential (primary) hypertension: Secondary | ICD-10-CM | POA: Insufficient documentation

## 2020-09-17 DIAGNOSIS — R160 Hepatomegaly, not elsewhere classified: Secondary | ICD-10-CM | POA: Insufficient documentation

## 2020-09-17 DIAGNOSIS — R1013 Epigastric pain: Secondary | ICD-10-CM

## 2020-09-17 LAB — COMPREHENSIVE METABOLIC PANEL
ALT: 24 U/L (ref 0–44)
AST: 24 U/L (ref 15–41)
Albumin: 3.6 g/dL (ref 3.5–5.0)
Alkaline Phosphatase: 99 U/L (ref 38–126)
Anion gap: 5 (ref 5–15)
BUN: 20 mg/dL (ref 6–20)
CO2: 26 mmol/L (ref 22–32)
Calcium: 8.9 mg/dL (ref 8.9–10.3)
Chloride: 107 mmol/L (ref 98–111)
Creatinine, Ser: 0.85 mg/dL (ref 0.44–1.00)
GFR, Estimated: >60 mL/min (ref >60)
Glucose, Bld: 138 mg/dL — ABNORMAL HIGH (ref 70–99)
Potassium: 3.7 mmol/L (ref 3.5–5.1)
Sodium: 138 mmol/L (ref 135–145)
Total Bilirubin: 0.5 mg/dL (ref 0.3–1.2)
Total Protein: 7.5 g/dL (ref 6.5–8.1)

## 2020-09-17 LAB — CBC
HCT: 38.9 % (ref 36.0–46.0)
Hemoglobin: 11.7 g/dL — ABNORMAL LOW (ref 12.0–15.0)
MCH: 24.7 pg — ABNORMAL LOW (ref 26.0–34.0)
MCHC: 30.1 g/dL (ref 30.0–36.0)
MCV: 82.1 fL (ref 80.0–100.0)
Platelets: 301 10*3/uL (ref 150–400)
RBC: 4.74 MIL/uL (ref 3.87–5.11)
RDW: 14.3 % (ref 11.5–15.5)
WBC: 6.9 10*3/uL (ref 4.0–10.5)
nRBC: 0 % (ref 0.0–0.2)

## 2020-09-17 LAB — LIPASE, BLOOD: Lipase: 31 U/L (ref 11–51)

## 2020-09-17 LAB — I-STAT BETA HCG BLOOD, ED (MC, WL, AP ONLY): I-stat hCG, quantitative: <5 m[IU]/mL (ref <5)

## 2020-09-17 LAB — URINALYSIS, ROUTINE W REFLEX MICROSCOPIC
Bilirubin Urine: NEGATIVE
Glucose, UA: NEGATIVE mg/dL
Hgb urine dipstick: NEGATIVE
Ketones, ur: NEGATIVE mg/dL
Leukocytes,Ua: NEGATIVE
Nitrite: NEGATIVE
Protein, ur: NEGATIVE mg/dL
Specific Gravity, Urine: 1.025 (ref 1.005–1.030)
pH: 6 (ref 5.0–8.0)

## 2020-09-17 MED ORDER — PANTOPRAZOLE SODIUM 20 MG PO TBEC: 40.0000 mg | DELAYED_RELEASE_TABLET | Freq: Every day | ORAL | 0 refills | Status: DC

## 2020-09-17 MED ORDER — ONDANSETRON HCL 4 MG PO TABS: 4.0000 mg | ORAL_TABLET | Freq: Four times a day (QID) | ORAL | 0 refills | Status: DC

## 2020-09-17 MED ORDER — DICYCLOMINE HCL 20 MG PO TABS: 20.0000 mg | ORAL_TABLET | Freq: Two times a day (BID) | ORAL | 0 refills | Status: DC

## 2020-09-17 NOTE — ED Triage Notes (Signed)
Patient c/o upper abdominal pain x 3 days and states she vomited x 1 only. Patient denies any diarrhea.

## 2020-09-17 NOTE — ED Provider Notes (Signed)
Emergency Medicine Provider Triage Evaluation Note  Bonnie Booth 20 y.o. female was evaluated in triage.  Pt complains of upper abdominal pain x3 days.  States it started radiating towards the middle.  She has 1 episode of vomiting.  No diarrhea.  States she is not regular with her bowel movements.  No fevers, urinary complaints.   Review of Systems  Positive: Abdominal pain, vomiting Negative: Fever, urinary complaints.  Physical Exam  BP 134/82   Pulse 70   Temp 98.2 F (36.8 C) (Oral)   Resp 18   Ht 5\' 4"  (1.626 m)   Wt 65.8 kg   SpO2 100%   BMI 24.89 kg/m  Gen:   Awake, no distress   HEENT:  Atraumatic  Resp:  Normal effort  Cardiac:  Normal rate  Abd:   Nondistended, diffuse tenderness in epigastric and middle abdomen.  No rigidity, guarding. MSK:   Moves extremities without difficulty  Neuro:  Speech clear   Other:      Medical Decision Making  Medically screening exam initiated at 12:35 PM  Appropriate orders placed.  Bonnie Booth was informed that the remainder of the evaluation will be completed by another provider, this initial triage assessment does not replace that evaluation. They are counseled that they will need to remain in the ED until the completion of their workup, including full H&P and results of any tests.  Risks of leaving the emergency department prior to completion of treatment were discussed. Patient was advised to inform ED staff if they are leaving before their treatment is complete. The patient acknowledged these risks and time was allowed for questions.     The patient appears stable so that the remainder of the MSE may be completed by another provider.    Clinical Impression  Abdominal pain   Portions of this note were generated with Dragon dictation software. Dictation errors may occur despite best attempts at proofreading.      Theora Gianotti, PA-C 09/17/20 1236    09/19/20, DO 09/17/20 1522

## 2020-09-17 NOTE — ED Provider Notes (Signed)
[facility]-EMERGENCY DEPT Provider Note   CSN: [id number] Arrival date & time: 09/17/20  1159     History Chief Complaint  Patient presents with  . Abdominal Pain    Bonnie Booth is a 20 y.o. female.  HPI      Presents with concern for epigastric pain, began Friday, worse with eating. Had some episodes of it before, sometimes after eating, sometimes random times.  THis time has been constant pain in epigastrium, sharp, constant pain.  Nausea but no vomiting. Vomited one time the day before yesterday. No urinary symptoms. No vaginal discharge or bleeding, IUD in place . 8/10  Chronic constipation, appendicitis in Feb  Past Medical History:  Diagnosis Date  . Anemia   . Hypertension   . Rheumatoid arthritis [facility])     Patient Active Problem List   Diagnosis Date Noted  . Appendicitis 06/20/2020  . Acute appendicitis 06/20/2020  . IUD (intrauterine device) in place 05/26/2019  . Perineal laceration, second degree 05/20/2019  . Normal spontaneous vaginal delivery 05/20/2019  . Labor and delivery, indication for care 05/19/2019  . Preeclampsia 05/19/2019  . Gestational diabetes 03/16/2019  . Alpha thalassemia silent carrier 12/02/2018  . Supervision of other normal pregnancy, antepartum 11/04/2018    Past Surgical History:  Procedure Laterality Date  . APPENDECTOMY  05/2020  . LAPAROSCOPIC APPENDECTOMY N/A 06/21/2020   Procedure: APPENDECTOMY LAPAROSCOPIC;  Surgeon: Quentin Ore, MD;  Location: [facility] ORS;  Service: General;  Laterality: N/A;  . NO PAST SURGERIES       OB History    Gravida  3   Para  1   Term  1   Preterm      AB  2   Living  1     SAB  2   IAB      Ectopic      Multiple  0   Live Births  1           History reviewed. No pertinent family history.  Social History   Tobacco Use  . Smoking status: Never Smoker  . Smokeless tobacco: Never Used  Vaping Use  . Vaping Use: Never used  Substance  Use Topics  . Alcohol use: No  . Drug use: Not Currently    Home Medications Prior to Admission medications   Medication Sig Start Date End Date Taking? Authorizing Provider  dicyclomine (BENTYL) 20 MG tablet Take 1 tablet (20 mg total) by mouth 2 (two) times daily. 09/17/20  Yes Alvira Monday, MD  ondansetron (ZOFRAN) 4 MG tablet Take 1 tablet (4 mg total) by mouth every 6 (six) hours. 09/17/20  Yes Alvira Monday, MD  pantoprazole (PROTONIX) 20 MG tablet Take 2 tablets (40 mg total) by mouth daily for 14 days. 09/17/20 10/01/20 Yes Alvira Monday, MD  acetaminophen (TYLENOL) 500 MG tablet Take 2 tablets (1,000 mg total) by mouth every 6 (six) hours as needed. 06/21/20 06/21/21  Stechschulte, Hyman Hopes, MD  levonorgestrel (MIRENA) 20 MCG/24HR IUD 1 each by Intrauterine route once. Jan 2021 placed    [provider]  ondansetron (ZOFRAN ODT) 4 MG disintegrating tablet Take 1 tablet (4 mg total) by mouth every 8 (eight) hours as needed for nausea or vomiting. 09/16/20   Raspet, Noberto Retort, PA-C  oxyCODONE-acetaminophen (PERCOCET) 5-325 MG tablet Take 1 tablet by mouth every 4 (four) hours as needed for severe pain. 06/21/20 06/21/21  Stechschulte, Hyman Hopes, MD  enalapril (VASOTEC) 5 MG tablet Take 1  tablet (5 mg total) by mouth daily. Patient not taking: Reported on 03/05/2020 05/21/19 03/05/20  Shirlean Mylar, MD  ferrous sulfate 325 (65 FE) MG tablet Take 1 tablet (325 mg total) by mouth every other day. Patient not taking: Reported on 03/05/2020 05/22/19 03/05/20  Shirlean Mylar, MD    Allergies    Apple, Bee venom, Cinnamon, Mango flavor [flavoring agent], Pineapple, Prunus persica, Tomato, and Peach flavor  Review of Systems   Review of Systems  Constitutional: Negative for fever.  Respiratory: Negative for cough and shortness of breath.   Cardiovascular: Negative for chest pain.  Gastrointestinal: Positive for abdominal pain, constipation and nausea. Negative for diarrhea. Vomiting: one  episode 2 days ago.  Genitourinary: Negative for dysuria, vaginal bleeding and vaginal discharge.    Physical Exam Updated Vital Signs BP (!) 122/107   Pulse (!) 107   Temp 98.3 F (36.8 C) (Oral)   Resp 19   Ht 5\' 7"  (1.702 m)   Wt 98 kg   SpO2 99%   BMI 33.83 kg/m   Physical Exam Vitals and nursing note reviewed.  Constitutional:      General: She is not in acute distress.    Appearance: Normal appearance. She is not ill-appearing, toxic-appearing or diaphoretic.  HENT:     Head: Normocephalic.  Eyes:     Conjunctiva/sclera: Conjunctivae normal.  Cardiovascular:     Rate and Rhythm: Normal rate and regular rhythm.     Pulses: Normal pulses.  Pulmonary:     Effort: Pulmonary effort is normal. No respiratory distress.  Abdominal:     Tenderness: There is abdominal tenderness in the right upper quadrant and epigastric area. Positive signs include Murphy's sign. Negative signs include McBurney's sign.  Musculoskeletal:        General: No deformity or signs of injury.     Cervical back: No rigidity.  Skin:    General: Skin is warm and dry.     Coloration: Skin is not jaundiced or pale.  Neurological:     General: No focal deficit present.     Mental Status: She is alert and oriented to person, place, and time.     ED Results / Procedures / Treatments   Labs (all labs ordered are listed, but only abnormal results are displayed) Labs Reviewed  COMPREHENSIVE METABOLIC PANEL - Abnormal; Notable for the following components:      Result Value   Glucose, Bld 138 (*)    All other components within normal limits  CBC - Abnormal; Notable for the following components:   Hemoglobin 11.7 (*)    MCH 24.7 (*)    All other components within normal limits  LIPASE, BLOOD  URINALYSIS, ROUTINE W REFLEX MICROSCOPIC  I-STAT BETA HCG BLOOD, ED ([facility], [facility], [facility] ONLY)    EKG None  Radiology Abdomen Limited RUQ (LIVER/GB)  Result Date: 09/17/2020 CLINICAL DATA:  Epigastric  abdominal pain EXAM: ULTRASOUND ABDOMEN LIMITED RIGHT UPPER QUADRANT COMPARISON:  None. FINDINGS: Gallbladder: No gallstones or wall thickening visualized. No sonographic Murphy sign noted by sonographer. Common bile duct: Diameter: 4-5 mm in proximal diameter Liver: Compact parenchymal echogenicity and echotexture is normal. Hepatic size is within normal limits. Since the prior CT examination of 07/11/2020, a focal solid hypoechoic mass has developed within the left hepatic lobe measuring 2.3 x 1.4 x 1.8 cm. No intrahepatic biliary ductal dilation identified. Portal vein is patent on color Doppler imaging with normal direction of blood flow towards the liver. Other: None. IMPRESSION:  Interval development of a 2.3 cm focal mass within the left hepatic lobe. Dedicated liver mass protocol CT or MRI examination is recommended for further evaluation on a nonemergent basis. Electronically Signed   By: Helyn Numbers MD   On: 09/17/2020 19:35    Procedures Procedures   Medications Ordered in ED Medications - No data to display  ED Course  I have reviewed the triage vital signs and the nursing notes.  Pertinent labs & imaging results that were available during my care of the patient were reviewed by me and considered in my medical decision making (see chart for details).    MDM Rules/Calculators/A&P                          20 year old female with a history of rheumatoid arthritis, hypertension, appendicitis in February, presents concern for epigastric and right upper quadrant abdominal pain.  No lower abdominal pain or pelvic symptoms and have low suspicion for tubo ovarian abscess, ovarian torsion, or PID.  Pregnancy test is negative.   Right upper quadrant ultrasound was ordered to evaluate for signs of cholecystitis or cholelithiasis and shows no evidence of these, but is significant for a 2.3 cm mass for which we recommend close outpatient MRI.  Possible her pain may be secondary to this, but this  is also possible her pain may be secondary to gastritis or peptic ulcer disease.  Given prescription for Protonix and Zofran.  Final Clinical Impression(s) / ED Diagnoses Final diagnoses:  Epigastric pain  Liver mass, left lobe    Rx / DC Orders ED Discharge Orders         Ordered    pantoprazole (PROTONIX) 20 MG tablet  Daily        09/17/20 2011    ondansetron (ZOFRAN) 4 MG tablet  Every 6 hours        09/17/20 2011    dicyclomine (BENTYL) 20 MG tablet  2 times daily        09/17/20 2011           Alvira Monday, MD 09/18/20 1239

## 2020-09-17 NOTE — ED Notes (Signed)
Pt did not want to stay and left . 

## 2020-09-17 NOTE — ED Notes (Signed)
Pt reports to staff that meds given earlier didn't relieve her of pain, PA Molly Maduro made aware.

## 2020-09-18 LAB — URINE CULTURE: Culture: <10000 — AB

## 2020-10-09 IMAGING — US TRANSVAGINAL OB ULTRASOUND
1 series · 15 of 21 positions shown · non-contrast
Comparison: None.

CLINICAL DATA: Pregnant, confirm viability

EXAM:
TRANSVAGINAL OB ULTRASOUND
TECHNIQUE: Transvaginal ultrasound was performed for complete evaluation of the
gestation as well as the maternal uterus, adnexal regions, and
pelvic cul-de-sac.

[Series 1: transvaginal ob ultrasound · 15 of 21 slices shown]
[im 1/21]
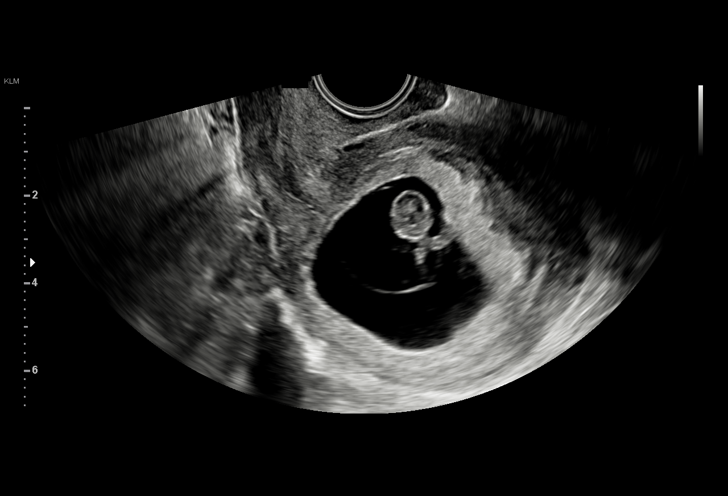
[im 3/21]
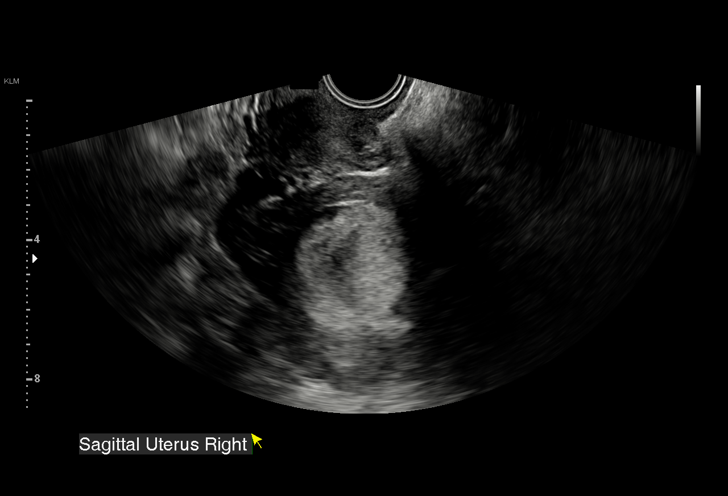
[im 4/21]
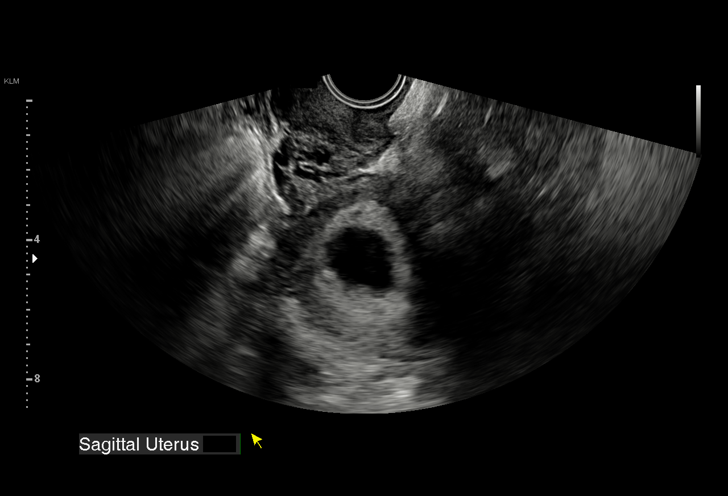
[im 5/21]
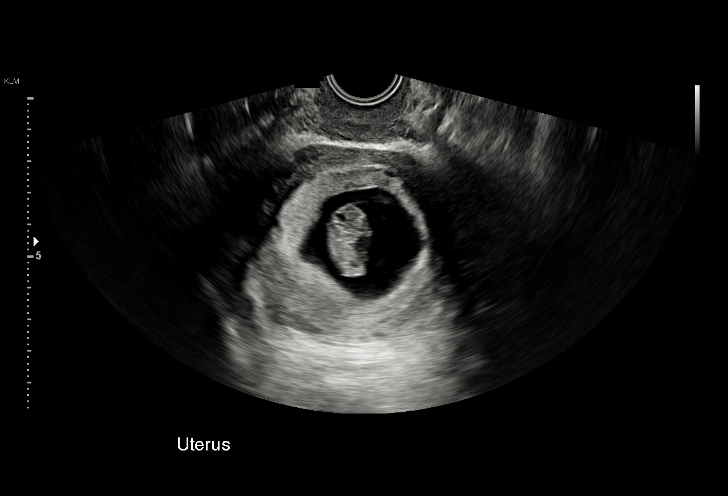
[im 7/21]
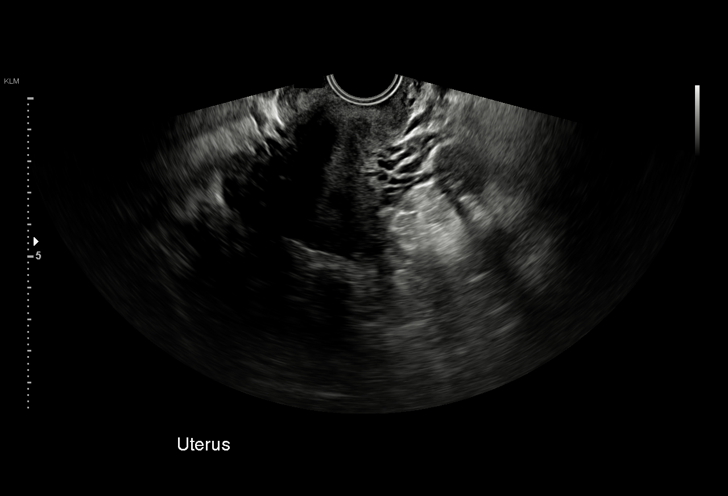
[im 8/21]
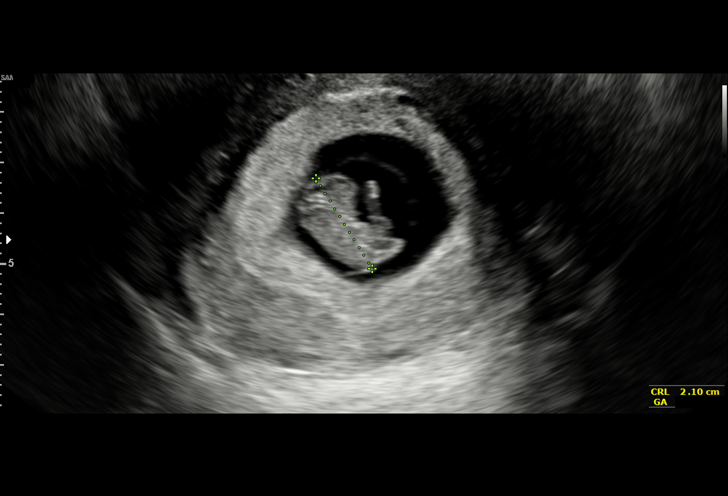
[im 10/21]
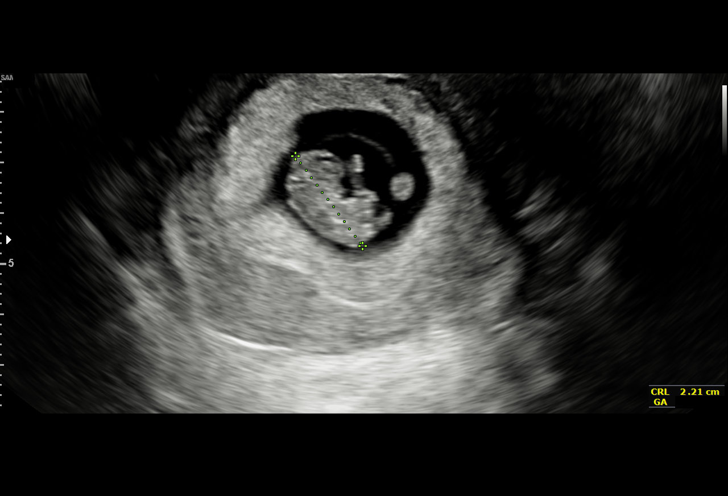
[im 11/21]
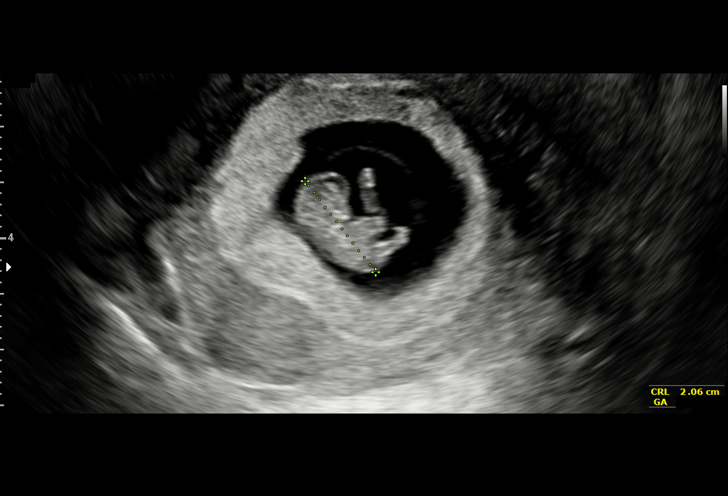
[im 12/21]
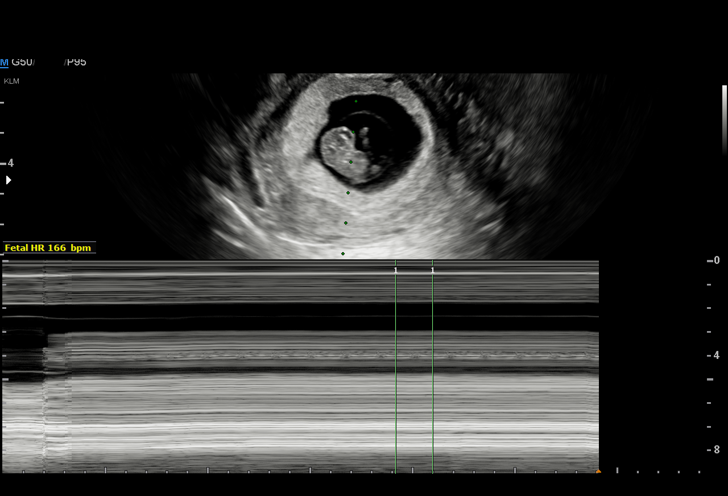
[im 14/21]
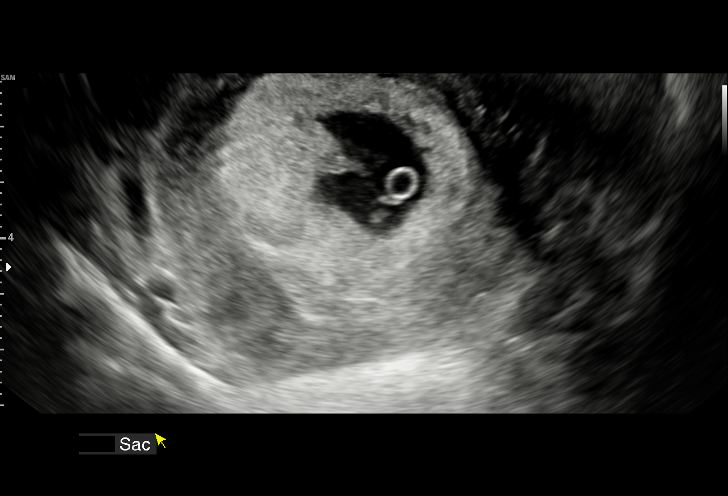
[im 15/21]
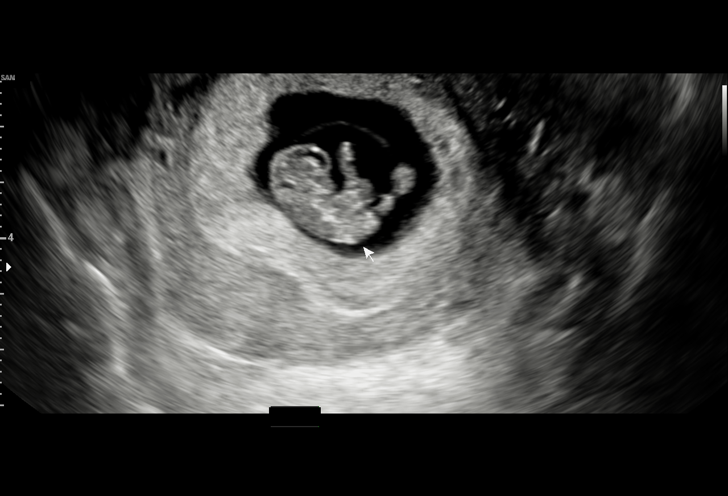
[im 17/21]
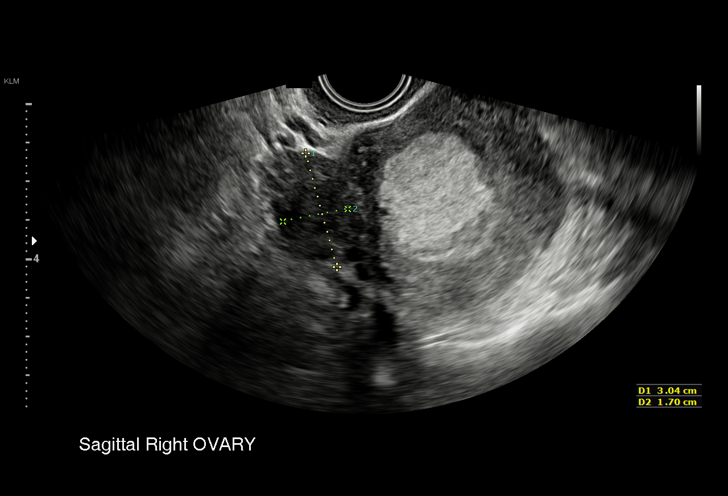
[im 18/21]
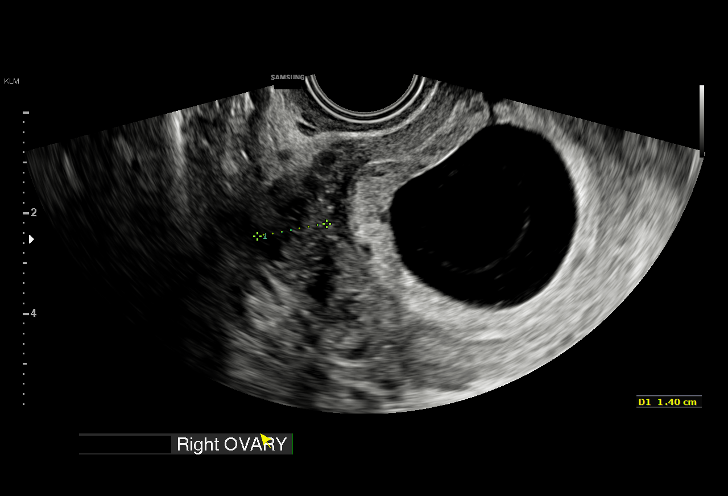
[im 19/21]
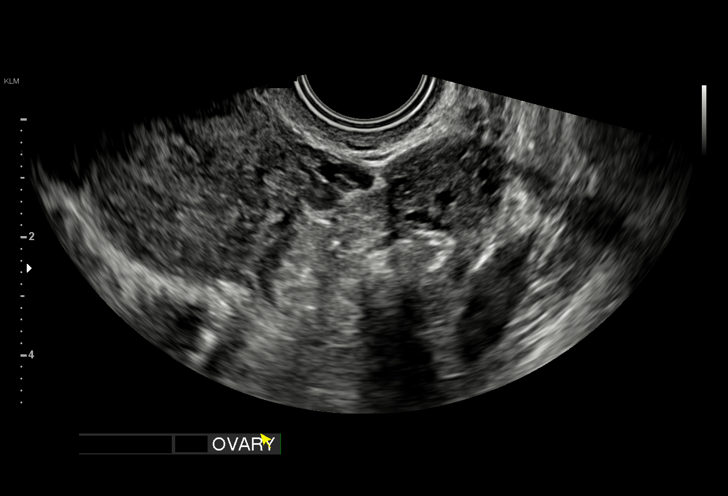
[im 21/21]
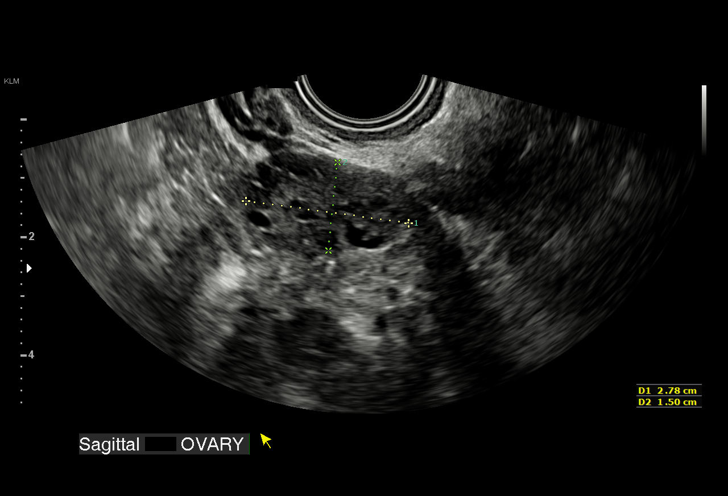

[15 of 21 positions shown; findings below may reference images not displayed]

FINDINGS: Intrauterine gestational sac: Single

Yolk sac:  Visualized.

Embryo:  Visualized.

Cardiac Activity: Visualized.

Heart Rate: 166 bpm

CRL:   20.9 mm   8 w 4 d                  US EDC: 05/28/2019

Subchorionic hemorrhage:  None visualized.

Maternal uterus/adnexae: Bilateral ovaries are within normal limits.

No free fluid.
IMPRESSION: Single live intrauterine gestation, with estimated gestational age 8
weeks 4 days by crown-rump length, as above.

## 2020-10-16 ENCOUNTER — Other Ambulatory Visit: Payer: Self-pay

## 2020-10-16 ENCOUNTER — Emergency Department (HOSPITAL_BASED_OUTPATIENT_CLINIC_OR_DEPARTMENT_OTHER)
Admission: EM | Admit: 2020-10-16 | Discharge: 2020-10-16 | Disposition: A | Discharge status: Home / Self Care | Payer: Medicaid Other | Attending: Emergency Medicine | Admitting: Emergency Medicine

## 2020-10-16 ENCOUNTER — Emergency Department (HOSPITAL_BASED_OUTPATIENT_CLINIC_OR_DEPARTMENT_OTHER): Payer: Medicaid Other

## 2020-10-16 ENCOUNTER — Encounter (HOSPITAL_BASED_OUTPATIENT_CLINIC_OR_DEPARTMENT_OTHER): Payer: Self-pay | Admitting: Obstetrics and Gynecology

## 2020-10-16 DIAGNOSIS — I10 Essential (primary) hypertension: Secondary | ICD-10-CM | POA: Diagnosis not present

## 2020-10-16 DIAGNOSIS — R42 Dizziness and giddiness: Secondary | ICD-10-CM | POA: Diagnosis present

## 2020-10-16 DIAGNOSIS — R519 Headache, unspecified: Secondary | ICD-10-CM | POA: Diagnosis not present

## 2020-10-16 LAB — URINALYSIS, ROUTINE W REFLEX MICROSCOPIC
Bilirubin Urine: NEGATIVE
Glucose, UA: NEGATIVE mg/dL
Hgb urine dipstick: NEGATIVE
Ketones, ur: NEGATIVE mg/dL
Leukocytes,Ua: NEGATIVE
Nitrite: NEGATIVE
Protein, ur: NEGATIVE mg/dL
Specific Gravity, Urine: 1.022 (ref 1.005–1.030)
pH: 6 (ref 5.0–8.0)

## 2020-10-16 LAB — COMPREHENSIVE METABOLIC PANEL
ALT: 29 U/L (ref 0–44)
AST: 25 U/L (ref 15–41)
Albumin: 4 g/dL (ref 3.5–5.0)
Alkaline Phosphatase: 110 U/L (ref 38–126)
Anion gap: 7 (ref 5–15)
BUN: 21 mg/dL — ABNORMAL HIGH (ref 6–20)
CO2: 28 mmol/L (ref 22–32)
Calcium: 9.4 mg/dL (ref 8.9–10.3)
Chloride: 106 mmol/L (ref 98–111)
Creatinine, Ser: 0.76 mg/dL (ref 0.44–1.00)
GFR, Estimated: >60 mL/min (ref >60)
Glucose, Bld: 103 mg/dL — ABNORMAL HIGH (ref 70–99)
Potassium: 4.2 mmol/L (ref 3.5–5.1)
Sodium: 141 mmol/L (ref 135–145)
Total Bilirubin: 0.4 mg/dL (ref 0.3–1.2)
Total Protein: 7.3 g/dL (ref 6.5–8.1)

## 2020-10-16 LAB — CBC WITH DIFFERENTIAL/PLATELET
Abs Immature Granulocytes: 0.01 10*3/uL (ref 0.00–0.07)
Basophils Absolute: 0 10*3/uL (ref 0.0–0.1)
Basophils Relative: 0 %
Eosinophils Absolute: 0.1 10*3/uL (ref 0.0–0.5)
Eosinophils Relative: 2 %
HCT: 40.7 % (ref 36.0–46.0)
Hemoglobin: 12.1 g/dL (ref 12.0–15.0)
Immature Granulocytes: 0 %
Lymphocytes Relative: 32 %
Lymphs Abs: 1.8 10*3/uL (ref 0.7–4.0)
MCH: 24.4 pg — ABNORMAL LOW (ref 26.0–34.0)
MCHC: 29.7 g/dL — ABNORMAL LOW (ref 30.0–36.0)
MCV: 82.2 fL (ref 80.0–100.0)
Monocytes Absolute: 0.4 10*3/uL (ref 0.1–1.0)
Monocytes Relative: 7 %
Neutro Abs: 3.5 10*3/uL (ref 1.7–7.7)
Neutrophils Relative %: 59 %
Platelets: 284 10*3/uL (ref 150–400)
RBC: 4.95 MIL/uL (ref 3.87–5.11)
RDW: 14.6 % (ref 11.5–15.5)
WBC: 5.8 10*3/uL (ref 4.0–10.5)
nRBC: 0 % (ref 0.0–0.2)

## 2020-10-16 LAB — PREGNANCY, URINE: Preg Test, Ur: NEGATIVE

## 2020-10-16 MED ORDER — MECLIZINE HCL 25 MG PO TABS: 25.0000 mg | ORAL_TABLET | Freq: Three times a day (TID) | ORAL | 0 refills | Status: DC | PRN

## 2020-10-16 MED ORDER — SODIUM CHLORIDE 0.9 % IV BOLUS
1000.0000 mL | Freq: Once | INTRAVENOUS | Status: AC
  Administered 2020-10-16: 1000 mL via INTRAVENOUS

## 2020-10-16 NOTE — ED Notes (Signed)
Ambulatory to bathroom with assistance. Gait slightly unsteady secondary to dizziness.

## 2020-10-16 NOTE — ED Provider Notes (Signed)
[facility] EMERGENCY DEPT Provider Note   CSN: [id number] Arrival date & time: 10/16/20  1125     History Chief Complaint  Patient presents with   Dizziness    Bonnie Booth is a 20 y.o. female.  20 yo F with a chief complaint of dizziness and headache.  This been going on for about a week now.  She saw her family doctor yesterday who gave her a headache cocktail and has had improvement of her headache but has had some persistent dizziness.  She describes the dizziness as a lightheaded feeling when she tries to stand up feels like she may pass out.  Denies one-sided numbness or weakness denies difficulty with speech or swallowing.  She has not really been eating and drinking for the past week secondary to the headache.  Denies dark stool or blood in her stool.  Denies significant vaginal bleeding.  The history is provided by the patient.  Dizziness Quality:  Imbalance and lightheadedness Severity:  Moderate Onset quality:  Gradual Duration:  1 week Timing:  Constant Progression:  Unchanged Chronicity:  New Context: standing up   Relieved by:  Being still and lying down Worsened by:  Nothing Ineffective treatments:  None tried Associated symptoms: headaches   Associated symptoms: no chest pain, no nausea, no palpitations, no shortness of breath and no vomiting       Past Medical History:  Diagnosis Date   Anemia    Hypertension    Rheumatoid arthritis (HCC)     Patient Active Problem List   Diagnosis Date Noted   Appendicitis 06/20/2020   Acute appendicitis 06/20/2020   IUD (intrauterine device) in place 05/26/2019   Perineal laceration, second degree 05/20/2019   Normal spontaneous vaginal delivery 05/20/2019   Labor and delivery, indication for care 05/19/2019   Preeclampsia 05/19/2019   Gestational diabetes 03/16/2019   Alpha thalassemia silent carrier 12/02/2018   Supervision of other normal pregnancy, antepartum 11/04/2018    Past Surgical  History:  Procedure Laterality Date   APPENDECTOMY  05/2020   LAPAROSCOPIC APPENDECTOMY N/A 06/21/2020   Procedure: APPENDECTOMY LAPAROSCOPIC;  Surgeon: Quentin Ore, MD;  Location: [facility] ORS;  Service: General;  Laterality: N/A;   NO PAST SURGERIES       OB History     Gravida  3   Para  1   Term  1   Preterm      AB  2   Living  1      SAB  2   IAB      Ectopic      Multiple  0   Live Births  1           No family history on file.  Social History   Tobacco Use   Smoking status: Never   Smokeless tobacco: Never  Vaping Use   Vaping Use: Never used  Substance Use Topics   Alcohol use: No   Drug use: Not Currently    Home Medications Prior to Admission medications   Medication Sig Start Date End Date Taking? Authorizing Provider  meclizine (ANTIVERT) 25 MG tablet Take 1 tablet (25 mg total) by mouth 3 (three) times daily as needed for dizziness. 10/16/20  Yes Melene Plan, DO  acetaminophen (TYLENOL) 500 MG tablet Take 2 tablets (1,000 mg total) by mouth every 6 (six) hours as needed. 06/21/20 06/21/21  Stechschulte, Hyman Hopes, MD  dicyclomine (BENTYL) 20 MG tablet Take 1 tablet (20 mg total) by mouth  2 (two) times daily. 09/17/20   Alvira Monday, MD  levonorgestrel (MIRENA) 20 MCG/24HR IUD 1 each by Intrauterine route once. Jan 2021 placed    [provider]  ondansetron (ZOFRAN ODT) 4 MG disintegrating tablet Take 1 tablet (4 mg total) by mouth every 8 (eight) hours as needed for nausea or vomiting. 09/16/20   Raspet, Denny Peon K, PA-C  ondansetron (ZOFRAN) 4 MG tablet Take 1 tablet (4 mg total) by mouth every 6 (six) hours. 09/17/20   Alvira Monday, MD  oxyCODONE-acetaminophen (PERCOCET) 5-325 MG tablet Take 1 tablet by mouth every 4 (four) hours as needed for severe pain. 06/21/20 06/21/21  Stechschulte, Hyman Hopes, MD  pantoprazole (PROTONIX) 20 MG tablet Take 2 tablets (40 mg total) by mouth daily for 14 days. 09/17/20 10/01/20  Alvira Monday, MD   enalapril (VASOTEC) 5 MG tablet Take 1 tablet (5 mg total) by mouth daily. Patient not taking: Reported on 03/05/2020 05/21/19 03/05/20  Shirlean Mylar, MD  ferrous sulfate 325 (65 FE) MG tablet Take 1 tablet (325 mg total) by mouth every other day. Patient not taking: Reported on 03/05/2020 05/22/19 03/05/20  Shirlean Mylar, MD    Allergies    Apple, Bee venom, Cinnamon, Mango flavor [flavoring agent], Pineapple, Prunus persica, Tomato, and Peach flavor  Review of Systems   Review of Systems  Constitutional:  Negative for chills and fever.  HENT:  Negative for congestion and rhinorrhea.   Eyes:  Negative for redness and visual disturbance.  Respiratory:  Negative for shortness of breath and wheezing.   Cardiovascular:  Negative for chest pain and palpitations.  Gastrointestinal:  Negative for nausea and vomiting.  Genitourinary:  Negative for dysuria and urgency.  Musculoskeletal:  Negative for arthralgias and myalgias.  Skin:  Negative for pallor and wound.  Neurological:  Positive for dizziness and headaches.   Physical Exam Updated Vital Signs BP 112/67   Pulse 65   Temp 98.9 F (37.2 C) (Oral)   Resp 14   Ht 5\' 5"  (1.651 m)   Wt 95.7 kg   SpO2 100%   BMI 35.11 kg/m   Physical Exam Vitals and nursing note reviewed.  Constitutional:      General: She is not in acute distress.    Appearance: She is well-developed. She is not diaphoretic.  HENT:     Head: Normocephalic and atraumatic.  Eyes:     Pupils: Pupils are equal, round, and reactive to light.  Cardiovascular:     Rate and Rhythm: Normal rate and regular rhythm.     Heart sounds: No murmur heard.   No friction rub. No gallop.  Pulmonary:     Effort: Pulmonary effort is normal.     Breath sounds: No wheezing or rales.  Abdominal:     General: There is no distension.     Palpations: Abdomen is soft.     Tenderness: There is no abdominal tenderness.  Musculoskeletal:        General: No tenderness.      Cervical back: Normal range of motion and neck supple.  Skin:    General: Skin is warm and dry.  Neurological:     Mental Status: She is alert and oriented to person, place, and time.     Cranial Nerves: Cranial nerves are intact.     Sensory: Sensation is intact.     Motor: Motor function is intact.     Coordination: Coordination is intact.     Comments: Slightly ataxic gait.  Psychiatric:  Behavior: Behavior normal.    ED Results / Procedures / Treatments   Labs (all labs ordered are listed, but only abnormal results are displayed) Labs Reviewed  CBC WITH DIFFERENTIAL/PLATELET - Abnormal; Notable for the following components:      Result Value   MCH 24.4 (*)    MCHC 29.7 (*)    All other components within normal limits  COMPREHENSIVE METABOLIC PANEL - Abnormal; Notable for the following components:   Glucose, Bld 103 (*)    BUN 21 (*)    All other components within normal limits  PREGNANCY, URINE  URINALYSIS, ROUTINE W REFLEX MICROSCOPIC    EKG None  Radiology CT Head Wo Contrast  Result Date: 10/16/2020 CLINICAL DATA:  Headaches and dizziness for 5 days worsening since yesterday, dizziness on standing or moving EXAM: CT HEAD WITHOUT CONTRAST TECHNIQUE: Contiguous axial images were obtained from the base of the skull through the vertex without intravenous contrast. Sagittal and coronal MPR images reconstructed from axial data set. COMPARISON:  None FINDINGS: Brain: Normal ventricular morphology. No midline shift or mass effect. Normal appearance of brain parenchyma. No intracranial hemorrhage, mass lesion, evidence of acute infarction, or extra-axial fluid collection. Vascular: No hyperdense vessels Skull: Intact Sinuses/Orbits: Clear Other: N/A IMPRESSION: Normal exam. Electronically Signed   By: Ulyses Southward M.D.   On: 10/16/2020 12:37    Procedures Procedures   Medications Ordered in ED Medications  sodium chloride 0.9 % bolus 1,000 mL (1,000 mLs Intravenous New  Bag/Given 10/16/20 1215)    ED Course  I have reviewed the triage vital signs and the nursing notes.  Pertinent labs & imaging results that were available during my care of the patient were reviewed by me and considered in my medical decision making (see chart for details).    MDM Rules/Calculators/A&P                          19 yo F with a chief complaints of dizziness.  This been going on for about a week now.  Dizzy with a headache.  Saw her family doctor yesterday and had a headache cocktail with improvement of her headache.  By history the dizziness sounds most like hypovolemia or near syncope.  She tells me that she is not really been eating and drinking since the headache onset.  We will give a bolus of IV fluids.  Basic blood work.  Pregnancy test.  Patient is concerned and would like a head CT.  Not having a headache currently.  She did recently have imaging of her liver which was concerning for a liver mass.  It was reasonable to obtain a CT of the head to evaluate.  Blood work is resulted without significant anemia.  No significant electrolyte abnormality.  Patient feeling better on reassessment.  CT of the head is negative.  Will discharge home.  PCP follow-up.  1:15 PM:  I have discussed the diagnosis/risks/treatment options with the patient and believe the pt to be eligible for discharge home to follow-up with PCP. We also discussed returning to the ED immediately if new or worsening sx occur. We discussed the sx which are most concerning (e.g., sudden worsening pain, fever, inability to tolerate by mouth) that necessitate immediate return. Medications administered to the patient during their visit and any new prescriptions provided to the patient are listed below.  Medications given during this visit Medications  sodium chloride 0.9 % bolus 1,000 mL (1,000 mLs Intravenous New  Bag/Given 10/16/20 1215)     The patient appears reasonably screen and/or stabilized for discharge and  I doubt any other medical condition or other [facility] requiring further screening, evaluation, or treatment in the ED at this time prior to discharge.   Final Clinical Impression(s) / ED Diagnoses Final diagnoses:  Dizziness    Rx / DC Orders ED Discharge Orders          Ordered    meclizine (ANTIVERT) 25 MG tablet  3 times daily PRN        10/16/20 1313             Melene Plan, DO 10/16/20 1315

## 2020-10-16 NOTE — ED Triage Notes (Signed)
Patient coming by EMS for dizziness when standing and moving. Patient has been having headaches and dizziness x5 days including today and worsening dizziness after seeing PCP yesterday.

## 2020-10-16 NOTE — Discharge Instructions (Addendum)
Return for worsening symptoms.  Follow up with your family doc.   Eat and drink as well as you can for the next couple days.

## 2020-11-22 ENCOUNTER — Ambulatory Visit: Payer: Medicaid Other | Admitting: Internal Medicine

## 2020-11-22 ENCOUNTER — Other Ambulatory Visit: Payer: Self-pay

## 2020-11-22 ENCOUNTER — Ambulatory Visit: Payer: Self-pay

## 2020-11-22 ENCOUNTER — Encounter: Payer: Self-pay | Admitting: Internal Medicine

## 2020-11-22 VITALS — BP 121/71 | HR 92 | Ht 66.0 in | Wt 217.6 lb

## 2020-11-22 DIAGNOSIS — M1380 Other specified arthritis, unspecified site: Secondary | ICD-10-CM | POA: Insufficient documentation

## 2020-11-22 DIAGNOSIS — R768 Other specified abnormal immunological findings in serum: Secondary | ICD-10-CM

## 2020-11-22 DIAGNOSIS — M25572 Pain in left ankle and joints of left foot: Secondary | ICD-10-CM

## 2020-11-22 DIAGNOSIS — M25571 Pain in right ankle and joints of right foot: Secondary | ICD-10-CM | POA: Diagnosis not present

## 2020-11-22 DIAGNOSIS — M0840 Pauciarticular juvenile rheumatoid arthritis, unspecified site: Secondary | ICD-10-CM | POA: Diagnosis not present

## 2020-11-22 DIAGNOSIS — M1990 Unspecified osteoarthritis, unspecified site: Secondary | ICD-10-CM

## 2020-11-22 HISTORY — DX: Other specified arthritis, unspecified site: M13.80

## 2020-11-22 MED ORDER — IBUPROFEN 800 MG PO TABS: 800.0000 mg | ORAL_TABLET | Freq: Three times a day (TID) | ORAL | 0 refills | Status: DC | PRN

## 2020-11-22 NOTE — Patient Instructions (Signed)
Antinuclear Antibody Test Why am I having this test? This is a test that is used to help diagnose systemic lupus erythematosus (SLE) and other autoimmune diseases. An autoimmune disease is a disease in which the body's own defense (immune)system attacks its organs. What is being tested? This test checks for antinuclear antibodies (ANA) in the blood. The presence of ANA is associated with several autoimmune diseases. It is seen in almost allpatients with lupus. What kind of sample is taken?  A blood sample is required for this test. It is usually collected by insertinga needle into a blood vessel. How are the results reported? Your test results will be reported as either positive or negative. A false-positive result can occur. A false positive is incorrect because itmeans that a condition is present when it is not. What do the results mean? A positive test result may mean that you have: Lupus. Other autoimmune diseases, such as rheumatoid arthritis, scleroderma, or Sjgren syndrome. Conditions that may cause a false-positive result include: Liver dysfunction. Myasthenia gravis. Infectious mononucleosis. Talk with your health care provider about what your results mean. Questions to ask your health care provider Ask your health care provider, or the department that is doing the test: When will my results be ready? How will I get my results? What are my treatment options? What other tests do I need? What are my next steps? Summary This is a test that is used to help diagnose systemic lupus erythematosus (SLE) and other autoimmune diseases. An autoimmune disease is a disease in which the body's own defense (immune)system attacks the body. This test checks for antinuclear antibodies (ANA) in the blood. The presence of ANA is associated with several autoimmune diseases. It is seen in almost all patients with lupus. Your test results will be reported as either positive or negative. Talk with  your health care provider about what your results mean.  Erythrocyte Sedimentation Rate Test Why am I having this test? The erythrocyte sedimentation rate (ESR) test is used to help find illnesses related to: Sudden (acute) or long-term (chronic) infections. Inflammation. The body's disease-fighting system attacking healthy cells (autoimmune diseases). Cancer. Tissue death. If you have symptoms that may be related to any of these illnesses, your health care provider may do an ESR test before doing more specific tests. If you have an inflammatory immune disease, such as rheumatoid arthritis, you may have thistest to help monitor your therapy. What is being tested? This test measures how long it takes for your red blood cells (erythrocytes) to settle in a solution over a certain amount of time (sedimentation rate). When you have an infection or inflammation, your red blood cells clump together and settle faster. The sedimentation rate provides information abouthow much inflammation is present in the body. What kind of sample is taken?  A blood sample is required for this test. It is usually collected by insertinga needle into a blood vessel. How do I prepare for this test? Follow any instructions from your health care provider about changing orstopping your regular medicines. Tell a health care provider about: Any allergies you have. All medicines you are taking, including vitamins, herbs, eye drops, creams, and over-the-counter medicines. Any blood disorders you have. Any surgeries you have had. Any medical conditions you have, such as thyroid or kidney disease. Whether you are pregnant or may be pregnant. How are the results reported? Your results will be reported as a value that measures sedimentation rate in millimeters per hour (mm/hr). Your health care provider  will compare your results to normal ranges that were established after testing a large group of people (reference values).  Reference values may vary among labs and hospitals. For this test, common reference values, which vary by age and gender, are: Newborn: 0-2 mm/hr. Child, up to puberty: 0-10 mm/hr. Female: Under 50 years: 0-20 mm/hr. 50-85 years: 0-30 mm/hr. Over 85 years: 0-42 mm/hr. Female: Under 50 years: 0-15 mm/hr. 50-85 years: 0-20 mm/hr. Over 85 years: 0-30 mm/hr. Certain conditions or medicines may cause ESR levels to be falsely lower or higher, such as: Pregnancy. Obesity. Steroids, birth control pills, and blood thinners. Thyroid or kidney disease. What do the results mean? Results that are within reference values are considered normal, meaning that the level of inflammation in your body is healthy. High ESR levels mean that there is inflammation in your body. You will have more tests to help make adiagnosis. Inflammation may result from many different conditions or injuries. Talk with your health care provider about what your results mean. Questions to ask your health care provider Ask your health care provider, or the department that is doing the test: When will my results be ready? How will I get my results? What are my treatment options? What other tests do I need? What are my next steps? Summary The erythrocyte sedimentation rate (ESR) test is used to help find illnesses associated with sudden (acute) or long-term (chronic) infections, inflammation, autoimmune diseases, cancer, or tissue death. If you have symptoms that may be related to any of these illnesses, your health care provider may do an ESR test before doing more specific tests. If you have an inflammatory immune disease, such as rheumatoid arthritis, you may have this test to help monitor your therapy. This test measures how long it takes for your red blood cells (erythrocytes) to settle in a solution over a certain amount of time (sedimentation rate). This provides information about how much inflammation is present in the  body.   Anti-DNA Antibody Test Why am I having this test? The anti-DNA antibody test helps with the diagnosis and follow-up of systemic lupus erythematosus (SLE). It is also used to monitor treatment of thiscondition as the antibody decreases with successful therapy. What is being tested? This test measures the amount of anti-DNA antibody in the blood. This antibody is found in 65-80% of patients with active SLE. This antibody is not as commonin patients who have other diseases. What kind of sample is taken?  A blood sample is required for this test. It is usually collected by insertinga needle into a blood vessel. How are the results reported? Your test results will be reported as a value. Your test results may also be reported as positive, intermediate, or negative. Your health care provider will compare your results to normal ranges that were established after testing a large group of people (reference values). Reference values may vary among labs and hospitals. For this test, common reference values are: Positive: 10 or more international units/mL. Intermediate: 5-9 international units/mL. Negative: Less than 5 international units/mL. What do the results mean? Positive results, which are associated with results that are higher than the reference values, may indicate: Autoimmune disorders such as SLE. Infectious mononucleosis. Chronic liver conditions. Intermediate results mean that the anti-DNA antibody levels are higher thannormal, but not high enough to be considered positive. Negative results mean that you do not have the anti-DNA antibody that isassociated with these conditions. Talk with your health care provider about what your results mean. Questions  to ask your health care provider Ask your health care provider, or the department that is doing the test: When will my results be ready? How will I get my results? What are my treatment options? What other tests do I need? What  are my next steps? Summary The anti-DNA antibody test helps with the diagnosis and follow-up of systemic lupus erythematosus (SLE). It is also used to monitor treatment of this condition as the antibody decreases with successful therapy. This test measures the amount of anti-DNA antibody in the blood. Elevated levels of anti-DNA antibody can be seen in patients with SLE and certain other conditions.  Complement Assay Test Why am I having this test? Complement refers to a group of proteins that are part of the body's disease-fighting system (immune system). A complement assay test provides information about some or all of these proteins. You may have this test: To diagnose a lack, or deficiency, of certain complement proteins. Deficiencies can be passed from parent to child (inherited). To monitor an infection or autoimmune disease. If you have unexplained inflammation or swelling (edema). If you have bacterial infections again and again. What is being tested? This test can be used to measure: Total complement. This is the total number of protein complements in your blood. The number of each kind of complement in your blood. The nine main kinds of complement are labeled C1 through C9. Some of these complements, such as C3 and C4, are especially important and have many functions in the body. Depending on why you are having the test, your health care provider may test your total complement or only some individual complements, such as C3 and C4. The total complement assay test may be done before individual complements aretested. What kind of sample is taken?  A blood sample is required for this test. It is usually collected by insertinga needle into a blood vessel. Tell a health care provider about: Any allergies you have. All medicines you are taking, including vitamins, herbs, eye drops, creams, and over-the-counter medicines. Any blood disorders you have. Any surgeries you have had. Any  medical conditions you have. Whether you are pregnant or may be pregnant. How are the results reported? Your results will be reported as a value that tells you how much complement is in your blood. This will be given as units per milliliter of blood (units/mL) or as milligrams per deciliter of blood (mg/dL). Your results may be reportedas total complement, or as individual complements, or both. Your health care provider will compare your results to normal ranges that were established after testing a large group of people (reference ranges). Reference ranges may vary among labs and hospitals. For this test, reference ranges for some of the most commonly measured complement assays may be: Total complement: 30-75 units/mL. C2: 1-4 mg/dL. C3: 75-175 mg/dL. C4: 22-45 units/mL. What do the results mean? Results within reference ranges are considered normal, which means you have anormal amount of complement in your blood. Results that are higher than the reference ranges may be caused by: Inflammatory disease. Heart attack. Cancer. Complement deficiencies, or results lower than the reference ranges, may be caused by: Certain inherited conditions. Autoimmune disease. Certain liver diseases. Malnutrition. Certain types of anemia that result in breakdown of red blood cells (hemolytic anemia). Talk with your health care provider about what your results mean. Questions to ask your health care provider Ask your health care provider, or the department that is doing the test: When will my results be ready?  How will I get my results? What are my treatment options? What other tests do I need? What are my next steps? Summary Complement refers to a group of proteins that are part of the body's disease-fighting system (immune system). A complement assay test can provide information about some or all of these proteins. You may have a complement assay test to help diagnose a complement deficiency, and to  monitor some infections or autoimmune disease. Talk with your health care provider about what your results mean.  Urinalysis Test Why am I having this test? You may have a urinalysis (UA) test: As part of routine wellness screening. Before surgery. During pregnancy. You may also need to have this test if you have: Kidney disease. Symptoms of a urinary tract infection (UTI). Diabetes. A condition that causes an imbalance in your hormones. What is being tested? A urinalysis is a series of tests done on a sample of your urine. Your kidneys filter your blood to make urine. They get rid of waste products and save the important parts of your blood, such as proteins and minerals (electrolytes). You may need more testing if your UA shows too much: Protein. Sugar (glucose). Blood cells. Bacteria. What kind of sample is taken? A urine sample is required for this test. How do I collect samples at home? You usually collect a urine sample by urinating into a clean cup. You may be asked to collect a urine sample first thing in the morning. When collecting a urine sample at home, make sure you: Use supplies and instructions that you received from the lab. Collect urine only in the germ-free (sterile) cup that you received from the lab. Do not let any toilet paper or stool (feces) get into the cup. Refrigerate the sample until you can return it to the lab. Return the sample or samples to the lab as told. Tell a health care provider about: All medicines you are taking, including vitamins, herbs, eye drops, creams, and over-the-counter medicines. Any medical conditions you have. Whether you are pregnant or may be pregnant. What happens during the test? UA is divided into three parts: A visual exam of your urine sample to check for color or cloudiness. A dipstick test to check for: Proteins. Concentration (specific gravity). Acidity (pH). Glucose. Ketones. These are by-products of your body  burning fat for energy instead of sugar. A waste product from red blood cells (bilirubin). A product of white blood cells (leukocyte esterase). A product of bacteria (nitrite). Blood. A microscopic exam to check for: Red blood cells. White blood cells. Tube-shaped proteins (hyaline casts). Crystal structures. Bacteria. Epithelial cells. These are cells that line your urinary tract. Yeast. How are the results reported? Some of your test results will be reported as values. Your health care provider will compare your results to normal ranges that were established after testing a large group of people (reference ranges). Reference ranges may vary among different labs and hospitals. For this test, common reference ranges are: pH: 4.6-8.0 (average, 6.0). Protein: 0-8 mg/dL. 50-80 mg/24 hr (at rest). Less than 250 mg/24 hr (during exercise). Specific gravity: Adult: 1.005-1.030 (usually, 1.010-1.025). Elderly: values decrease with age. Newborn: 1.001-1.020. Nitrites: none. Ketones: none. Bilirubin: none. Urobilinogen: 9.37-1 Ehrlich unit/mL. Crystals: none. Casts: none. Glucose: Fresh specimen: none. 24-hour specimen: 50-300 mg/24 hr or 0.3-1.7 mmol/day (SI units). White blood cells (WBCs): 0-4 per low-power field. WBC casts: none. Red blood cells (RBCs): Less than or equal to 2. RBC casts: none. Epithelial cells: Few or  0-4 per low-power field. Bacteria: none. Yeast: none. Other results may be reported based on the appearance and odor of the sample. For this test, normal results are: Appearance: clear. Color: amber yellow. Odor: aromatic. Still other results may be reported as positive or negative. For this test, normal results are: Negative for leukocyte esterase. What do the results mean? Many conditions can cause abnormal UA results: Cloudy urine may be a sign of a UTI. Acetone odor may indicate a buildup of blood acids in people who have diabetes (diabetic  ketoacidosis). Fecal odor can indicate an abnormal connection (fistula) between the intestine and the bladder. Ammonia odor can occur after a person holds urine in the bladder for too long. Pungent odor may indicate a UTI. Blood in the urine may be a sign of kidney disease, UTI, or other conditions. White blood cells may be a sign of a UTI. Crystals may be a sign of a kidney stone or other kidney disease. High pH may mean you have a kidney stone, UTI, or kidney disease. Protein may be a sign of kidney disease, high blood pressure in pregnancy (toxemia), or other conditions. Glucose may be a sign of diabetes. Urobilinogen may be a sign of liver disease. Leukocyte esterase may indicate a UTI. Nitrites may indicate a UTI. Talk with your health care provider about what your results mean. Questions to ask your health care provider Ask your health care provider, or the department that is doing the test: When will my results be ready? How will I get my results? What are my treatment options? What other tests do I need? What are my next steps? Summary A urinalysis (UA) is a series of tests done on a sample of your urine. The test may be ordered as part of a routine exam, during pregnancy, before surgery, or if you have certain symptoms. The urinalysis is divided into three parts: a visual exam, a dipstick test, and a microscopic exam. Your health care provider will compare your results to normal ranges that were established after testing a large group of people (reference ranges). Talk with your health care provider about what your results mean. This information is not intended to replace advice given to you by your health care provider. Make sure you discuss any questions you have with your healthcare provider. Document Revised: 11/25/2019 Document Reviewed: 11/25/2019 [employer] Patient Education  [place] [employer].

## 2020-11-22 NOTE — Progress Notes (Signed)
Office Visit Note  Patient: Bonnie Booth             Date of Birth: [birth year]           MRN: [id number]             PCP: Berkley Harvey, NP Referring: Berkley Harvey, NP Visit Date: 11/22/2020  Subjective:   History of Present Illness: Bonnie Booth is a 20 y.o. female here for history of JRA. She was originally diagnosed in [place] in 2009 after development of ankle swelling and left 3rd finger swelling and blistering skin rash. Serology at that time included ANA 1:320 and elevated ASO with negative RF and CCP. She was treated for streptococcal infection but no cardiac involvement of rheumatic fever identified.  She has never been on long-term arthritis treatment outside of chronic use of oral NSAIDs for joint pain in multiple areas.  Currently she reports joint pain in multiple areas most severe in the ankles where she frequently experiences swelling.  Other joint pain involves her low back and in the proximal finger joints and some pain in the elbows.  She does not typically notice swelling in her upper extremities besides one persistent nodule on the right thumb.  She denies any problems of alopecia, oral ulcers, lymphadenopathy, skin rashes, Raynaud's symptoms, and has no history of blood clots. She has a healthy 56-year-old son she did experience preeclampsia and gestational diabetes.  Labs reviewed 08/28/20 CBC Hgb 11.7 MCV 76.9 Ferritin 16.3 Vit D 16  6/12//09 EKG left axis deviation TTE normal  Activities of Daily Living:  Patient reports morning stiffness for 1 hour.   Patient Reports nocturnal pain.  Difficulty dressing/grooming: Denies Difficulty climbing stairs: Reports Difficulty getting out of chair: Reports Difficulty using hands for taps, buttons, cutlery, and/or writing: Reports  Review of Systems  Constitutional:  Negative for fatigue.  HENT:  Negative for mouth sores, mouth dryness and nose dryness.   Eyes:  Negative for pain, itching and dryness.   Respiratory:  Negative for shortness of breath and difficulty breathing.   Cardiovascular:  Negative for chest pain and palpitations.  Gastrointestinal:  Positive for constipation. Negative for blood in stool and diarrhea.  Endocrine: Negative for increased urination.  Genitourinary:  Negative for difficulty urinating.  Musculoskeletal:  Positive for joint pain, joint pain, joint swelling, myalgias, morning stiffness, muscle tenderness and myalgias.  Skin:  Negative for color change, rash and redness.  Allergic/Immunologic: Negative for susceptible to infections.  Neurological:  Negative for dizziness, numbness, headaches, memory loss and weakness.  Hematological:  Positive for bruising/bleeding tendency.  Psychiatric/Behavioral:  Negative for confusion.    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[place]) 11/22/2020   Positive ANA (antinuclear antibody) 11/22/2020   Inflammatory arthritis 11/22/2020   Appendicitis 06/20/2020   Acute appendicitis 06/20/2020   IUD (intrauterine device) in place 05/26/2019   Perineal laceration, second degree 05/20/2019   Normal spontaneous vaginal delivery 05/20/2019   Labor and delivery, indication for care 05/19/2019   Preeclampsia 05/19/2019   Gestational diabetes 03/16/2019   Alpha thalassemia silent carrier 12/02/2018   Supervision of other normal pregnancy, antepartum 11/04/2018   Personal history of arthritis 01/17/2013   Panic disorder 01/17/2013   Adverse food reaction 01/17/2013    Past Medical History:  Diagnosis Date   Anemia    Hypertension    Rheumatoid arthritis ([place])     Family  History  Problem Relation Age of Onset   Healthy Son    Past Surgical History:  Procedure Laterality Date   APPENDECTOMY  05/2020   LAPAROSCOPIC APPENDECTOMY N/A 06/21/2020   Procedure: APPENDECTOMY LAPAROSCOPIC;  Surgeon: Stechschulte, Nickola Major, MD;  Location: [facility];  Service: General;  Laterality: N/A;   NO PAST SURGERIES     Social History   Social History Narrative   Not on file   Immunization History  Administered Date(s) Administered   DTaP 06/27/2002, 08/04/2003, 03/12/2004, 03/11/2005   HPV Quadrivalent 01/17/2013   Hepatitis A, Ped/Adol-2 Dose 01/17/2013   Hepatitis B, ped/adol 06/27/2002, 08/04/2003, 03/11/2005   HiB (PRP-OMP) 06/27/2002   IPV 06/27/2002, 08/04/2003, 03/11/2005   Influenza Split 02/02/2019   Influenza,inj,Quad PF,6+ Mos 02/02/2019   MMR 06/27/2002, 03/11/2005   Meningococcal Conjugate 01/17/2013   Meningococcal Polysaccharide 01/17/2013   Pneumococcal Conjugate-13 06/27/2002   Pneumococcal-Unspecified 06/27/2002   Tdap 01/17/2013, 03/15/2019   Varicella 01/17/2013     Objective: Vital Signs: BP 121/71 (BP Location: Left Arm, Patient Position: Sitting, Cuff Size: Normal)   Pulse 92   Ht 5' 6" (1.676 m)   Wt 217 lb 9.6 oz (98.7 kg)   BMI 35.12 kg/m    Physical Exam Constitutional:      Appearance: She is obese.  HENT:     Right Ear: External ear normal.     Left Ear: External ear normal.     Mouth/Throat:     Mouth: Mucous membranes are moist.     Pharynx: Oropharynx is clear.  Eyes:     Conjunctiva/sclera: Conjunctivae normal.  Cardiovascular:     Rate and Rhythm: Normal rate and regular rhythm.  Pulmonary:     Effort: Pulmonary effort is normal.     Breath sounds: Normal breath sounds.  Skin:    General: Skin is warm and dry.     Findings: No rash.  Neurological:     General: No focal deficit present.     Mental Status: She is alert.  Psychiatric:        Mood and Affect: Mood normal.     Musculoskeletal Exam:  Neck full ROM no tenderness Shoulders full ROM no tenderness or swelling Elbows full range of motion bilaterally, right elbow tenderness to palpation Wrists full ROM no tenderness or swelling Fingers tenderness to palpation over numerous MCP and PIP joints without palpable  synovitis, nodule probable cysts present over dorsal aspect of right thumb Bilateral paraspinal muscle tenderness to palpation in the lumbar spine Knees full ROM no tenderness or swelling Ankles full range of motion tenderness to palpation worst anterior of the ankle joint on both sides worse on right, mild effusion versus soft tissue swelling Bilateral plus planus MTPs full ROM no tenderness or swelling   Investigation: No additional findings.  Imaging: XR Ankle 2 Views Left  Result Date: 11/23/2020 X-ray left ankle 2 views Normal tibiotalar joint space and alignment.  Midfoot joint spaces appear normal.  No significant osteophytes or abnormal calcifications.  No visible large joint effusions.  No erosive changes are seen. Impression No significant arthritis changes are seen  XR Ankle 2 Views Right  Result Date: 11/23/2020 X-ray right ankle 2 views Normal tibiotalar joint space and alignment.  Normal midfoot joint spaces.  No significant osteophytes or calcifications.  No visible large joint effusions.  No erosions. Impression No significant arthritis changes are seen   Recent Labs: Lab Results  Component Value Date   WBC 5.8 10/16/2020  HGB 12.1 10/16/2020   PLT 284 10/16/2020   NA 141 10/16/2020   K 4.2 10/16/2020   CL 106 10/16/2020   CO2 28 10/16/2020   GLUCOSE 103 (H) 10/16/2020   BUN 21 (H) 10/16/2020   CREATININE 0.76 10/16/2020   BILITOT 0.4 10/16/2020   ALKPHOS 110 10/16/2020   AST 25 10/16/2020   ALT 29 10/16/2020   PROT 7.3 10/16/2020   ALBUMIN 4.0 10/16/2020   CALCIUM 9.4 10/16/2020   GFRAA >60 05/19/2019    Speciality Comments: No specialty comments available.  Procedures:  No procedures performed Allergies: Apple, Bee venom, Cinnamon, Mango flavor [flavoring agent], Pineapple, Prunus persica, Tomato, and Peach flavor   Assessment / Plan:     Visit Diagnoses: Inflammatory arthritis - Plan: Sedimentation rate  She has joint tenderness and pain in  numerous sites including upper and lower extremities and low back there is no objective synovitis or swelling outside of her ankles.  We will check sedimentation rate for marker of inflammatory activity.  We will increase her oral NSAID dosing for now and see if she experiences a large improvement.  Considering the risks of very long-term NSAID use would consider trial of DMARD if evidence of current inflammation.  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  Symptoms were apparently reasonably controlled with oral NSAIDs alone.  It does not sound like she is ever stopped having joint pains although not as much active joint swelling as had been initially described at diagnosis years ago.  Ankle joints are the worst site of involvement so checking the bilateral x-rays today for any evidence of erosive disease or demineralization.  Positive ANA (antinuclear antibody) - Plan: XR Ankle 2 Views Right, XR Ankle 2 Views Left, RNP Antibody, Anti-DNA antibody, double-stranded, Anti-Smith antibody, Sjogrens syndrome-A extractable nuclear antibody, C3 and C4, Rheumatoid factor, Urinalysis, Routine w reflex microscopic  Positive ANA original identified 13 years ago during work-up for JIA we will check serology today RNP, dsDNA, Smith, SSA, complements for specific markers.  Checking urinalysis screening for proteinuria.  Orders: Orders Placed This Encounter  Procedures   XR Ankle 2 Views Right   XR Ankle 2 Views Left   Sedimentation rate   RNP Antibody   Anti-DNA antibody, double-stranded   Anti-Smith antibody   Sjogrens syndrome-A extractable nuclear antibody   C3 and C4   Rheumatoid factor   Urinalysis, Routine w reflex microscopic    Meds ordered this encounter  Medications   ibuprofen  (ADVIL) 800 MG tablet    Sig: Take 1 tablet (800 mg total) by mouth every 8 (eight) hours as needed.    Dispense:  60 tablet    Refill:  0     Follow-Up Instructions: Return in about 2 weeks (around 12/06/2020) for New pt f/u +ANA h/o JIA f/u 2wks.   Collier Salina, MD  Note - This record has been created using [employer].  Chart creation errors have been sought, but may not always  have been located. Such creation errors do not reflect on  the standard of medical care.

## 2020-11-23 ENCOUNTER — Ambulatory Visit: Payer: Self-pay | Admitting: Nurse Practitioner

## 2020-11-23 LAB — URINALYSIS, ROUTINE W REFLEX MICROSCOPIC
Bilirubin Urine: NEGATIVE
Glucose, UA: NEGATIVE
Hgb urine dipstick: NEGATIVE
Ketones, ur: NEGATIVE
Leukocytes,Ua: NEGATIVE
Nitrite: NEGATIVE
Protein, ur: NEGATIVE
Specific Gravity, Urine: 1.03 (ref 1.001–1.035)
pH: 6 (ref 5.0–8.0)

## 2020-11-23 LAB — ANTI-SMITH ANTIBODY: ENA SM Ab Ser-aCnc: <1 AI

## 2020-11-23 LAB — RHEUMATOID FACTOR: Rheumatoid fact SerPl-aCnc: <14 IU/mL (ref <14)

## 2020-11-23 LAB — RNP ANTIBODY: Ribonucleic Protein(ENA) Antibody, IgG: <1 AI

## 2020-11-23 LAB — ANTI-DNA ANTIBODY, DOUBLE-STRANDED: ds DNA Ab: <1 IU/mL

## 2020-11-23 LAB — SJOGRENS SYNDROME-A EXTRACTABLE NUCLEAR ANTIBODY: SSA (Ro) (ENA) Antibody, IgG: <1 AI

## 2020-11-23 LAB — SEDIMENTATION RATE: Sed Rate: 39 mm/h — ABNORMAL HIGH (ref 0–20)

## 2020-11-23 LAB — C3 AND C4
C3 Complement: 186 mg/dL (ref 83–193)
C4 Complement: 43 mg/dL (ref 15–57)

## 2020-11-23 NOTE — Telephone Encounter (Signed)
Pt saw sed rate results on MyChart and was just wondering what that means. I explained and she stated she will follow up with MD next week.

## 2020-11-23 NOTE — Telephone Encounter (Signed)
Answer Assessment - Initial Assessment Questions 1. ONSET: "When did the pain start?"      gneralized 2. LOCATION: "Where is the pain located?"      ankles 3. PAIN: "How bad is the pain?"    (Scale 1-10; or mild, moderate, severe)  - MILD (1-3): doesn't interfere with normal activities.   - MODERATE (4-7): interferes with normal activities (e.g., work or school) or awakens from sleep, limping.   - SEVERE (8-10): excruciating pain, unable to do any normal activities, unable to walk.      mod 4. WORK OR EXERCISE: "Has there been any recent work or exercise that involved this part of the body?"      general 5. CAUSE: "What do you think is causing the ankle pain?"     arthritis 6. OTHER SYMPTOMS: "Do you have any other symptoms?" (e.g., calf pain, rash, fever, swelling)     swelling 7. PREGNANCY: "Is there any chance you are pregnant?" "When was your last menstrual period?"     na  Protocols used: Ankle Pain-A-AH

## 2020-12-05 NOTE — Progress Notes (Signed)
Office Visit Note  Patient: Bonnie Booth             Date of Birth: 2001-02-05           MRN: [id number]             PCP: Berkley Harvey, NP Referring: Berkley Harvey, NP Visit Date: 12/06/2020   Subjective:  Follow-up (Total body pain)   History of Present Illness: Bonnie Booth is a 20 y.o. female here for follow up with joint pains and positive ANA with history of ANA positive oligoarticular JIA. Since our last visit she still has ongoing pain in multiple areas worst at the ankle where she also observes continued swelling. Lab tests demonstrated an elevated ESR otherwise unremarkable.   Previous HPI: 11/22/20 Bonnie Booth is a 20 y.o. female here for history of JRA. She was originally diagnosed in [place] in 2009 after development of ankle swelling and left 3rd finger swelling and blistering skin rash. Serology at that time included ANA 1:320 and elevated ASO with negative RF and CCP. She was treated for streptococcal infection but no cardiac involvement of rheumatic fever identified.  She has never been on long-term arthritis treatment outside of chronic use of oral NSAIDs for joint pain in multiple areas.  Currently she reports joint pain in multiple areas most severe in the ankles where she frequently experiences swelling.  Other joint pain involves her low back and in the proximal finger joints and some pain in the elbows.  She does not typically notice swelling in her upper extremities besides one persistent nodule on the right thumb.  She denies any problems of alopecia, oral ulcers, lymphadenopathy, skin rashes, Raynaud's symptoms, and has no history of blood clots. She has a healthy 32-year-old son she did experience preeclampsia and gestational diabetes.   Labs reviewed 08/28/20 CBC Hgb 11.7 MCV 76.9 Ferritin 16.3 Vit D 16   6/12//09 EKG left axis deviation TTE normal   Review of Systems  Constitutional:  Negative for fatigue.  HENT:  Negative for mouth dryness.    Eyes:  Negative for dryness.  Respiratory:  Negative for shortness of breath.   Cardiovascular:  Positive for swelling in legs/feet.  Gastrointestinal:  Negative for diarrhea.  Endocrine: Positive for heat intolerance and excessive thirst.  Genitourinary:  Negative for difficulty urinating.  Musculoskeletal:  Positive for joint pain, gait problem, joint pain, joint swelling and muscle weakness.  Skin:  Negative for rash.  Allergic/Immunologic: Negative for susceptible to infections.  Neurological:  Positive for weakness.  Hematological:  Negative for bruising/bleeding tendency.  Psychiatric/Behavioral:  Negative for sleep disturbance.    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   Appendicitis 06/20/2020   Acute appendicitis 06/20/2020   IUD (intrauterine device) in place 05/26/2019   Perineal laceration, second degree 05/20/2019   Normal spontaneous vaginal delivery 05/20/2019   Labor and delivery, indication for care 05/19/2019   Preeclampsia 05/19/2019   Gestational diabetes 03/16/2019   Alpha thalassemia silent carrier 12/02/2018   Supervision of other normal pregnancy, antepartum 11/04/2018   Personal history of arthritis 01/17/2013   Panic disorder 01/17/2013   Adverse food reaction 01/17/2013    Past Medical History:  Diagnosis Date   Anemia    Hypertension    Rheumatoid arthritis ([place])     Family  History  Problem Relation Age of Onset   Healthy Son    Past Surgical History:  Procedure Laterality Date   APPENDECTOMY  05/2020   LAPAROSCOPIC APPENDECTOMY N/A 06/21/2020   Procedure: APPENDECTOMY LAPAROSCOPIC;  Surgeon: Stechschulte, Nickola Major, MD;  Location: [facility];  Service: General;  Laterality: N/A;   NO PAST SURGERIES     Social History   Social History  Narrative   Not on file   Immunization History  Administered Date(s) Administered   DTaP 06/27/2002, 08/04/2003, 03/12/2004, 03/11/2005   HPV Quadrivalent 01/17/2013   Hepatitis A, Ped/Adol-2 Dose 01/17/2013   Hepatitis B, ped/adol 06/27/2002, 08/04/2003, 03/11/2005   HiB (PRP-OMP) 06/27/2002   IPV 06/27/2002, 08/04/2003, 03/11/2005   Influenza Split 02/02/2019   Influenza,inj,Quad PF,6+ Mos 02/02/2019   MMR 06/27/2002, 03/11/2005   Meningococcal Conjugate 01/17/2013   Meningococcal Polysaccharide 01/17/2013   Pneumococcal Conjugate-13 06/27/2002   Pneumococcal-Unspecified 06/27/2002   Tdap 01/17/2013, 03/15/2019   Varicella 01/17/2013     Objective: Vital Signs: BP 120/76 (BP Location: Left Arm, Patient Position: Sitting, Cuff Size: Normal)   Pulse (!) 105   Resp 15   Ht $R'5\' 6"'Fb$  (1.676 m)   Wt 220 lb (99.8 kg)   BMI 35.51 kg/m    Physical Exam Constitutional:      Appearance: She is obese.  Eyes:     Conjunctiva/sclera: Conjunctivae normal.  Skin:    General: Skin is warm and dry.     Findings: No rash.  Neurological:     Mental Status: She is alert.  Psychiatric:        Mood and Affect: Mood normal.     Musculoskeletal Exam:  Wrists full ROM no tenderness or swelling Fingers without palpable synovitis b/l Knees full ROM no tenderness or swelling Ankles full range of motion tenderness to palpation worst anterior of the ankle joint on both sides R>L, mild soft tissue swelling present Bilateral plus planus  Investigation: No additional findings.  Imaging: XR Ankle 2 Views Left  Result Date: 11/23/2020 X-ray left ankle 2 views Normal tibiotalar joint space and alignment.  Midfoot joint spaces appear normal.  No significant osteophytes or abnormal calcifications.  No visible large joint effusions.  No erosive changes are seen. Impression No significant arthritis changes are seen  XR Ankle 2 Views Right  Result Date: 11/23/2020 X-ray right ankle 2 views Normal  tibiotalar joint space and alignment.  Normal midfoot joint spaces.  No significant osteophytes or calcifications.  No visible large joint effusions.  No erosions. Impression No significant arthritis changes are seen   Recent Labs: Lab Results  Component Value Date   WBC 5.8 10/16/2020   HGB 12.1 10/16/2020   PLT 284 10/16/2020   NA 141 10/16/2020   K 4.2 10/16/2020   CL 106 10/16/2020   CO2 28 10/16/2020   GLUCOSE 103 (H) 10/16/2020   BUN 21 (H) 10/16/2020   CREATININE 0.76 10/16/2020   BILITOT 0.4 10/16/2020   ALKPHOS 110 10/16/2020   AST 25 10/16/2020   ALT 29 10/16/2020   PROT 7.3 10/16/2020   ALBUMIN 4.0 10/16/2020   CALCIUM 9.4 10/16/2020   GFRAA >60 05/19/2019    Speciality Comments: No specialty comments available.  Procedures:  No procedures performed Allergies: Apple, Bee venom, Cinnamon, Mango flavor [flavoring agent], Pineapple, Prunus persica, Tomato, and Peach flavor   Assessment / Plan:     Visit Diagnoses: Seronegative inflammatory arthritis - Plan: hydroxychloroquine (PLAQUENIL) 200 MG tablet, ibuprofen (ADVIL) 800 MG tablet, Ambulatory referral  to Ophthalmology  Chronic joint pain with elevated inflammatory markers and mild soft tissue swelling of the bilateral ankles suggestive for some form of seronegative inflammatory arthritis is active.  Symptoms have responded reasonably well to ibuprofen but requiring maximal doses.  Recommend starting hydroxychloroquine 400 mg daily can continue as needed ibuprofen.  She anticipates needing to file disability documentation symptoms are preventing working a normal amount, recommended she contact her PCP office for this and we can provide supporting documents. Discussed risks of medication including need for retinal toxicity screening will refer to ophthalmology today.  JIA (juvenile idiopathic arthritis), oligoarthritis, extended [facility])  Not clear whether current joint involvement is continuation of same process or  different but given the chronicity do recommend trial of starting DMARD besides just long-term NSAID use.  Orders: Orders Placed This Encounter  Procedures   Ambulatory referral to Ophthalmology    Meds ordered this encounter  Medications   hydroxychloroquine (PLAQUENIL) 200 MG tablet    Sig: Take 1 tablet (200 mg total) by mouth daily.    Dispense:  30 tablet    Refill:  2   ibuprofen (ADVIL) 800 MG tablet    Sig: Take 1 tablet (800 mg total) by mouth every 8 (eight) hours as needed.    Dispense:  60 tablet    Refill:  2      Follow-Up Instructions: Return in about 2 months (around 02/05/2021) for Arthitis/JIA NSAID+HCQ start f/u 8 wks.   Collier Salina, MD  Note - This record has been created using [employer].  Chart creation errors have been sought, but may not always  have been located. Such creation errors do not reflect on  the standard of medical care.

## 2020-12-06 ENCOUNTER — Other Ambulatory Visit: Payer: Self-pay

## 2020-12-06 ENCOUNTER — Ambulatory Visit (INDEPENDENT_AMBULATORY_CARE_PROVIDER_SITE_OTHER): Payer: Self-pay | Admitting: Internal Medicine

## 2020-12-06 ENCOUNTER — Encounter: Payer: Self-pay | Admitting: Internal Medicine

## 2020-12-06 VITALS — BP 120/76 | HR 105 | Resp 15 | Ht 66.0 in | Wt 220.0 lb

## 2020-12-06 DIAGNOSIS — M1380 Other specified arthritis, unspecified site: Secondary | ICD-10-CM

## 2020-12-06 DIAGNOSIS — M0840 Pauciarticular juvenile rheumatoid arthritis, unspecified site: Secondary | ICD-10-CM

## 2020-12-06 DIAGNOSIS — R768 Other specified abnormal immunological findings in serum: Secondary | ICD-10-CM

## 2020-12-06 MED ORDER — IBUPROFEN 800 MG PO TABS
800.0000 mg | ORAL_TABLET | Freq: Three times a day (TID) | ORAL | 2 refills | Status: DC | PRN
Start: 2020-12-06 — End: 2021-08-04

## 2020-12-06 MED ORDER — HYDROXYCHLOROQUINE SULFATE 200 MG PO TABS: 200.0000 mg | ORAL_TABLET | Freq: Every day | ORAL | 2 refills | Status: DC

## 2020-12-06 NOTE — Patient Instructions (Signed)
I recommend starting hydroxychloroquine 1 tablet (200 mg) daily by mouth for inflammatory arthritis. This medicine can take a while to effect so continue taking It for a while regularly. For inflammation in the meantime or for any worse episodes you can take ibuprofen along with this up to 3 times daily. Take with food to reduce risk of digestive tract irritation.

## 2021-02-09 ENCOUNTER — Other Ambulatory Visit: Payer: Self-pay

## 2021-02-09 ENCOUNTER — Emergency Department (HOSPITAL_COMMUNITY)
Admission: EM | Admit: 2021-02-09 | Discharge: 2021-02-09 | Disposition: A | Discharge status: Home / Self Care | Payer: Medicaid Other | Attending: Emergency Medicine | Admitting: Emergency Medicine

## 2021-02-09 DIAGNOSIS — Z79899 Other long term (current) drug therapy: Secondary | ICD-10-CM | POA: Diagnosis not present

## 2021-02-09 DIAGNOSIS — I10 Essential (primary) hypertension: Secondary | ICD-10-CM | POA: Insufficient documentation

## 2021-02-09 DIAGNOSIS — R109 Unspecified abdominal pain: Secondary | ICD-10-CM | POA: Diagnosis not present

## 2021-02-09 LAB — URINALYSIS, ROUTINE W REFLEX MICROSCOPIC
Bilirubin Urine: NEGATIVE
Glucose, UA: NEGATIVE mg/dL
Hgb urine dipstick: NEGATIVE
Ketones, ur: 5 mg/dL — AB
Nitrite: NEGATIVE
Protein, ur: NEGATIVE mg/dL
Specific Gravity, Urine: 1.032 — ABNORMAL HIGH (ref 1.005–1.030)
pH: 5 (ref 5.0–8.0)

## 2021-02-09 LAB — PREGNANCY, URINE: Preg Test, Ur: NEGATIVE

## 2021-02-09 MED ORDER — METHOCARBAMOL 500 MG PO TABS: 500.0000 mg | ORAL_TABLET | Freq: Two times a day (BID) | ORAL | 0 refills | Status: DC

## 2021-02-09 MED ORDER — ACETAMINOPHEN 325 MG PO TABS
650.0000 mg | ORAL_TABLET | Freq: Once | ORAL | Status: AC
  Administered 2021-02-09: 650 mg via ORAL
  Filled 2021-02-09: qty 2

## 2021-02-09 MED ORDER — IBUPROFEN 600 MG PO TABS: 600.0000 mg | ORAL_TABLET | Freq: Four times a day (QID) | ORAL | 0 refills | Status: DC | PRN

## 2021-02-09 NOTE — ED Provider Notes (Signed)
Emergency Medicine Provider Triage Evaluation Note  Bonnie Booth , a 20 y.o. female  was evaluated in triage.  Pt complains of mid back pain to the bilat back, worse on the right  Review of Systems  Positive: Dysuria, back pain, vomiting Negative: fever  Physical Exam  BP 117/73 (BP Location: Left Arm)   Pulse (!) 102   Temp 99.5 F (37.5 C) (Oral)   Resp 18   SpO2 95%  Gen:   Awake, no distress   Resp:  Normal effort  MSK:   Moves extremities without difficulty  Other:  Ttp to the thoracic paraspinous muscles and right cva  Medical Decision Making  Medically screening exam initiated at 7:15 PM.  Appropriate orders placed.  [name] was informed that the remainder of the evaluation will be completed by another provider, this initial triage assessment does not replace that evaluation, and the importance of remaining in the ED until their evaluation is complete.     Bonnie Booth 02/09/21 1915    Bonnie Nick, MD 02/09/21 [id number]

## 2021-02-09 NOTE — ED Triage Notes (Signed)
Pt endorses pain to back right below ribs bilaterally  Describes it as burning. No n/v/d  No urinary symptoms.  Pt states this began yesterday

## 2021-02-09 NOTE — ED Provider Notes (Signed)
[facility] Provider Note   CSN: [id number] Arrival date & time: 02/09/21  8366     History Chief Complaint  Patient presents with   Flank Pain    Bonnie Booth is a 20 y.o. female.  20 year old female presents with 3 days of constant right-sided flank pain.  States that occasion does go to her left side.  Denies any urinary symptoms.  No fever, dyspnea, rashes.  Pain does not go down her legs.  It is worse with certain movements.  No treatment use prior to arrival.  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   Appendicitis 06/20/2020   Acute appendicitis 06/20/2020   IUD (intrauterine device) in place 05/26/2019   Perineal laceration, second degree 05/20/2019   Normal spontaneous vaginal delivery 05/20/2019   Labor and delivery, indication for care 05/19/2019   Preeclampsia 05/19/2019   Gestational diabetes 03/16/2019   Alpha thalassemia silent carrier 12/02/2018   Supervision of other normal pregnancy, antepartum 11/04/2018   Personal history of arthritis 01/17/2013   Panic disorder 01/17/2013   Adverse food reaction 01/17/2013    Past Surgical History:  Procedure Laterality Date   APPENDECTOMY  05/2020   LAPAROSCOPIC APPENDECTOMY N/A 06/21/2020   Procedure: APPENDECTOMY LAPAROSCOPIC;  Surgeon: Quentin Ore, MD;  Location: [facility];  Service: General;  Laterality: N/A;   NO PAST SURGERIES       OB History     Gravida  3   Para  1   Term  1   Preterm      AB  2   Living  1      SAB  2   IAB      Ectopic      Multiple  0   Live Births  1           Family History  Problem  Relation Age of Onset   Healthy Son     Social History   Tobacco Use   Smoking status: Never   Smokeless tobacco: Never  Vaping Use   Vaping Use: Never used  Substance Use Topics   Alcohol use: No   Drug use: Not Currently    Home Medications Prior to Admission medications   Medication Sig Start Date End Date Taking? Authorizing Provider  acetaminophen (TYLENOL) 500 MG tablet Take 2 tablets (1,000 mg total) by mouth every 6 (six) hours as needed. Patient not taking: No sig reported 06/21/20 06/21/21  Stechschulte, Hyman Hopes, MD  dicyclomine (BENTYL) 20 MG tablet Take 1 tablet (20 mg total) by mouth 2 (two) times daily. Patient not taking: No sig reported 09/17/20   Alvira Monday, MD  hydroxychloroquine (PLAQUENIL) 200 MG tablet Take 1 tablet (200 mg total) by mouth daily. 12/06/20   Rice, Jamesetta Orleans, MD  ibuprofen (ADVIL) 800 MG tablet Take 1 tablet (800 mg total) by mouth every 8 (eight) hours as needed. 12/06/20   Fuller Plan, MD  levonorgestrel (MIRENA) 20 MCG/24HR IUD 1 each by Intrauterine route once. Jan 2021 placed    [provider]  meclizine (ANTIVERT) 25 MG tablet Take 1 tablet (25 mg total) by mouth  3 (three) times daily as needed for dizziness. Patient not taking: No sig reported 10/16/20   Melene Plan, DO  ondansetron (ZOFRAN ODT) 4 MG disintegrating tablet Take 1 tablet (4 mg total) by mouth every 8 (eight) hours as needed for nausea or vomiting. Patient not taking: No sig reported 09/16/20   Raspet, Erin K, PA-C  ondansetron (ZOFRAN) 4 MG tablet Take 1 tablet (4 mg total) by mouth every 6 (six) hours. Patient not taking: No sig reported 09/17/20   Alvira Monday, MD  oxyCODONE-acetaminophen (PERCOCET) 5-325 MG tablet Take 1 tablet by mouth every 4 (four) hours as needed for severe pain. Patient not taking: No sig reported 06/21/20 06/21/21  Stechschulte, Hyman Hopes, MD  pantoprazole (PROTONIX) 20 MG tablet Take 2 tablets (40 mg total) by mouth daily for 14  days. Patient not taking: Reported on 11/22/2020 09/17/20 10/01/20  Alvira Monday, MD  enalapril (VASOTEC) 5 MG tablet Take 1 tablet (5 mg total) by mouth daily. Patient not taking: Reported on 03/05/2020 05/21/19 03/05/20  Shirlean Mylar, MD  ferrous sulfate 325 (65 FE) MG tablet Take 1 tablet (325 mg total) by mouth every other day. Patient not taking: Reported on 03/05/2020 05/22/19 03/05/20  Shirlean Mylar, MD    Allergies    Apple, Bee venom, Cinnamon, Mango flavor [flavoring agent], Pineapple, Prunus persica, Tomato, and Peach flavor  Review of Systems   Review of Systems  All other systems reviewed and are negative.  Physical Exam Updated Vital Signs BP 117/73 (BP Location: Left Arm)   Pulse (!) 102   Temp 99.5 F (37.5 C) (Oral)   Resp 18   SpO2 95%   Physical Exam Vitals and nursing note reviewed.  Constitutional:      General: She is not in acute distress.    Appearance: Normal appearance. She is well-developed. She is not toxic-appearing.  HENT:     Head: Normocephalic and atraumatic.  Eyes:     General: Lids are normal.     Conjunctiva/sclera: Conjunctivae normal.     Pupils: Pupils are equal, round, and reactive to light.  Neck:     Thyroid: No thyroid mass.     Trachea: No tracheal deviation.  Cardiovascular:     Rate and Rhythm: Normal rate and regular rhythm.     Heart sounds: Normal heart sounds. No murmur heard.   No gallop.  Pulmonary:     Effort: Pulmonary effort is normal. No respiratory distress.     Breath sounds: Normal breath sounds. No stridor. No decreased breath sounds, wheezing, rhonchi or rales.  Abdominal:     General: There is no distension.     Palpations: Abdomen is soft.     Tenderness: There is no abdominal tenderness. There is no rebound.  Musculoskeletal:        General: Normal range of motion.     Cervical back: Normal range of motion and neck supple.     Lumbar back: Tenderness present.       Back:  Skin:    General: Skin is  warm and dry.     Findings: No abrasion or rash.  Neurological:     Mental Status: She is alert and oriented to person, place, and time. Mental status is at baseline.     GCS: GCS eye subscore is 4. GCS verbal subscore is 5. GCS motor subscore is 6.     Cranial Nerves: Cranial nerves are intact. No cranial nerve deficit.     Sensory: No sensory  deficit.     Motor: Motor function is intact.  Psychiatric:        Attention and Perception: Attention normal.        Speech: Speech normal.        Behavior: Behavior normal.    ED Results / Procedures / Treatments   Labs (all labs ordered are listed, but only abnormal results are displayed) Labs Reviewed  URINALYSIS, ROUTINE W REFLEX MICROSCOPIC  PREGNANCY, URINE    EKG None  Radiology No results found.  Procedures Procedures   Medications Ordered in ED Medications  acetaminophen (TYLENOL) tablet 650 mg (has no administration in time range)    ED Course  I have reviewed the triage vital signs and the nursing notes.  Pertinent labs & imaging results that were available during my care of the patient were reviewed by me and considered in my medical decision making (see chart for details).    MDM Rules/Calculators/A&P                           Pregnancy test negative here.  Urinalysis is contaminated.  Will place on muscle relaxant since this is likely MSK pain and discharged with return precautions Final Clinical Impression(s) / ED Diagnoses Final diagnoses:  None    Rx / DC Orders ED Discharge Orders     None        [name], MD 02/09/21 2106

## 2021-02-13 NOTE — Progress Notes (Deleted)
Office Visit Note  Patient: Bonnie Booth             Date of Birth: [birth year]           MRN: [id number]             PCP: Berkley Harvey, NP Referring: Berkley Harvey, NP Visit Date: 02/14/2021   Subjective:  No chief complaint on file.   History of Present Illness: Bonnie Booth is a 20 y.o. female here for follow up for seronegative arthritis with history of ANA positive oligoarticular JIA after starting hydroxychloroqiune 200 mg PO daily and continued ibuprofen PRN. ***   Previous HPI 12/06/20 Bonnie Booth is a 20 y.o. female here for follow up with joint pains and positive ANA with history of ANA positive oligoarticular JIA. Since our last visit she still has ongoing pain in multiple areas worst at the ankle where she also observes continued swelling. Lab tests demonstrated an elevated ESR otherwise unremarkable.    11/22/20 Bonnie Booth is a 20 y.o. female here for history of JRA. She was originally diagnosed in [place] in 2009 after development of ankle swelling and left 3rd finger swelling and blistering skin rash. Serology at that time included ANA 1:320 and elevated ASO with negative RF and CCP. She was treated for streptococcal infection but no cardiac involvement of rheumatic fever identified.  She has never been on long-term arthritis treatment outside of chronic use of oral NSAIDs for joint pain in multiple areas.  Currently she reports joint pain in multiple areas most severe in the ankles where she frequently experiences swelling.  Other joint pain involves her low back and in the proximal finger joints and some pain in the elbows.  She does not typically notice swelling in her upper extremities besides one persistent nodule on the right thumb.  She denies any problems of alopecia, oral ulcers, lymphadenopathy, skin rashes, Raynaud's symptoms, and has no history of blood clots. She has a healthy 50-year-old son she did experience preeclampsia and gestational diabetes.    Labs reviewed 08/28/20 CBC Hgb 11.7 MCV 76.9 Ferritin 16.3 Vit D 16   6/12//09 EKG left axis deviation TTE normal   No Rheumatology ROS completed.   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   Appendicitis 06/20/2020   Acute appendicitis 06/20/2020   IUD (intrauterine device) in place 05/26/2019   Perineal laceration, second degree 05/20/2019   Normal spontaneous vaginal delivery 05/20/2019   Labor and delivery, indication for care 05/19/2019   Preeclampsia 05/19/2019   Gestational diabetes 03/16/2019   Alpha thalassemia silent carrier 12/02/2018   Supervision of other normal pregnancy, antepartum 11/04/2018   Personal history of arthritis 01/17/2013   Panic disorder 01/17/2013   Adverse food reaction 01/17/2013    Past Medical History:  Diagnosis Date   Anemia    Hypertension    Rheumatoid arthritis ([place])     Family History  Problem Relation Age of Onset   Healthy Son    Past Surgical History:  Procedure Laterality Date   APPENDECTOMY  05/2020   LAPAROSCOPIC APPENDECTOMY N/A 06/21/2020   Procedure: APPENDECTOMY LAPAROSCOPIC;  Surgeon: Felicie Morn, MD;  Location: [facility];  Service: General;  Laterality: N/A;   NO PAST SURGERIES     Social History   Social History Narrative  Not on file   Immunization History  Administered Date(s) Administered   DTaP 06/27/2002, 08/04/2003, 03/12/2004, 03/11/2005   HPV Quadrivalent 01/17/2013   Hepatitis A, Ped/Adol-2 Dose 01/17/2013   Hepatitis B, ped/adol 06/27/2002, 08/04/2003, 03/11/2005   HiB (PRP-OMP) 06/27/2002   IPV 06/27/2002, 08/04/2003, 03/11/2005   Influenza Split 02/02/2019   Influenza,inj,Quad PF,6+ Mos 02/02/2019   MMR 06/27/2002, 03/11/2005   Meningococcal Conjugate 01/17/2013   Meningococcal  Polysaccharide 01/17/2013   Pneumococcal Conjugate-13 06/27/2002   Pneumococcal-Unspecified 06/27/2002   Tdap 01/17/2013, 03/15/2019   Varicella 01/17/2013     Objective: Vital Signs: There were no vitals taken for this visit.   Physical Exam   Musculoskeletal Exam: ***  CDAI Exam: CDAI Score: -- Patient Global: --; Provider Global: -- Swollen: --; Tender: -- Joint Exam 02/14/2021   No joint exam has been documented for this visit   There is currently no information documented on the homunculus. Go to the Rheumatology activity and complete the homunculus joint exam.  Investigation: No additional findings.  Imaging: No results found.  Recent Labs: Lab Results  Component Value Date   WBC 5.8 10/16/2020   HGB 12.1 10/16/2020   PLT 284 10/16/2020   NA 141 10/16/2020   K 4.2 10/16/2020   CL 106 10/16/2020   CO2 28 10/16/2020   GLUCOSE 103 (H) 10/16/2020   BUN 21 (H) 10/16/2020   CREATININE 0.76 10/16/2020   BILITOT 0.4 10/16/2020   ALKPHOS 110 10/16/2020   AST 25 10/16/2020   ALT 29 10/16/2020   PROT 7.3 10/16/2020   ALBUMIN 4.0 10/16/2020   CALCIUM 9.4 10/16/2020   GFRAA >60 05/19/2019    Speciality Comments: No specialty comments available.  Procedures:  No procedures performed Allergies: Apple, Bee venom, Cinnamon, Mango flavor [flavoring agent], Pineapple, Prunus persica, Tomato, and Peach flavor   Assessment / Plan:     Visit Diagnoses: No diagnosis found.  ***  Orders: No orders of the defined types were placed in this encounter.  No orders of the defined types were placed in this encounter.    Follow-Up Instructions: No follow-ups on file.   Collier Salina, MD  Note - This record has been created using [employer].  Chart creation errors have been sought, but may not always  have been located. Such creation errors do not reflect on  the standard of medical care.

## 2021-02-14 ENCOUNTER — Ambulatory Visit: Payer: Self-pay | Admitting: Internal Medicine

## 2021-05-15 ENCOUNTER — Encounter (HOSPITAL_COMMUNITY): Payer: Self-pay

## 2021-05-15 ENCOUNTER — Emergency Department (HOSPITAL_COMMUNITY)
Admission: EM | Admit: 2021-05-15 | Discharge: 2021-05-16 | Discharge status: Against Medical Advice | Payer: Medicaid Other | Attending: Emergency Medicine | Admitting: Emergency Medicine

## 2021-05-15 DIAGNOSIS — R109 Unspecified abdominal pain: Secondary | ICD-10-CM | POA: Diagnosis not present

## 2021-05-15 DIAGNOSIS — R350 Frequency of micturition: Secondary | ICD-10-CM | POA: Insufficient documentation

## 2021-05-15 DIAGNOSIS — Z5321 Procedure and treatment not carried out due to patient leaving prior to being seen by health care provider: Secondary | ICD-10-CM | POA: Insufficient documentation

## 2021-05-15 DIAGNOSIS — R110 Nausea: Secondary | ICD-10-CM | POA: Diagnosis not present

## 2021-05-15 LAB — COMPREHENSIVE METABOLIC PANEL
ALT: 52 U/L — ABNORMAL HIGH (ref 0–44)
AST: 36 U/L (ref 15–41)
Albumin: 4.3 g/dL (ref 3.5–5.0)
Alkaline Phosphatase: 127 U/L — ABNORMAL HIGH (ref 38–126)
Anion gap: 10 (ref 5–15)
BUN: 14 mg/dL (ref 6–20)
CO2: 28 mmol/L (ref 22–32)
Calcium: 9.8 mg/dL (ref 8.9–10.3)
Chloride: 100 mmol/L (ref 98–111)
Creatinine, Ser: 0.72 mg/dL (ref 0.44–1.00)
GFR, Estimated: >60 mL/min (ref >60)
Glucose, Bld: 98 mg/dL (ref 70–99)
Potassium: 3.9 mmol/L (ref 3.5–5.1)
Sodium: 138 mmol/L (ref 135–145)
Total Bilirubin: 0.4 mg/dL (ref 0.3–1.2)
Total Protein: 8.7 g/dL — ABNORMAL HIGH (ref 6.5–8.1)

## 2021-05-15 LAB — CBC WITH DIFFERENTIAL/PLATELET
Abs Immature Granulocytes: 0.03 10*3/uL (ref 0.00–0.07)
Basophils Absolute: 0 10*3/uL (ref 0.0–0.1)
Basophils Relative: 1 %
Eosinophils Absolute: 0.1 10*3/uL (ref 0.0–0.5)
Eosinophils Relative: 1 %
HCT: 43 % (ref 36.0–46.0)
Hemoglobin: 13.2 g/dL (ref 12.0–15.0)
Immature Granulocytes: 0 %
Lymphocytes Relative: 25 %
Lymphs Abs: 2.2 10*3/uL (ref 0.7–4.0)
MCH: 25.3 pg — ABNORMAL LOW (ref 26.0–34.0)
MCHC: 30.7 g/dL (ref 30.0–36.0)
MCV: 82.5 fL (ref 80.0–100.0)
Monocytes Absolute: 0.4 10*3/uL (ref 0.1–1.0)
Monocytes Relative: 4 %
Neutro Abs: 6 10*3/uL (ref 1.7–7.7)
Neutrophils Relative %: 69 %
Platelets: 315 10*3/uL (ref 150–400)
RBC: 5.21 MIL/uL — ABNORMAL HIGH (ref 3.87–5.11)
RDW: 13.9 % (ref 11.5–15.5)
WBC: 8.7 10*3/uL (ref 4.0–10.5)
nRBC: 0 % (ref 0.0–0.2)

## 2021-05-15 LAB — URINALYSIS, ROUTINE W REFLEX MICROSCOPIC
Bilirubin Urine: NEGATIVE
Glucose, UA: NEGATIVE mg/dL
Ketones, ur: NEGATIVE mg/dL
Leukocytes,Ua: NEGATIVE
Nitrite: NEGATIVE
Protein, ur: NEGATIVE mg/dL
Specific Gravity, Urine: 1.019 (ref 1.005–1.030)
pH: 5 (ref 5.0–8.0)

## 2021-05-15 LAB — I-STAT BETA HCG BLOOD, ED (MC, WL, AP ONLY): I-stat hCG, quantitative: <5 m[IU]/mL (ref <5)

## 2021-05-15 LAB — LIPASE, BLOOD: Lipase: 32 U/L (ref 11–51)

## 2021-05-15 NOTE — ED Provider Triage Note (Signed)
Emergency Medicine Provider Triage Evaluation Note  Bonnie Booth , a 21 y.o. female  was evaluated in triage.  Pt complains of bilateral flank pain and abdominal pain.  Patient reports she started having pain in her mid back over both flanks about 4 days ago.  Patient reports over the past 2 days she started to feel more pain in her abdomen as well.  Has had some urinary frequency but denies dysuria or hematuria.  No vaginal discharge or vaginal bleeding.  No nausea, vomiting or fevers.  Review of Systems  Positive: Abdominal pain, flank pain, urinary frequency Negative: Fevers, vomiting  Physical Exam  BP 127/67 (BP Location: Left Arm)    Pulse (!) 102    Temp 98.6 F (37 C) (Oral)    Resp 16    Ht 5\' 6"  (1.676 m)    Wt 99.8 kg    SpO2 100%    BMI 35.51 kg/m  Gen:   Awake, no distress   Resp:  Normal effort  MSK:   Moves extremities without difficulty  Other:  Bilateral flank tenderness and mild epigastric tenderness present on exam  Medical Decision Making  Medically screening exam initiated at 9:31 PM.  Appropriate orders placed.  Bonnie Booth was informed that the remainder of the evaluation will be completed by another provider, this initial triage assessment does not replace that evaluation, and the importance of remaining in the ED until their evaluation is complete.     Jacqlyn Larsen, [place] 05/15/21 2137

## 2021-05-15 NOTE — ED Triage Notes (Signed)
Patient arrives from home with complaint of right and left flank pain that is radiating to the epigastric abdominal region. Pt reports nausea and urinary frequency. Denies vomiting and hematuria.

## 2021-05-16 ENCOUNTER — Ambulatory Visit
Admission: EM | Admit: 2021-05-16 | Discharge: 2021-05-16 | Disposition: A | Discharge status: Home / Self Care | Payer: Medicaid Other | Attending: Internal Medicine | Admitting: Internal Medicine

## 2021-05-16 ENCOUNTER — Other Ambulatory Visit: Payer: Self-pay

## 2021-05-16 ENCOUNTER — Encounter: Payer: Self-pay | Admitting: Emergency Medicine

## 2021-05-16 DIAGNOSIS — R109 Unspecified abdominal pain: Secondary | ICD-10-CM | POA: Diagnosis not present

## 2021-05-16 DIAGNOSIS — R1084 Generalized abdominal pain: Secondary | ICD-10-CM

## 2021-05-16 LAB — POCT URINALYSIS DIP (MANUAL ENTRY)
Bilirubin, UA: NEGATIVE
Glucose, UA: NEGATIVE mg/dL
Ketones, POC UA: NEGATIVE mg/dL
Leukocytes, UA: NEGATIVE
Nitrite, UA: NEGATIVE
Protein Ur, POC: NEGATIVE mg/dL
Spec Grav, UA: 1.025 (ref 1.010–1.025)
Urobilinogen, UA: 0.2 E.U./dL
pH, UA: 6.5 (ref 5.0–8.0)

## 2021-05-16 LAB — POCT URINE PREGNANCY: Preg Test, Ur: NEGATIVE

## 2021-05-16 NOTE — Discharge Instructions (Signed)
Please go to the hospital as soon as you leave urgent care for further evaluation and management. 

## 2021-05-16 NOTE — ED Triage Notes (Signed)
Had labs done yesterday at [facility], having pain in bilateral flanks. Taking amoxicillin she had left over at home to treat. Left before being seen by a provider. Wants her liver and kidney levels check. Today is the 5th day she's been having the pain. Gradually increasing in intensity. Denies dysuria, vaginal irritation/discharge. States she does have an IUD in place. Is a Paediatric nurse for work. Denies injury to back, heavy lifting or pulling.

## 2021-05-16 NOTE — ED Provider Notes (Signed)
[facility]    CSN: [id number] Arrival date & time: 05/16/21  1628      History   Chief Complaint Chief Complaint  Patient presents with   Flank Pain    HPI Bonnie Booth is a 21 y.o. female.   Patient presents with bilateral flank pain with abdominal pain that is located in the mid abdominal area and suprapubic area that started approximately 5 days ago.  Patient was seen in ED yesterday and had blood work and urinalysis completed but left prior to being seen.  Denies urinary burning, urinary frequency, hematuria, vaginal discharge, fever, pelvic pain, back pain.  Denies any known exposure to STD.  Pain is rated 8/10 on pain scale and is constant.  Denies any apparent injury to the area.  No aggravating or relieving factors to pain.  Last menstrual cycle was 2 years ago as patient reports that she has IUD in place and does not have normal menstrual cycles.   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   Appendicitis 06/20/2020   Acute appendicitis 06/20/2020   IUD (intrauterine device) in place 05/26/2019   Perineal laceration, second degree 05/20/2019   Normal spontaneous vaginal delivery 05/20/2019   Labor and delivery, indication for care 05/19/2019   Preeclampsia 05/19/2019   Gestational diabetes 03/16/2019   Alpha thalassemia silent carrier 12/02/2018   Supervision of other normal pregnancy, antepartum 11/04/2018   Personal history of arthritis 01/17/2013   Panic disorder 01/17/2013   Adverse food reaction 01/17/2013    Past Surgical History:  Procedure Laterality Date   APPENDECTOMY  05/2020   LAPAROSCOPIC APPENDECTOMY N/A 06/21/2020   Procedure:  APPENDECTOMY LAPAROSCOPIC;  Surgeon: Felicie Morn, MD;  Location: [facility];  Service: General;  Laterality: N/A;   NO PAST SURGERIES      OB History     Gravida  3   Para  1   Term  1   Preterm      AB  2   Living  1      SAB  2   IAB      Ectopic      Multiple  0   Live Births  1            Home Medications    Prior to Admission medications   Medication Sig Start Date End Date Taking? Authorizing Provider  acetaminophen (TYLENOL) 500 MG tablet Take 2 tablets (1,000 mg total) by mouth every 6 (six) hours as needed. Patient not taking: No sig reported 06/21/20 06/21/21  Stechschulte, Nickola Major, MD  dicyclomine (BENTYL) 20 MG tablet Take 1 tablet (20 mg total) by mouth 2 (two) times daily. Patient not taking: No sig reported 09/17/20   Gareth Morgan, MD  hydroxychloroquine (PLAQUENIL) 200 MG tablet Take 1 tablet (200 mg total) by mouth daily. 12/06/20   Rice, Resa Miner, MD  ibuprofen (ADVIL) 600 MG tablet Take 1 tablet (600 mg total) by mouth every 6 (six) hours as needed. 02/09/21   Lacretia Leigh, MD  ibuprofen (ADVIL) 800 MG tablet Take 1 tablet (800 mg total) by mouth every 8 (eight) hours as needed. 12/06/20   Collier Salina, MD  levonorgestrel (MIRENA) 20 MCG/24HR IUD 1 each by Intrauterine route once. Jan 2021 placed    [provider]  meclizine (ANTIVERT) 25 MG tablet Take 1 tablet (25 mg total) by mouth 3 (three) times daily as needed for dizziness. Patient not taking: No sig reported 10/16/20   Deno Etienne, DO  methocarbamol (ROBAXIN) 500 MG tablet Take 1 tablet (500 mg total) by mouth 2 (two) times daily. 02/09/21   Lacretia Leigh, MD  ondansetron (ZOFRAN ODT) 4 MG disintegrating tablet Take 1 tablet (4 mg total) by mouth every 8 (eight) hours as needed for nausea or vomiting. Patient not taking: No sig reported 09/16/20   Raspet, Erin K, PA-C  ondansetron (ZOFRAN) 4 MG tablet Take 1 tablet (4 mg total) by mouth every 6 (six)  hours. Patient not taking: No sig reported 09/17/20   Gareth Morgan, MD  oxyCODONE-acetaminophen (PERCOCET) 5-325 MG tablet Take 1 tablet by mouth every 4 (four) hours as needed for severe pain. Patient not taking: No sig reported 06/21/20 06/21/21  Stechschulte, Nickola Major, MD  pantoprazole (PROTONIX) 20 MG tablet Take 2 tablets (40 mg total) by mouth daily for 14 days. Patient not taking: Reported on 11/22/2020 09/17/20 10/01/20  Gareth Morgan, MD  enalapril (VASOTEC) 5 MG tablet Take 1 tablet (5 mg total) by mouth daily. Patient not taking: Reported on 03/05/2020 05/21/19 03/05/20  Gladys Damme, MD  ferrous sulfate 325 (65 FE) MG tablet Take 1 tablet (325 mg total) by mouth every other day. Patient not taking: Reported on 03/05/2020 05/22/19 03/05/20  Gladys Damme, MD    Family History Family History  Problem Relation Age of Onset   Healthy Son     Social History Social History   Tobacco Use   Smoking status: Never   Smokeless tobacco: Never  Vaping Use   Vaping Use: Never used  Substance Use Topics   Alcohol use: No   Drug use: Not Currently     Allergies   Apple, Bee venom, Cinnamon, Mango flavor [flavoring agent], Pineapple, Prunus persica, Tomato, and Peach flavor   Review of Systems Review of Systems Per HPI  Physical Exam Triage Vital Signs ED Triage Vitals [05/16/21 1638]  Enc Vitals Group     BP 95/68     Pulse Rate 84     Resp 16     Temp 98.9 F (37.2 C)     Temp Source Oral     SpO2 96 %     Weight      Height      Head Circumference      Peak Flow      Pain Score 9     Pain Loc      Pain Edu?      Excl. in [place]?    No data found.  Updated Vital Signs BP 95/68 (BP Location: Left Arm)    Pulse 84    Temp 98.9 F (37.2 C) (Oral)    Resp 16    SpO2 96%   Visual Acuity Right Eye Distance:   Left Eye Distance:   Bilateral Distance:    Right Eye Near:   Left Eye Near:    Bilateral Near:     Physical Exam Constitutional:      General: She  is not in acute distress.    Appearance: Normal appearance. She is not toxic-appearing or diaphoretic.  HENT:     Head: Normocephalic and atraumatic.  Eyes:     Extraocular Movements: Extraocular movements intact.  Conjunctiva/sclera: Conjunctivae normal.  Cardiovascular:     Rate and Rhythm: Normal rate and regular rhythm.     Pulses: Normal pulses.     Heart sounds: Normal heart sounds.  Pulmonary:     Effort: Pulmonary effort is normal. No respiratory distress.     Breath sounds: Normal breath sounds.  Abdominal:     General: Bowel sounds are normal. There is no distension.     Palpations: Abdomen is soft.     Tenderness: There is abdominal tenderness in the right upper quadrant and suprapubic area.       Comments: Various areas of tenderness to palpation.   Musculoskeletal:     Cervical back: Normal.     Thoracic back: Normal.       Back:     Comments: Tenderness to palpation to circled area on diagram.  Neurological:     General: No focal deficit present.     Mental Status: She is alert and oriented to person, place, and time. Mental status is at baseline.  Psychiatric:        Mood and Affect: Mood normal.        Behavior: Behavior normal.        Thought Content: Thought content normal.        Judgment: Judgment normal.     UC Treatments / Results  Labs (all labs ordered are listed, but only abnormal results are displayed) Labs Reviewed  POCT URINALYSIS DIP (MANUAL ENTRY) - Abnormal; Notable for the following components:      Result Value   Blood, UA trace-intact (*)    All other components within normal limits  POCT URINE PREGNANCY    EKG   Radiology No results found.  Procedures Procedures (including critical care time)  Medications Ordered in UC Medications - No data to display  Initial Impression / Assessment and Plan / UC Course  I have reviewed the triage vital signs and the nursing notes.  Pertinent labs & imaging results that were  available during my care of the patient were reviewed by me and considered in my medical decision making (see chart for details).     Urinalysis does not indicate urinary tract infection but there is trace red blood cells present. This could indicate kidney stone, although pain is not consistent with this. Urine pregnancy was negative.  Patient did have mildly elevated liver enzymes on blood work that was completed at the ED yesterday.  It is likely that patient could have muscular injury but this is not consistent with coexisting abdominal pain and elevated liver enzymes.  Do think that patient needs additional imaging to rule out more worrisome etiologies such as ultrasound of the liver.  Patient was advised to go to the hospital for further evaluation and management as I do not have the capabilities to order further imaging at urgent care.  Patient was agreeable with plan.  Vital signs stable at discharge.  Agree with patient self transport to the hospital. Final Clinical Impressions(s) / UC Diagnoses   Final diagnoses:  Bilateral flank pain  Generalized abdominal pain     Discharge Instructions      Please go to the hospital as soon as you leave urgent care for further evaluation and management.     ED Prescriptions   None    PDMP not reviewed this encounter.   Teodora Medici, [place] 05/16/21 1710

## 2021-05-19 ENCOUNTER — Emergency Department (HOSPITAL_BASED_OUTPATIENT_CLINIC_OR_DEPARTMENT_OTHER): Payer: Medicaid Other

## 2021-05-19 ENCOUNTER — Encounter (HOSPITAL_BASED_OUTPATIENT_CLINIC_OR_DEPARTMENT_OTHER): Payer: Self-pay | Admitting: Emergency Medicine

## 2021-05-19 ENCOUNTER — Emergency Department (HOSPITAL_BASED_OUTPATIENT_CLINIC_OR_DEPARTMENT_OTHER)
Admission: EM | Admit: 2021-05-19 | Discharge: 2021-05-19 | Disposition: A | Discharge status: Home / Self Care | Payer: Medicaid Other | Attending: Emergency Medicine | Admitting: Emergency Medicine

## 2021-05-19 ENCOUNTER — Other Ambulatory Visit: Payer: Self-pay

## 2021-05-19 ENCOUNTER — Emergency Department (HOSPITAL_COMMUNITY): Payer: Medicaid Other

## 2021-05-19 DIAGNOSIS — R1013 Epigastric pain: Secondary | ICD-10-CM | POA: Diagnosis present

## 2021-05-19 DIAGNOSIS — R1011 Right upper quadrant pain: Secondary | ICD-10-CM | POA: Diagnosis not present

## 2021-05-19 LAB — CBC
HCT: 43.9 % (ref 36.0–46.0)
Hemoglobin: 13.3 g/dL (ref 12.0–15.0)
MCH: 24.6 pg — ABNORMAL LOW (ref 26.0–34.0)
MCHC: 30.3 g/dL (ref 30.0–36.0)
MCV: 81.3 fL (ref 80.0–100.0)
Platelets: 297 10*3/uL (ref 150–400)
RBC: 5.4 MIL/uL — ABNORMAL HIGH (ref 3.87–5.11)
RDW: 13.9 % (ref 11.5–15.5)
WBC: 5.5 10*3/uL (ref 4.0–10.5)
nRBC: 0 % (ref 0.0–0.2)

## 2021-05-19 LAB — URINALYSIS, ROUTINE W REFLEX MICROSCOPIC
Bilirubin Urine: NEGATIVE
Glucose, UA: NEGATIVE mg/dL
Hgb urine dipstick: NEGATIVE
Ketones, ur: NEGATIVE mg/dL
Leukocytes,Ua: NEGATIVE
Nitrite: NEGATIVE
Protein, ur: NEGATIVE mg/dL
Specific Gravity, Urine: 1.025 (ref 1.005–1.030)
pH: 6 (ref 5.0–8.0)

## 2021-05-19 LAB — COMPREHENSIVE METABOLIC PANEL
ALT: 42 U/L (ref 0–44)
AST: 25 U/L (ref 15–41)
Albumin: 4.5 g/dL (ref 3.5–5.0)
Alkaline Phosphatase: 113 U/L (ref 38–126)
Anion gap: 8 (ref 5–15)
BUN: 16 mg/dL (ref 6–20)
CO2: 27 mmol/L (ref 22–32)
Calcium: 9.4 mg/dL (ref 8.9–10.3)
Chloride: 102 mmol/L (ref 98–111)
Creatinine, Ser: 0.63 mg/dL (ref 0.44–1.00)
GFR, Estimated: >60 mL/min (ref >60)
Glucose, Bld: 100 mg/dL — ABNORMAL HIGH (ref 70–99)
Potassium: 3.9 mmol/L (ref 3.5–5.1)
Sodium: 137 mmol/L (ref 135–145)
Total Bilirubin: 0.5 mg/dL (ref 0.3–1.2)
Total Protein: 8.2 g/dL — ABNORMAL HIGH (ref 6.5–8.1)

## 2021-05-19 LAB — PREGNANCY, URINE: Preg Test, Ur: NEGATIVE

## 2021-05-19 LAB — LIPASE, BLOOD: Lipase: 33 U/L (ref 11–51)

## 2021-05-19 MED ORDER — IOHEXOL 300 MG/ML  SOLN
100.0000 mL | Freq: Once | INTRAMUSCULAR | Status: AC | PRN
  Administered 2021-05-19: 100 mL via INTRAVENOUS

## 2021-05-19 MED ORDER — DICYCLOMINE HCL 10 MG PO CAPS
10.0000 mg | ORAL_CAPSULE | Freq: Once | ORAL | Status: AC
  Administered 2021-05-19: 10 mg via ORAL
  Filled 2021-05-19: qty 1

## 2021-05-19 MED ORDER — LIDOCAINE VISCOUS HCL 2 % MT SOLN
15.0000 mL | Freq: Once | OROMUCOSAL | Status: AC
  Administered 2021-05-19: 15 mL via ORAL
  Filled 2021-05-19: qty 15

## 2021-05-19 MED ORDER — ONDANSETRON HCL 4 MG/2ML IJ SOLN
4.0000 mg | Freq: Once | INTRAMUSCULAR | Status: AC
  Administered 2021-05-19: 4 mg via INTRAVENOUS
  Filled 2021-05-19: qty 2

## 2021-05-19 MED ORDER — FAMOTIDINE 20 MG PO TABS: 20.0000 mg | ORAL_TABLET | Freq: Two times a day (BID) | ORAL | 0 refills | Status: DC

## 2021-05-19 MED ORDER — SODIUM CHLORIDE 0.9 % IV BOLUS
1000.0000 mL | Freq: Once | INTRAVENOUS | Status: AC
  Administered 2021-05-19: 1000 mL via INTRAVENOUS

## 2021-05-19 MED ORDER — ALUM & MAG HYDROXIDE-SIMETH 200-200-20 MG/5ML PO SUSP
30.0000 mL | Freq: Once | ORAL | Status: AC
  Administered 2021-05-19: 30 mL via ORAL
  Filled 2021-05-19: qty 30

## 2021-05-19 MED ORDER — FAMOTIDINE IN NACL 20-0.9 MG/50ML-% IV SOLN
20.0000 mg | Freq: Once | INTRAVENOUS | Status: AC
  Administered 2021-05-19: 20 mg via INTRAVENOUS
  Filled 2021-05-19: qty 50

## 2021-05-19 MED ORDER — ONDANSETRON HCL 4 MG PO TABS: 4.0000 mg | ORAL_TABLET | Freq: Four times a day (QID) | ORAL | 0 refills | Status: DC

## 2021-05-19 NOTE — ED Provider Notes (Signed)
[facility] EMERGENCY DEPT Provider Note   CSN: [id number] Arrival date & time: 05/19/21  1342     History  Chief Complaint  Patient presents with   Abdominal Pain    Bonnie Booth is a 21 y.o. female.  HPI   Pt is a 21 y/o female with a h/o appendectomy, anemia, HTN, RA, who presents to the ED today c/o abd pain. Pain located to the periumbilical and epigastric areas. Describes pain as a knot that she rates 9/10. Pain is constant. She reports associated nausea, vomiting, diarrhea (resolved).  Denies fevers, chills, dysuria, frequency, urgency, abnormal vaginal bleeding, abnormal vaginal discharge. She denies concern for STD. Has only been sexually active with her husband.   She denies etoh use. She has been taking one tablet of tylenol every night for the last few days due to the pain however she denies excessive use of this medication.  Home Medications Prior to Admission medications   Medication Sig Start Date End Date Taking? Authorizing Provider  famotidine (PEPCID) 20 MG tablet Take 1 tablet (20 mg total) by mouth 2 (two) times daily. 05/19/21  Yes Dede Dobesh S, PA-C  ondansetron (ZOFRAN) 4 MG tablet Take 1 tablet (4 mg total) by mouth every 6 (six) hours. 05/19/21  Yes Damira Kem S, PA-C  acetaminophen (TYLENOL) 500 MG tablet Take 2 tablets (1,000 mg total) by mouth every 6 (six) hours as needed. Patient not taking: No sig reported 06/21/20 06/21/21  Stechschulte, Hyman Hopes, MD  dicyclomine (BENTYL) 20 MG tablet Take 1 tablet (20 mg total) by mouth 2 (two) times daily. Patient not taking: No sig reported 09/17/20   Alvira Monday, MD  hydroxychloroquine (PLAQUENIL) 200 MG tablet Take 1 tablet (200 mg total) by mouth daily. 12/06/20   Rice, Jamesetta Orleans, MD  ibuprofen (ADVIL) 600 MG tablet Take 1 tablet (600 mg total) by mouth every 6 (six) hours as needed. 02/09/21   [name], MD  ibuprofen (ADVIL) 800 MG tablet Take 1 tablet (800 mg total) by  mouth every 8 (eight) hours as needed. 12/06/20   Fuller Plan, MD  levonorgestrel (MIRENA) 20 MCG/24HR IUD 1 each by Intrauterine route once. Jan 2021 placed    [provider]  meclizine (ANTIVERT) 25 MG tablet Take 1 tablet (25 mg total) by mouth 3 (three) times daily as needed for dizziness. Patient not taking: No sig reported 10/16/20   Melene Plan, DO  methocarbamol (ROBAXIN) 500 MG tablet Take 1 tablet (500 mg total) by mouth 2 (two) times daily. 02/09/21   [name], MD  ondansetron (ZOFRAN ODT) 4 MG disintegrating tablet Take 1 tablet (4 mg total) by mouth every 8 (eight) hours as needed for nausea or vomiting. Patient not taking: No sig reported 09/16/20   Raspet, Erin K, PA-C  ondansetron (ZOFRAN) 4 MG tablet Take 1 tablet (4 mg total) by mouth every 6 (six) hours. Patient not taking: No sig reported 09/17/20   Alvira Monday, MD  oxyCODONE-acetaminophen (PERCOCET) 5-325 MG tablet Take 1 tablet by mouth every 4 (four) hours as needed for severe pain. Patient not taking: No sig reported 06/21/20 06/21/21  Stechschulte, Hyman Hopes, MD  pantoprazole (PROTONIX) 20 MG tablet Take 2 tablets (40 mg total) by mouth daily for 14 days. Patient not taking: Reported on 11/22/2020 09/17/20 10/01/20  Alvira Monday, MD  enalapril (VASOTEC) 5 MG tablet Take 1 tablet (5 mg total) by mouth daily. Patient not taking: Reported on 03/05/2020 05/21/19 03/05/20  Shirlean Mylar,  MD  ferrous sulfate 325 (65 FE) MG tablet Take 1 tablet (325 mg total) by mouth every other day. Patient not taking: Reported on 03/05/2020 05/22/19 03/05/20  Shirlean Mylar, MD      Allergies    Apple, Bee venom, Cinnamon, Mango flavor [flavoring agent], Pineapple, Prunus persica, Tomato, and Peach flavor    Review of Systems   Review of Systems See HPI for pertinent positives or negatives.   Physical Exam Updated Vital Signs BP 109/61 (BP Location: Right Arm)    Pulse 95    Temp 97.8 F (36.6 C)    Resp 16    SpO2  97%  Physical Exam  ED Results / Procedures / Treatments   Labs (all labs ordered are listed, but only abnormal results are displayed) Labs Reviewed  COMPREHENSIVE METABOLIC PANEL - Abnormal; Notable for the following components:      Result Value   Glucose, Bld 100 (*)    Total Protein 8.2 (*)    All other components within normal limits  CBC - Abnormal; Notable for the following components:   RBC 5.40 (*)    MCH 24.6 (*)    All other components within normal limits  LIPASE, BLOOD  URINALYSIS, ROUTINE W REFLEX MICROSCOPIC  PREGNANCY, URINE    EKG None  Radiology CT ABDOMEN PELVIS W CONTRAST  Result Date: 05/19/2021 CLINICAL DATA:  Nausea and vomiting.  Abdominal pain. EXAM: CT ABDOMEN AND PELVIS WITH CONTRAST TECHNIQUE: Multidetector CT imaging of the abdomen and pelvis was performed using the standard protocol following bolus administration of intravenous contrast. RADIATION DOSE REDUCTION: This exam was performed according to the departmental dose-optimization program which includes automated exposure control, adjustment of the mA and/or kV according to patient size and/or use of iterative reconstruction technique. CONTRAST:  OMNIPAQUE IOHEXOL 300 MG/ML  SOLN COMPARISON:  07/11/2020 FINDINGS: Lower chest: Unremarkable. Hepatobiliary: No suspicious focal abnormality within the liver parenchyma. There is no evidence for gallstones, gallbladder wall thickening, or pericholecystic fluid. No intrahepatic or extrahepatic biliary dilation. Pancreas: No focal mass lesion. No dilatation of the main duct. No intraparenchymal cyst. No peripancreatic edema. Spleen: No splenomegaly. No focal mass lesion. Adrenals/Urinary Tract: No adrenal nodule or mass. Right kidney unremarkable. 1-2 mm nonobstructing stone noted upper pole left kidney. No evidence for hydroureter. The urinary bladder appears normal for the degree of distention. Stomach/Bowel: Stomach is unremarkable. No gastric wall  thickening. No evidence of outlet obstruction. Duodenum is normally positioned as is the ligament of Treitz. No small bowel wall thickening. No small bowel dilatation. The terminal ileum is normal. Nonvisualization of the appendix is consistent with the reported history of appendectomy. No gross colonic mass. No colonic wall thickening. Vascular/Lymphatic: Normal abdominal aorta. There is no gastrohepatic or hepatoduodenal ligament lymphadenopathy. No retroperitoneal or mesenteric lymphadenopathy. No pelvic sidewall lymphadenopathy. Reproductive: IUD is visualized in the uterus. IUD is rotated 90 degrees along the long axis with the arms directed anteriorly and posteriorly into the myometrium rather than in plane with the endometrial canal (see sagittal image 67/series 6). This position is stable since prior study from almost 1 year ago. There is no adnexal mass. Other: No intraperitoneal free fluid. Musculoskeletal: No worrisome lytic or sclerotic osseous abnormality. IMPRESSION: 1. No acute findings in the abdomen or pelvis. Specifically, no findings to explain the patient's history of nausea and vomiting. 2. 1-2 mm nonobstructing left renal stone. 3. Stable IUD position with arms extending anteriorly and posteriorly into the myometrium. Electronically Signed   By:  Kennith CenterEric  Mansell M.D.   On: 05/19/2021 16:35   US Abdomen Limited RUQ (LIVER/GB)  Result Date: 05/19/2021 CLINICAL DATA:  Epigastric pain. EXAM: ULTRASOUND ABDOMEN LIMITED RIGHT UPPER QUADRANT COMPARISON:  Prior ultrasound Sep 17, 2020 FINDINGS: Gallbladder: No gallstones or wall thickening visualized. No sonographic Murphy sign noted by sonographer. Probable 2 mm polyp. Common bile duct: Diameter: 2 mm Liver: Within the left hepatic lobe there is a 1.8 x 1.5 x 2.5 cm low-attenuation lesion, previously demonstrated on prior ultrasound Sep 17, 2020. Portal vein is patent on color Doppler imaging with normal direction of blood flow towards the liver.  Other: None. IMPRESSION: No cholelithiasis or sonographic evidence for acute cholecystitis. Redemonstrated hypoechoic lesion within the left hepatic lobe. Recommend correlation with outside prior CT. Otherwise, this warrants definitive diagnosis with pre and post contrast-enhanced MRI in the nonacute setting. Electronically Signed   By: Annia Beltrew  Davis M.D.   On: 05/19/2021 15:51    Procedures Procedures    Medications Ordered in ED Medications  famotidine (PEPCID) IVPB 20 mg premix (0 mg Intravenous Stopped 05/19/21 1601)  alum & mag hydroxide-simeth (MAALOX/MYLANTA) 200-200-20 MG/5ML suspension 30 mL (30 mLs Oral Given 05/19/21 1509)    And  lidocaine (XYLOCAINE) 2 % viscous mouth solution 15 mL (15 mLs Oral Given 05/19/21 1509)  sodium chloride 0.9 % bolus 1,000 mL (1,000 mLs Intravenous New Bag/Given 05/19/21 1505)  ondansetron (ZOFRAN) injection 4 mg (4 mg Intravenous Given 05/19/21 1506)  dicyclomine (BENTYL) capsule 10 mg (10 mg Oral Given 05/19/21 1610)  iohexol (OMNIPAQUE) 300 MG/ML solution 100 mL (100 mLs Intravenous Contrast Given 05/19/21 1618)    ED Course/ Medical Decision Making/ A&P                           Medical Decision Making Amount and/or Complexity of Data Reviewed Labs: ordered. Radiology: ordered.  Risk OTC drugs. Prescription drug management.   21 year old female presents emergency department today for evaluation of epigastric pain.  She is been seen multiple times of the left last week for similar symptoms.  Her work-ups have been reassuring thus far.  She does have some mild epigastric tenderness on exam.  Her CBC does not show any evidence of a leukocytosis, anemia, lipase is negative, CMP is reassuring, UA is negative, urine pregnancy test is negative.  Right upper quadrant ultrasound does not show any evidence of cholecystitis but she does have a hypoechoic liver lesion which she is aware of.  A CT scan of the abdomen/pelvis was completed which did not show any  acute findings to explain the patient's symptoms.  I have low suspicion for any life-threatening intra-abdominal/pelvic pathology at this time to warrant further work-up or admission.  I suspect she likely has gastritis.  We will start her on Pepcid and also give Rx for Zofran for home.  I have advised that she follow-up with her PCP in the next week for reassessment and return to the ED for any new or worsening symptoms in the meantime.  She has been given multiple doses of medication here in the emergency department on reassessment she is had no further episodes of vomiting and has been able to tolerate p.o. without difficulty.  She is very well-appearing and is in no acute distress and I feel that she is appropriate for discharge home.  All questions answered.  Patient stable for discharge.  Final Clinical Impression(s) / ED Diagnoses Final diagnoses:  RUQ abdominal pain  Epigastric pain    Rx / DC Orders ED Discharge Orders          Ordered    famotidine (PEPCID) 20 MG tablet  2 times daily        05/19/21 1647    ondansetron (ZOFRAN) 4 MG tablet  Every 6 hours        05/19/21 1647              Corrin Hingle S, PA-C 05/19/21 1658    Ernie Avena, MD 05/19/21 2033

## 2021-05-19 NOTE — Discharge Instructions (Signed)
Take pepcid and zofran as directed   Please follow up with your primary doctor within the next 5-7 days.  If you do not have a primary care provider, information for a healthcare clinic has been provided for you to make arrangements for follow up care. Please return to the ER sooner if you have any new or worsening symptoms, or if you have any of the following symptoms:  Abdominal pain that does not go away.  You have a fever.  You keep throwing up (vomiting).  The pain is felt only in portions of the abdomen. Pain in the right side could possibly be appendicitis. In an adult, pain in the left lower portion of the abdomen could be colitis or diverticulitis.  You pass bloody or black tarry stools.  There is bright red blood in the stool.  The constipation stays for more than 4 days.  There is belly (abdominal) or rectal pain.  You do not seem to be getting better.  You have any questions or concerns.

## 2021-05-19 NOTE — ED Triage Notes (Addendum)
Pt presents to ED BIB [facility]. Pt c/o abd pain that has worsened since being seen previously. Pt also reports emesis all night. Liver enzymes elevated earlier in the week. EMS VSS

## 2021-06-17 NOTE — Progress Notes (Unsigned)
Office Visit Note  Patient: Bonnie Booth             Date of Birth: 04/11/2001           MRN: [id number]             PCP: Berkley Harvey, NP Referring: Berkley Harvey, NP Visit Date: 06/18/2021   Subjective:  No chief complaint on file.   History of Present Illness: Bonnie Booth is a 21 y.o. 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 Worst symptoms at last visit were bilateral ankle pain and swelling. Recommended starting HCQ at 200 mg then increase to 400 mg daily along with PRN NSAIDs.***   Previous HPI 12/06/20 Bonnie Booth is a 21 y.o. female here for follow up with joint pains and positive ANA with history of ANA positive oligoarticular JIA. Since our last visit she still has ongoing pain in multiple areas worst at the ankle where she also observes continued swelling. Lab tests demonstrated an elevated ESR otherwise unremarkable.    Previous HPI: 11/22/20 Bonnie Booth is a 21 y.o. female here for history of JRA. She was originally diagnosed in [place] in 2009 after development of ankle swelling and left 3rd finger swelling and blistering skin rash. Serology at that time included ANA 1:320 and elevated ASO with negative RF and CCP. She was treated for streptococcal infection but no cardiac involvement of rheumatic fever identified.  She has never been on long-term arthritis treatment outside of chronic use of oral NSAIDs for joint pain in multiple areas.  Currently she reports joint pain in multiple areas most severe in the ankles where she frequently experiences swelling.  Other joint pain involves her low back and in the proximal finger joints and some pain in the elbows.  She does not typically notice swelling in her upper extremities besides one persistent nodule on the right thumb.  She denies any problems of alopecia, oral ulcers, lymphadenopathy, skin rashes, Raynaud's symptoms, and has no history of blood clots. She  has a healthy 83-year-old son she did experience preeclampsia and gestational diabetes.   No Rheumatology ROS completed.   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[place]) 11/22/2020   Positive ANA (antinuclear antibody) 11/22/2020   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   Appendicitis 06/20/2020   Acute appendicitis 06/20/2020   IUD (intrauterine device) in place 05/26/2019   Perineal laceration, second degree 05/20/2019   Normal spontaneous vaginal delivery 05/20/2019   Labor and delivery, indication for care 05/19/2019   Preeclampsia 05/19/2019   Gestational diabetes 03/16/2019   Alpha thalassemia silent carrier 12/02/2018   Supervision of other normal pregnancy, antepartum 11/04/2018   Personal history of arthritis 01/17/2013   Panic disorder 01/17/2013   Adverse food reaction 01/17/2013    Past Medical History:  Diagnosis Date   Anemia    Hypertension    Rheumatoid arthritis ([place])     Family History  Problem Relation Age of Onset   Healthy Son    Past Surgical History:  Procedure Laterality Date   APPENDECTOMY  05/2020   LAPAROSCOPIC APPENDECTOMY N/A 06/21/2020   Procedure: APPENDECTOMY LAPAROSCOPIC;  Surgeon: Stechschulte, Nickola Major, MD;  Location: [facility];  Service: General;  Laterality: N/A;   NO PAST SURGERIES     Social History   Social History Narrative   Not on file  Immunization History  Administered Date(s) Administered   DTaP 06/27/2002, 08/04/2003, 03/12/2004, 03/11/2005   HPV Quadrivalent 01/17/2013   Hepatitis A, Ped/Adol-2 Dose 01/17/2013   Hepatitis B, ped/adol 06/27/2002, 08/04/2003, 03/11/2005   HiB (PRP-OMP) 06/27/2002   IPV 06/27/2002, 08/04/2003, 03/11/2005   Influenza Split 02/02/2019   Influenza,inj,Quad PF,6+ Mos 02/02/2019   MMR 06/27/2002, 03/11/2005   Meningococcal Conjugate 01/17/2013   Meningococcal Polysaccharide 01/17/2013    Pneumococcal Conjugate-13 06/27/2002   Pneumococcal-Unspecified 06/27/2002   Tdap 01/17/2013, 03/15/2019   Varicella 01/17/2013     Objective: Vital Signs: There were no vitals taken for this visit.   Physical Exam   Musculoskeletal Exam: ***  CDAI Exam: CDAI Score: -- Patient Global: --; Provider Global: -- Swollen: --; Tender: -- Joint Exam 06/18/2021   No joint exam has been documented for this visit   There is currently no information documented on the homunculus. Go to the Rheumatology activity and complete the homunculus joint exam.  Investigation: No additional findings.  Imaging: CT ABDOMEN PELVIS W CONTRAST  Result Date: 05/19/2021 CLINICAL DATA:  Nausea and vomiting.  Abdominal pain. EXAM: CT ABDOMEN AND PELVIS WITH CONTRAST TECHNIQUE: Multidetector CT imaging of the abdomen and pelvis was performed using the standard protocol following bolus administration of intravenous contrast. RADIATION DOSE REDUCTION: This exam was performed according to the departmental dose-optimization program which includes automated exposure control, adjustment of the mA and/or kV according to patient size and/or use of iterative reconstruction technique. CONTRAST:  159m OMNIPAQUE IOHEXOL 300 MG/ML  SOLN COMPARISON:  07/11/2020 FINDINGS: Lower chest: Unremarkable. Hepatobiliary: No suspicious focal abnormality within the liver parenchyma. There is no evidence for gallstones, gallbladder wall thickening, or pericholecystic fluid. No intrahepatic or extrahepatic biliary dilation. Pancreas: No focal mass lesion. No dilatation of the main duct. No intraparenchymal cyst. No peripancreatic edema. Spleen: No splenomegaly. No focal mass lesion. Adrenals/Urinary Tract: No adrenal nodule or mass. Right kidney unremarkable. 1-2 mm nonobstructing stone noted upper pole left kidney. No evidence for hydroureter. The urinary bladder appears normal for the degree of distention. Stomach/Bowel: Stomach is  unremarkable. No gastric wall thickening. No evidence of outlet obstruction. Duodenum is normally positioned as is the ligament of Treitz. No small bowel wall thickening. No small bowel dilatation. The terminal ileum is normal. Nonvisualization of the appendix is consistent with the reported history of appendectomy. No gross colonic mass. No colonic wall thickening. Vascular/Lymphatic: Normal abdominal aorta. There is no gastrohepatic or hepatoduodenal ligament lymphadenopathy. No retroperitoneal or mesenteric lymphadenopathy. No pelvic sidewall lymphadenopathy. Reproductive: IUD is visualized in the uterus. IUD is rotated 90 degrees along the long axis with the arms directed anteriorly and posteriorly into the myometrium rather than in plane with the endometrial canal (see sagittal image 67/series 6). This position is stable since prior study from almost 1 year ago. There is no adnexal mass. Other: No intraperitoneal free fluid. Musculoskeletal: No worrisome lytic or sclerotic osseous abnormality. IMPRESSION: 1. No acute findings in the abdomen or pelvis. Specifically, no findings to explain the patient's history of nausea and vomiting. 2. 1-2 mm nonobstructing left renal stone. 3. Stable IUD position with arms extending anteriorly and posteriorly into the myometrium. Electronically Signed   By: EMisty StanleyM.D.   On: 05/19/2021 16:35   UK[place]Abdomen Limited RUQ (LIVER/GB)  Result Date: 05/19/2021 CLINICAL DATA:  Epigastric pain. EXAM: ULTRASOUND ABDOMEN LIMITED RIGHT UPPER QUADRANT COMPARISON:  Prior ultrasound Sep 17, 2020 FINDINGS: Gallbladder: No gallstones or wall thickening visualized. No sonographic Murphy sign noted by  sonographer. Probable 2 mm polyp. Common bile duct: Diameter: 2 mm Liver: Within the left hepatic lobe there is a 1.8 x 1.5 x 2.5 cm low-attenuation lesion, previously demonstrated on prior ultrasound Sep 17, 2020. Portal vein is patent on color Doppler imaging with normal direction of  blood flow towards the liver. Other: None. IMPRESSION: No cholelithiasis or sonographic evidence for acute cholecystitis. Redemonstrated hypoechoic lesion within the left hepatic lobe. Recommend correlation with outside prior CT. Otherwise, this warrants definitive diagnosis with pre and post contrast-enhanced MRI in the nonacute setting. Electronically Signed   By: Lovey Newcomer M.D.   On: 05/19/2021 15:51    Recent Labs: Lab Results  Component Value Date   WBC 5.5 05/19/2021   HGB 13.3 05/19/2021   PLT 297 05/19/2021   NA 137 05/19/2021   K 3.9 05/19/2021   CL 102 05/19/2021   CO2 27 05/19/2021   GLUCOSE 100 (H) 05/19/2021   BUN 16 05/19/2021   CREATININE 0.63 05/19/2021   BILITOT 0.5 05/19/2021   ALKPHOS 113 05/19/2021   AST 25 05/19/2021   ALT 42 05/19/2021   PROT 8.2 (H) 05/19/2021   ALBUMIN 4.5 05/19/2021   CALCIUM 9.4 05/19/2021   GFRAA >60 05/19/2019    Speciality Comments: No specialty comments available.  Procedures:  No procedures performed Allergies: Apple juice, Bee venom, Cinnamon, Mango flavor [flavoring agent], Pineapple, Prunus persica, Tomato, and Peach flavor   Assessment / Plan:     Visit Diagnoses: No diagnosis found.  ***  Orders: No orders of the defined types were placed in this encounter.  No orders of the defined types were placed in this encounter.    Follow-Up Instructions: No follow-ups on file.   Collier Salina, MD  Note - This record has been created using [employer].  Chart creation errors have been sought, but may not always  have been located. Such creation errors do not reflect on  the standard of medical care.

## 2021-06-18 ENCOUNTER — Ambulatory Visit: Payer: Medicaid Other | Admitting: Internal Medicine

## 2021-08-03 ENCOUNTER — Other Ambulatory Visit: Payer: Self-pay

## 2021-08-03 ENCOUNTER — Emergency Department (HOSPITAL_COMMUNITY)
Admission: EM | Admit: 2021-08-03 | Discharge: 2021-08-04 | Disposition: A | Discharge status: Home / Self Care | Payer: Medicaid Other | Attending: Emergency Medicine | Admitting: Emergency Medicine

## 2021-08-03 ENCOUNTER — Encounter (HOSPITAL_COMMUNITY): Payer: Self-pay

## 2021-08-03 DIAGNOSIS — N898 Other specified noninflammatory disorders of vagina: Secondary | ICD-10-CM | POA: Insufficient documentation

## 2021-08-03 DIAGNOSIS — Z711 Person with feared health complaint in whom no diagnosis is made: Secondary | ICD-10-CM

## 2021-08-03 LAB — URINALYSIS, ROUTINE W REFLEX MICROSCOPIC
Bacteria, UA: NONE SEEN
Bilirubin Urine: NEGATIVE
Glucose, UA: NEGATIVE mg/dL
Hgb urine dipstick: NEGATIVE
Ketones, ur: NEGATIVE mg/dL
Nitrite: NEGATIVE
Protein, ur: NEGATIVE mg/dL
Specific Gravity, Urine: 1.024 (ref 1.005–1.030)
pH: 6 (ref 5.0–8.0)

## 2021-08-03 LAB — PREGNANCY, URINE: Preg Test, Ur: NEGATIVE

## 2021-08-03 NOTE — ED Triage Notes (Signed)
Patient is having vaginal discharge for 2 days. First milky white-ish. Now it is yellow. Burning itchy feeling. She tried [employer] but she said it did not work. Had unprotected oral sex 2 days ago. And then her symptoms started showing.  ?

## 2021-08-03 NOTE — ED Provider Notes (Signed)
?[facility] DEPT ?Provider Note ? ? ?CSN: [id number] ?Arrival date & time: 08/03/21  2133 ? ?  ? ?History ? ?Chief Complaint  ?Patient presents with  ? Vaginal Discharge  ? ? ?Bonnie Booth is a 21 y.o. female here for evaluation of vaginal discharge.  Began 2 days ago.  Initially white.  Thought was a yeast infection and tried Monistat at home.  Now turned to yellow discharge.  Has had a burning and itching sensation to Vagina.  Had new sexual partner 2 days ago however states this was only oral intercourse. No sore throat. Has IUD. No fever, abd pain, pelvic pain, rash, lesions. No abnormal vaginal discharge. No prior hx of STD. ? ?HPI ? ?  ? ?Home Medications ?Prior to Admission medications   ?Medication Sig Start Date End Date Taking? Authorizing Provider  ?doxycycline (VIBRAMYCIN) 100 MG capsule Take 1 capsule (100 mg total) by mouth 2 (two) times daily. 08/04/21  Yes Rayan Ines A, PA-C  ?famotidine (PEPCID) 20 MG tablet Take 1 tablet (20 mg total) by mouth 2 (two) times daily. 05/19/21  Yes Couture, Cortni S, PA-C  ?fluconazole (DIFLUCAN) 150 MG tablet Take 1 tablet (150 mg total) by mouth daily. 08/04/21  Yes Makaelyn Aponte A, PA-C  ?levonorgestrel (MIRENA) 20 MCG/24HR IUD 1 each by Intrauterine route once. Jan 2021 placed   Yes [provider]  ?enalapril (VASOTEC) 5 MG tablet Take 1 tablet (5 mg total) by mouth daily. ?Patient not taking: Reported on 03/05/2020 05/21/19 03/05/20  Gladys Damme, MD  ?ferrous sulfate 325 (65 FE) MG tablet Take 1 tablet (325 mg total) by mouth every other day. ?Patient not taking: Reported on 03/05/2020 05/22/19 03/05/20  Gladys Damme, MD  ?   ? ?Allergies    ?Apple juice, Bee venom, Cinnamon, Mango flavor [flavoring agent], Pineapple, Prunus persica, Tomato, and Peach flavor   ? ?Review of Systems   ?Review of Systems  ?Constitutional: Negative.   ?HENT: Negative.    ?Cardiovascular: Negative.   ?Gastrointestinal: Negative.    ?Genitourinary:  Positive for vaginal discharge. Negative for decreased urine volume, difficulty urinating, dyspareunia, dysuria, flank pain, frequency, genital sores, hematuria, menstrual problem, pelvic pain, urgency, vaginal bleeding and vaginal pain.  ?Musculoskeletal: Negative.   ?Skin: Negative.   ?Neurological: Negative.   ?All other systems reviewed and are negative. ? ?Physical Exam ?Updated Vital Signs ?BP 111/74   Pulse 71   Temp 98.2 ?F (36.8 ?C) (Oral)   Resp 18   Ht 5\' 6"  (1.676 m)   Wt 99 kg   SpO2 99%   BMI 35.23 kg/m?  ?Physical Exam ?Vitals and nursing note reviewed. Exam conducted with a chaperone present.  ?Constitutional:   ?   General: She is not in acute distress. ?   Appearance: She is well-developed. She is not ill-appearing.  ?HENT:  ?   Head: Normocephalic and atraumatic.  ?Eyes:  ?   Pupils: Pupils are equal, round, and reactive to light.  ?Cardiovascular:  ?   Rate and Rhythm: Normal rate.  ?   Pulses: Normal pulses.  ?   Heart sounds: Normal heart sounds.  ?Pulmonary:  ?   Effort: Pulmonary effort is normal. No respiratory distress.  ?   Breath sounds: No stridor.  ?Abdominal:  ?   General: Bowel sounds are normal. There is no distension.  ?   Palpations: Abdomen is soft.  ?   Tenderness: There is no abdominal tenderness. There is no right CVA tenderness,  left CVA tenderness, guarding or rebound.  ?Genitourinary: ?   Comments: Normal appearing external female genitalia without rashes or lesions, normal vaginal epithelium. Normal appearing cervix without petechiae. Thick white/ light yellow discharge at cervical os. Cervical os is closed. There is bleeding noted at the os. No odor. Bimanual: No CMT,  nontender.  No palpable adnexal masses or tenderness. Uterus midline and not fixed. Rectovaginal exam was deferred.  No cystocele or rectocele noted. No pelvic lymphadenopathy noted. Wet prep was obtained.  Cultures for gonorrhea and chlamydia collected. Exam performed with chaperone  in room. ?  ?Musculoskeletal:     ?   General: Normal range of motion.  ?   Cervical back: Normal range of motion.  ?Skin: ?   General: Skin is warm and dry.  ?   Capillary Refill: Capillary refill takes less than 2 seconds.  ?Neurological:  ?   General: No focal deficit present.  ?   Mental Status: She is alert.  ?Psychiatric:     ?   Mood and Affect: Mood normal.  ? ? ?ED Results / Procedures / Treatments   ?Labs ?(all labs ordered are listed, but only abnormal results are displayed) ?Labs Reviewed  ?URINALYSIS, ROUTINE W REFLEX MICROSCOPIC - Abnormal; Notable for the following components:  ?    Result Value  ? Leukocytes,Ua TRACE (*)   ? All other components within normal limits  ?WET PREP, GENITAL  ?PREGNANCY, URINE  ?RPR  ?HIV ANTIBODY (ROUTINE TESTING W REFLEX)  ?GC/CHLAMYDIA PROBE AMP ([place]) NOT AT [facility]  ? ? ?EKG ?None ? ?Radiology ?No results found. ? ?Procedures ?Procedures  ? ? ?Medications Ordered in ED ?Medications  ?cefTRIAXone (ROCEPHIN) injection 500 mg (500 mg Intramuscular Given 08/04/21 0058)  ?doxycycline (VIBRA-TABS) tablet 100 mg (100 mg Oral Given 08/04/21 0057)  ?sterile water (preservative free) injection (2.1 mLs  Given 08/04/21 0101)  ? ? ?ED Course/ Medical Decision Making/ A&P ?  ? ? ?Here for vaginal discharge, some pruritis without rash or lesions. Abd soft non tender. Recent new partner. No sore throat. ? ?Labs personally viewed and interupted: ? ?Wet Prep negative for BV, trichomoniasis ?Pregnancy test negative ?UA negative for infection. ? ?GU exam does have some thick yellow vaginal discharge however no CMT, adnexal tenderness.  Low suspicion for PID, TOA, torsion, ectopic preg, uti. ? ?Discussed exam as well as results with patient.  She prefers empiric treatment with antibiotics until the remaining STD testing returns.  I feel this is reasonable.  Given Rocephin and Doxy here in the ED.  She will follow-up outpatient and results of her STD screening encourage return for new  worsening complaints ? ?The patient has been appropriately medically screened and/or stabilized in the ED. I have low suspicion for any other emergent medical condition which would require further screening, evaluation or treatment in the ED or require inpatient management. ? ?Patient is hemodynamically stable and in no acute distress.  Patient able to ambulate in department prior to ED.  Evaluation does not show acute pathology that would require ongoing or additional emergent interventions while in the emergency department or further inpatient treatment.  I have discussed the diagnosis with the patient and answered all questions.  Pain is been managed while in the emergency department and patient has no further complaints prior to discharge.  Patient is comfortable with plan discussed in room and is stable for discharge at this time.  I have discussed strict return precautions for returning to the emergency department.  Patient was encouraged to follow-up with PCP/specialist refer to at discharge.  ? ?                        ?Medical Decision Making ?Amount and/or Complexity of Data Reviewed ?External Data Reviewed: labs, radiology and notes. ?Labs: ordered. Decision-making details documented in ED Course. ? ?Risk ?OTC drugs. ?Prescription drug management. ?Diagnosis or treatment significantly limited by social determinants of health. ? ? ? ? ? ? ? ? ? ?Final Clinical Impression(s) / ED Diagnoses ?Final diagnoses:  ?Vaginal discharge  ?Concern about STD in female without diagnosis  ? ? ?Rx / DC Orders ?ED Discharge Orders   ? ?      Ordered  ?  doxycycline (VIBRAMYCIN) 100 MG capsule  2 times daily       ? 08/04/21 0029  ?  fluconazole (DIFLUCAN) 150 MG tablet  Daily       ? 08/04/21 0029  ? ?  ?  ? ?  ? ? ?  ?Yolinda Duerr A, PA-C ?08/04/21 0108 ? ?  ?Maudie Flakes, MD ?08/04/21 [phone] ? ?

## 2021-08-04 LAB — RPR: RPR Ser Ql: NONREACTIVE

## 2021-08-04 LAB — WET PREP, GENITAL
Clue Cells Wet Prep HPF POC: NONE SEEN
Sperm: NONE SEEN
Trich, Wet Prep: NONE SEEN
WBC, Wet Prep HPF POC: <10 (ref <10)
Yeast Wet Prep HPF POC: NONE SEEN

## 2021-08-04 LAB — HIV ANTIBODY (ROUTINE TESTING W REFLEX): HIV Screen 4th Generation wRfx: NONREACTIVE

## 2021-08-04 MED ORDER — FLUCONAZOLE 150 MG PO TABS: 150.0000 mg | ORAL_TABLET | Freq: Every day | ORAL | 0 refills | Status: DC

## 2021-08-04 MED ORDER — DOXYCYCLINE HYCLATE 100 MG PO TABS
100.0000 mg | ORAL_TABLET | Freq: Once | ORAL | Status: AC
  Administered 2021-08-04: 100 mg via ORAL
  Filled 2021-08-04: qty 1

## 2021-08-04 MED ORDER — STERILE WATER FOR INJECTION IJ SOLN
INTRAMUSCULAR | Status: AC
  Administered 2021-08-04: 2.1 mL
  Filled 2021-08-04: qty 10

## 2021-08-04 MED ORDER — DOXYCYCLINE HYCLATE 100 MG PO CAPS: 100.0000 mg | ORAL_CAPSULE | Freq: Two times a day (BID) | ORAL | 0 refills | Status: DC

## 2021-08-04 MED ORDER — CEFTRIAXONE SODIUM 1 G IJ SOLR
500.0000 mg | Freq: Once | INTRAMUSCULAR | Status: AC
Start: 2021-08-04 — End: 2021-08-04
  Administered 2021-08-04: 500 mg via INTRAMUSCULAR
  Filled 2021-08-04: qty 10

## 2021-08-04 NOTE — Discharge Instructions (Addendum)
I have written you for the antibiotics that we talked about in the room.  Make sure you take after you have eaten a meal. ? ?I have also written you a dose of Diflucan in case you develop yeast infection type symptoms.  This is a single dose medication. ? ?You will be called for gonorrhea, chlamydia, syphilis or HIV test is positive.  At that point if positive you will need to let your partners know so they may be treated as well. ? ?Return for new or worsening symptoms ?

## 2021-08-05 LAB — GC/CHLAMYDIA PROBE AMP (~~LOC~~) NOT AT ARMC
Chlamydia: NEGATIVE
Comment: NEGATIVE
Comment: NORMAL
Neisseria Gonorrhea: NEGATIVE

## 2021-08-29 ENCOUNTER — Other Ambulatory Visit: Payer: Self-pay

## 2021-08-29 ENCOUNTER — Encounter (HOSPITAL_COMMUNITY): Payer: Self-pay

## 2021-08-29 ENCOUNTER — Emergency Department (HOSPITAL_COMMUNITY)
Admission: EM | Admit: 2021-08-29 | Discharge: 2021-08-30 | Disposition: A | Discharge status: Home / Self Care | Payer: Medicaid Other | Attending: Emergency Medicine | Admitting: Emergency Medicine

## 2021-08-29 DIAGNOSIS — I10 Essential (primary) hypertension: Secondary | ICD-10-CM | POA: Diagnosis not present

## 2021-08-29 DIAGNOSIS — R109 Unspecified abdominal pain: Secondary | ICD-10-CM | POA: Diagnosis present

## 2021-08-29 DIAGNOSIS — Z79899 Other long term (current) drug therapy: Secondary | ICD-10-CM | POA: Insufficient documentation

## 2021-08-29 DIAGNOSIS — R1084 Generalized abdominal pain: Secondary | ICD-10-CM | POA: Diagnosis not present

## 2021-08-29 DIAGNOSIS — N9489 Other specified conditions associated with female genital organs and menstrual cycle: Secondary | ICD-10-CM | POA: Insufficient documentation

## 2021-08-29 NOTE — ED Triage Notes (Signed)
Patient is having right upper abdominal pain. She thinks it could be her gall bladder. No vomiting. ?

## 2021-08-30 LAB — URINALYSIS, ROUTINE W REFLEX MICROSCOPIC
Bacteria, UA: NONE SEEN
Bilirubin Urine: NEGATIVE
Glucose, UA: NEGATIVE mg/dL
Ketones, ur: NEGATIVE mg/dL
Nitrite: NEGATIVE
Protein, ur: NEGATIVE mg/dL
Specific Gravity, Urine: 1.018 (ref 1.005–1.030)
pH: 7 (ref 5.0–8.0)

## 2021-08-30 LAB — COMPREHENSIVE METABOLIC PANEL
ALT: 24 U/L (ref 0–44)
AST: 24 U/L (ref 15–41)
Albumin: 3.9 g/dL (ref 3.5–5.0)
Alkaline Phosphatase: 95 U/L (ref 38–126)
Anion gap: 6 (ref 5–15)
BUN: 13 mg/dL (ref 6–20)
CO2: 27 mmol/L (ref 22–32)
Calcium: 9 mg/dL (ref 8.9–10.3)
Chloride: 107 mmol/L (ref 98–111)
Creatinine, Ser: 0.73 mg/dL (ref 0.44–1.00)
GFR, Estimated: >60 mL/min (ref >60)
Glucose, Bld: 104 mg/dL — ABNORMAL HIGH (ref 70–99)
Potassium: 3.9 mmol/L (ref 3.5–5.1)
Sodium: 140 mmol/L (ref 135–145)
Total Bilirubin: 0.4 mg/dL (ref 0.3–1.2)
Total Protein: 7.8 g/dL (ref 6.5–8.1)

## 2021-08-30 LAB — CBC
HCT: 41.5 % (ref 36.0–46.0)
Hemoglobin: 13.5 g/dL (ref 12.0–15.0)
MCH: 27.1 pg (ref 26.0–34.0)
MCHC: 32.5 g/dL (ref 30.0–36.0)
MCV: 83.3 fL (ref 80.0–100.0)
Platelets: 318 10*3/uL (ref 150–400)
RBC: 4.98 MIL/uL (ref 3.87–5.11)
RDW: 13.6 % (ref 11.5–15.5)
WBC: 8.8 10*3/uL (ref 4.0–10.5)
nRBC: 0 % (ref 0.0–0.2)

## 2021-08-30 LAB — LIPASE, BLOOD: Lipase: 30 U/L (ref 11–51)

## 2021-08-30 LAB — I-STAT BETA HCG BLOOD, ED (MC, WL, AP ONLY): I-stat hCG, quantitative: <5 m[IU]/mL (ref <5)

## 2021-08-30 MED ORDER — DICYCLOMINE HCL 20 MG PO TABS: 20.0000 mg | ORAL_TABLET | Freq: Two times a day (BID) | ORAL | 0 refills | Status: DC

## 2021-08-30 MED ORDER — DICYCLOMINE HCL 10 MG PO CAPS
10.0000 mg | ORAL_CAPSULE | Freq: Once | ORAL | Status: AC
  Administered 2021-08-30: 10 mg via ORAL
  Filled 2021-08-30: qty 1

## 2021-08-30 NOTE — Discharge Instructions (Signed)
Take Bentyl as prescribed for abdominal pain, follow up with your doctor next week for recheck, return to the ER for worsening or concerning symptoms.  ?

## 2021-08-30 NOTE — ED Provider Notes (Signed)
?[facility]-EMERGENCY DEPT ?Provider Note ? ? ?CSN: [id number] ?Arrival date & time: 08/29/21  2256 ? ?  ? ?History ? ?Chief Complaint  ?Patient presents with  ? Abdominal Pain  ? ? ?Bonnie Booth is a 21 y.o. female. ? ?21 year old female presents with complaint of abdominal pain, described as a throbbing sensation in her RUQ which has been intermittent for the past 2 weeks and more bothersome since yesterday.  Patient tried taking an antacid without improvement.  Pain is worse with lying on her right side.  Denies dysuria, urgency, frequency, changes in bowel habits, nausea, vomiting.  Currently on her menstrual cycle.  Prior abdominal surgeries include appendectomy 1 year ago.   ?Past medical history of RA, hypertension, anemia. ? ? ?  ? ?Home Medications ?Prior to Admission medications   ?Medication Sig Start Date End Date Taking? Authorizing Provider  ?dicyclomine (BENTYL) 20 MG tablet Take 1 tablet (20 mg total) by mouth 2 (two) times daily. 08/30/21   Jeannie FendMurphy, Nikolus Marczak A, PA-C  ?doxycycline (VIBRAMYCIN) 100 MG capsule Take 1 capsule (100 mg total) by mouth 2 (two) times daily. 08/04/21   Henderly, Britni A, PA-C  ?famotidine (PEPCID) 20 MG tablet Take 1 tablet (20 mg total) by mouth 2 (two) times daily. 05/19/21   Couture, Cortni S, PA-C  ?fluconazole (DIFLUCAN) 150 MG tablet Take 1 tablet (150 mg total) by mouth daily. 08/04/21   Henderly, Britni A, PA-C  ?levonorgestrel (MIRENA) 20 MCG/24HR IUD 1 each by Intrauterine route once. Jan 2021 placed    [provider]  ?enalapril (VASOTEC) 5 MG tablet Take 1 tablet (5 mg total) by mouth daily. ?Patient not taking: Reported on 03/05/2020 05/21/19 03/05/20  Shirlean MylarMahoney, Caitlin, MD  ?ferrous sulfate 325 (65 FE) MG tablet Take 1 tablet (325 mg total) by mouth every other day. ?Patient not taking: Reported on 03/05/2020 05/22/19 03/05/20  Shirlean MylarMahoney, Caitlin, MD  ?   ? ?Allergies    ?Apple juice, Bee venom, Cinnamon, Mango flavor [flavoring agent], Pineapple,  Prunus persica, Tomato, and Peach flavor   ? ?Review of Systems   ?Review of Systems ?Negative except as per HPI. ?Physical Exam ?Updated Vital Signs ?BP 136/63 (BP Location: Right Arm)   Pulse 99   Temp 99.5 ?F (37.5 ?C) (Oral)   Resp 16   Ht 5\' 6"  (1.676 m)   Wt 96.6 kg   SpO2 98%   BMI 34.38 kg/m?  ?Physical Exam ?Vitals and nursing note reviewed.  ?Constitutional:   ?   General: She is not in acute distress. ?   Appearance: She is well-developed. She is not diaphoretic.  ?HENT:  ?   Head: Normocephalic and atraumatic.  ?Cardiovascular:  ?   Rate and Rhythm: Normal rate and regular rhythm.  ?   Heart sounds: Normal heart sounds.  ?Pulmonary:  ?   Effort: Pulmonary effort is normal.  ?   Breath sounds: Normal breath sounds.  ?Abdominal:  ?   Palpations: Abdomen is soft.  ?   Tenderness: There is abdominal tenderness in the right upper quadrant, epigastric area and periumbilical area. Negative signs include Jazmina Muhlenkamp's sign.  ?   Hernia: No hernia is present.  ?Skin: ?   General: Skin is warm and dry.  ?   Findings: No erythema or rash.  ?Neurological:  ?   Mental Status: She is alert and oriented to person, place, and time.  ?Psychiatric:     ?   Behavior: Behavior normal.  ? ? ?  ED Results / Procedures / Treatments   ?Labs ?(all labs ordered are listed, but only abnormal results are displayed) ?Labs Reviewed  ?COMPREHENSIVE METABOLIC PANEL - Abnormal; Notable for the following components:  ?    Result Value  ? Glucose, Bld 104 (*)   ? All other components within normal limits  ?URINALYSIS, ROUTINE W REFLEX MICROSCOPIC - Abnormal; Notable for the following components:  ? Hgb urine dipstick MODERATE (*)   ? Leukocytes,Ua LARGE (*)   ? All other components within normal limits  ?LIPASE, BLOOD  ?CBC  ?I-STAT BETA HCG BLOOD, ED ([facility], [facility], [facility] ONLY)  ? ? ?EKG ?None ? ?Radiology ?No results found. ? ?Procedures ?Procedures  ? ? ?Medications Ordered in ED ?Medications  ?dicyclomine (BENTYL) capsule 10 mg (10 mg Oral  Given 08/30/21 0127)  ? ? ?ED Course/ Medical Decision Making/ A&P ?  ?                        ?Medical Decision Making ?Amount and/or Complexity of Data Reviewed ?Labs: ordered. ? ?Risk ?Prescription drug management. ? ? ?This patient presents to the ED for concern of right upper abdominal pain, this involves an extensive number of treatment options, and is a complaint that carries with it a high risk of complications and morbidity.  The differential diagnosis includes cholelithiasis, acute cholecystitis, choledocholithiasis, pancreatitis, hepatitis, gastritis, constipation ? ? ?Co morbidities that complicate the patient evaluation ? ?RA (not on DMARD), anemia, hypertension ? ? ?Additional history obtained: ? ?External records from outside source obtained and reviewed including prior CT abdomen/pelvis and US obtained 05/2020-present, prior studies all negative for gallbladder sludge or stones, did have 1 to 2 mm left renal stone on prior study which patient states she passed ? ? ?Lab Tests: ? ?I Ordered, and personally interpreted labs.  The pertinent results include: CBC with normal white blood cell count, normal H&H.  CMP with normal renal and hepatic function, normal electrolytes.  Lipase within normal meds, hCG negative.  Urinalysis with moderate hemoglobin, large leukocytes, denies dysuria, is on menstrual cycle. ? ? ?Problem List / ED Course / Critical interventions / Medication management ? ?21 year old female with right upper quadrant abdominal pain as above.  On exam, has mild right upper quadrant tenderness as well as tenderness over epigastric and periumbilical areas, negative Martavious Hartel sign.  Prior imaging reviewed, negative for stones and sludge previously.  Labs reassuring.  Patient is prescribed Bentyl and referred to PCP for recheck. ?I ordered medication including Bentyl for abdominal pain ?Reevaluation of the patient after these medicines showed that the patient  given at time of discharge ?I have  reviewed the patients home medicines and have made adjustments as needed ? ? ?Social Determinants of Health: ? ?Has PCP for follow-up care ? ? ?Test / Admission - Considered: ? ?Considered CT scan versus ultrasound, prior studies reviewed.  Has had 3 CTs abdomen pelvis in the past 15 months, in light of reassuring exam and labs, will defer imaging at this time however reconsider if pain persists. ? ? ? ? ? ? ? ? ?Final Clinical Impression(s) / ED Diagnoses ?Final diagnoses:  ?Generalized abdominal pain  ? ? ?Rx / DC Orders ?ED Discharge Orders   ? ?      Ordered  ?  dicyclomine (BENTYL) 20 MG tablet  2 times daily,   Status:  Discontinued       ? 08/30/21 0113  ?  dicyclomine (BENTYL) 20 MG tablet  2 times daily       ? 08/30/21 0117  ? ?  ?  ? ?  ? ? ?  ?Jeannie Fend, PA-C ?08/30/21 0132 ? ?  ?Tilden Fossa, MD ?08/30/21 6803 ? ?

## 2021-09-09 ENCOUNTER — Ambulatory Visit (HOSPITAL_COMMUNITY)
Admission: EM | Admit: 2021-09-09 | Discharge: 2021-09-09 | Disposition: A | Discharge status: Home / Self Care | Payer: Medicaid Other | Attending: Sports Medicine | Admitting: Sports Medicine

## 2021-09-09 ENCOUNTER — Encounter (HOSPITAL_COMMUNITY): Payer: Self-pay

## 2021-09-09 DIAGNOSIS — H7292 Unspecified perforation of tympanic membrane, left ear: Secondary | ICD-10-CM

## 2021-09-09 NOTE — ED Provider Notes (Signed)
?[facility] ? ? ? ?CSN: [id number] ?Arrival date & time: 09/09/21  1018 ? ? ?  ? ?History   ?Chief Complaint ?Chief Complaint  ?Patient presents with  ? Ear Pain  ? ? ?HPI ?Bonnie Booth is a 21 y.o. female here for left ear pain and pressure. ? ?HPI ? ?Over one week ago was sick - congestion, fever/chills ?Unsure what she was sick with, but thought to be viral etiology ?Sickness was present for one week but now feeling better ? ?About 3 days ago she was blowing her nose somewhat forcefully when she felt a sharp pain and pressure in the left ear.  Since this incident she has continued with left otalgia.  Reports left ear pressure. Feels like "water" in the ear. Diminshed hearing in the left ear.  She denies any redness or discharge coming out of the ear.  She denies any fever or chills.  History of prior ear recurrent infections, no tubes as a child.  She still does have residual nasal congestion, but otherwise she is feeling well.  No chest pain, shortness of breath.  No neck pain. ? ?Past Medical History:  ?Diagnosis Date  ? Anemia   ? Hypertension   ? Rheumatoid arthritis (HCC)   ? ? ?Patient Active Problem List  ? Diagnosis Date Noted  ? 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 Positive ANA (antinuclear antibody) 11/22/2020  ? 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 Appendicitis 06/20/2020  ? Acute appendicitis 06/20/2020  ? IUD (intrauterine device) in place 05/26/2019  ? Perineal laceration, second degree 05/20/2019  ? Normal spontaneous vaginal delivery 05/20/2019  ? Labor and delivery, indication for care 05/19/2019  ? Preeclampsia 05/19/2019  ? Gestational diabetes 03/16/2019  ? Alpha thalassemia silent carrier 12/02/2018  ? Supervision of other normal pregnancy, antepartum 11/04/2018  ? Personal history of arthritis 01/17/2013  ? Panic disorder 01/17/2013  ? Adverse food reaction 01/17/2013  ? ? ?Past Surgical History:  ?Procedure Laterality  Date  ? APPENDECTOMY  05/2020  ? LAPAROSCOPIC APPENDECTOMY N/A 06/21/2020  ? Procedure: APPENDECTOMY LAPAROSCOPIC;  Surgeon: Quentin Ore, MD;  Location: [facility];  Service: General;  Laterality: N/A;  ? NO PAST SURGERIES    ? ? ?OB History   ? ? Gravida  ?3  ? Para  ?1  ? Term  ?1  ? Preterm  ?   ? AB  ?2  ? Living  ?1  ?  ? ? SAB  ?2  ? IAB  ?   ? Ectopic  ?   ? Multiple  ?0  ? Live Births  ?1  ?   ?  ?  ? ? ? ?Home Medications   ? ?Prior to Admission medications   ?Medication Sig Start Date End Date Taking? Authorizing Provider  ?dicyclomine (BENTYL) 20 MG tablet Take 1 tablet (20 mg total) by mouth 2 (two) times daily. 08/30/21   Jeannie Fend, PA-C  ?doxycycline (VIBRAMYCIN) 100 MG capsule Take 1 capsule (100 mg total) by mouth 2 (two) times daily. 08/04/21   Henderly, Britni A, PA-C  ?famotidine (PEPCID) 20 MG tablet Take 1 tablet (20 mg total) by mouth 2 (two) times daily. 05/19/21   Couture, Cortni S, PA-C  ?fluconazole (DIFLUCAN) 150 MG tablet Take 1 tablet (150 mg total) by mouth daily. 08/04/21   Henderly, Britni A, PA-C  ?levonorgestrel (MIRENA) 20 MCG/24HR IUD 1 each by Intrauterine route once. Jan 2021 placed    [provider]  ?enalapril (VASOTEC) 5 MG tablet Take 1 tablet (5 mg total) by mouth daily. ?Patient not taking: Reported on 03/05/2020 05/21/19 03/05/20  Shirlean Mylar, MD  ?ferrous sulfate 325 (65 FE) MG tablet Take 1 tablet (325 mg total) by mouth every other day. ?Patient not taking: Reported on 03/05/2020 05/22/19 03/05/20  Shirlean Mylar, MD  ? ? ?Family History ?Family History  ?Problem Relation Age of Onset  ? Healthy Son   ? ? ?Social History ?Social History  ? ?Tobacco Use  ? Smoking status: Never  ? Smokeless tobacco: Never  ?Vaping Use  ? Vaping Use: Never used  ?Substance Use Topics  ? Alcohol use: No  ? Drug use: Not Currently  ? ? ? ?Allergies   ?Apple juice, Bee venom, Cinnamon, Mango flavor [flavoring agent], Pineapple, Prunus persica, Tomato, and Peach  flavor ? ? ?Review of Systems ?Review of Systems  ?Constitutional:  Negative for chills and fever.  ?HENT:  Positive for congestion and ear pain. Negative for ear discharge and sinus pressure.   ?Respiratory:  Negative for cough, shortness of breath and wheezing.   ?Cardiovascular:  Negative for chest pain.  ?Neurological:  Negative for dizziness and headaches.  ? ? ?Physical Exam ?Triage Vital Signs ?ED Triage Vitals [09/09/21 1114]  ?Enc Vitals Group  ?   BP 111/73  ?   Pulse Rate 91  ?   Resp 16  ?   Temp 98.1 ?F (36.7 ?C)  ?   Temp Source Oral  ?   SpO2 100 %  ?   Weight   ?   Height   ?   Head Circumference   ?   Peak Flow   ?   Pain Score   ?   Pain Loc   ?   Pain Edu?   ?   Excl. in GC?   ? ?No data found. ? ?Updated Vital Signs ?BP 111/73 (BP Location: Right Arm)   Pulse 91   Temp 98.1 ?F (36.7 ?C) (Oral)   Resp 16   SpO2 100%  ? ? ?Physical Exam ?Constitutional:   ?   General: She is not in acute distress. ?   Appearance: Normal appearance. She is not toxic-appearing.  ?HENT:  ?   Head: Normocephalic and atraumatic.  ?   Right Ear: Tympanic membrane and external ear normal.  ?   Ears:  ?   Comments: + perforation of left tympanic membrane, spans approx 20% of membrane; position: 9-11 o'clock. ?No evidence of discharge, no redness of TM or ear canal ?Cardiovascular:  ?   Rate and Rhythm: Normal rate.  ?Pulmonary:  ?   Effort: Pulmonary effort is normal. No respiratory distress.  ?Musculoskeletal:  ?   Cervical back: Normal range of motion.  ?Lymphadenopathy:  ?   Cervical: Cervical adenopathy present.  ?Skin: ?   Capillary Refill: Capillary refill takes less than 2 seconds.  ?Neurological:  ?   Mental Status: She is alert.  ?Psychiatric:     ?   Mood and Affect: Mood normal.     ?   Thought Content: Thought content normal.  ? ? ? ?UC Treatments / Results  ?Labs ?(all labs ordered are listed, but only abnormal results are displayed) ?Labs Reviewed - No data to display ? ?EKG ? ? ?Radiology ?No results  found. ? ?Procedures ?Procedures (including critical care time) ? ?Medications Ordered in UC ?Medications - No data to display ? ?Initial Impression / Assessment and Plan /  UC Course  ?I have reviewed the triage vital signs and the nursing notes. ? ?Pertinent labs & imaging results that were available during my care of the patient were reviewed by me and considered in my medical decision making (see chart for details). ? ?  ? ?Left tympanic membrane perforation - without any discharge or signs of ear infection.  Likely from forceful blowing of nose 3 days ago.  Discussed the etiology of her condition and how this is treated with supportive symptomatic care.  Advised against swimming, submerging the ear underwater.  She is to be careful with showering, may use cotton ball to avoid excessive water going into the ear.  Discussed not cleaning out the ears.  Discussed that there is no role for eardrops at this time as the ear is not infected.  Would expect this to get better over the coming weeks to month, if it is not improved did provide information for local ENT for patient to follow-up with.  If fever chills develop, worsening ear pain or redness/discharge she is to present here for further evaluation. ? ?Final Clinical Impressions(s) / UC Diagnoses  ? ?Final diagnoses:  ?Tympanic membrane perforation, left  ? ?Discharge Instructions   ?None ?  ? ?ED Prescriptions   ?None ?  ? ?PDMP not reviewed this encounter. ?  Madelyn Brunner, DO ?09/09/21 1220 ? ?

## 2021-09-09 NOTE — ED Triage Notes (Signed)
C/o left pain and pressure x 2-3 days.  ?

## 2021-09-26 ENCOUNTER — Encounter (HOSPITAL_BASED_OUTPATIENT_CLINIC_OR_DEPARTMENT_OTHER): Payer: Self-pay | Admitting: *Deleted

## 2021-09-26 ENCOUNTER — Other Ambulatory Visit: Payer: Self-pay

## 2021-09-26 ENCOUNTER — Emergency Department (HOSPITAL_BASED_OUTPATIENT_CLINIC_OR_DEPARTMENT_OTHER)
Admission: EM | Admit: 2021-09-26 | Discharge: 2021-09-27 | Disposition: A | Discharge status: Home / Self Care | Payer: Medicaid Other | Attending: Emergency Medicine | Admitting: Emergency Medicine

## 2021-09-26 DIAGNOSIS — R42 Dizziness and giddiness: Secondary | ICD-10-CM | POA: Diagnosis not present

## 2021-09-26 DIAGNOSIS — R5382 Chronic fatigue, unspecified: Secondary | ICD-10-CM | POA: Diagnosis not present

## 2021-09-26 DIAGNOSIS — I10 Essential (primary) hypertension: Secondary | ICD-10-CM | POA: Diagnosis not present

## 2021-09-26 DIAGNOSIS — R5381 Other malaise: Secondary | ICD-10-CM | POA: Diagnosis not present

## 2021-09-26 LAB — URINALYSIS, ROUTINE W REFLEX MICROSCOPIC
Bilirubin Urine: NEGATIVE
Glucose, UA: NEGATIVE mg/dL
Hgb urine dipstick: NEGATIVE
Ketones, ur: NEGATIVE mg/dL
Leukocytes,Ua: NEGATIVE
Nitrite: NEGATIVE
Protein, ur: NEGATIVE mg/dL
Specific Gravity, Urine: <1.005 — ABNORMAL LOW (ref 1.005–1.030)
pH: 6 (ref 5.0–8.0)

## 2021-09-26 LAB — CBC
HCT: 44 % (ref 36.0–46.0)
Hemoglobin: 13.6 g/dL (ref 12.0–15.0)
MCH: 24.8 pg — ABNORMAL LOW (ref 26.0–34.0)
MCHC: 30.9 g/dL (ref 30.0–36.0)
MCV: 80.3 fL (ref 80.0–100.0)
Platelets: 275 10*3/uL (ref 150–400)
RBC: 5.48 MIL/uL — ABNORMAL HIGH (ref 3.87–5.11)
RDW: 13.4 % (ref 11.5–15.5)
WBC: 6 10*3/uL (ref 4.0–10.5)
nRBC: 0 % (ref 0.0–0.2)

## 2021-09-26 LAB — PREGNANCY, URINE: Preg Test, Ur: NEGATIVE

## 2021-09-26 NOTE — ED Triage Notes (Addendum)
Pt reports that she has been having some dizziness with headaches since Saturday and she was concerned that she might be diabetic.  She states that she changed her diet and began eating healthier.  She states that EMS came out to her house this pm because she was feeling dizzy and they evaluated her and found her CBG to be normal and her BP to be elevated. Pt also reports some abdominal burning and frequent urination.

## 2021-09-27 LAB — COMPREHENSIVE METABOLIC PANEL
ALT: 21 U/L (ref 0–44)
AST: 33 U/L (ref 15–41)
Albumin: 4.6 g/dL (ref 3.5–5.0)
Alkaline Phosphatase: 82 U/L (ref 38–126)
Anion gap: 9 (ref 5–15)
BUN: 17 mg/dL (ref 6–20)
CO2: 28 mmol/L (ref 22–32)
Calcium: 10.4 mg/dL — ABNORMAL HIGH (ref 8.9–10.3)
Chloride: 98 mmol/L (ref 98–111)
Creatinine, Ser: 0.89 mg/dL (ref 0.44–1.00)
GFR, Estimated: >60 mL/min (ref >60)
Glucose, Bld: 98 mg/dL (ref 70–99)
Potassium: 4.3 mmol/L (ref 3.5–5.1)
Sodium: 135 mmol/L (ref 135–145)
Total Bilirubin: 0.5 mg/dL (ref 0.3–1.2)
Total Protein: 8.9 g/dL — ABNORMAL HIGH (ref 6.5–8.1)

## 2021-09-27 LAB — LIPASE, BLOOD: Lipase: 46 U/L (ref 11–51)

## 2021-09-27 NOTE — ED Provider Notes (Signed)
[facility] Provider Note MRN:  [id number]  Arrival date & time: 09/27/21     Chief Complaint   multiple complaints   History of Present Illness   Bonnie Booth is a 21 y.o. year-old female with a history of hypertension presenting to the ED with chief complaint of multiple complaints.  Patient having some lightheadedness, malaise, fatigue, muscle soreness.  Started working out again and was using a sweat bag the other day.  Review of Systems  A thorough review of systems was obtained and all systems are negative except as noted in the HPI and PMH.   Patient's Health History    Past Medical History:  Diagnosis Date   Anemia    Hypertension    Rheumatoid arthritis (HCC)     Past Surgical History:  Procedure Laterality Date   APPENDECTOMY  05/2020   LAPAROSCOPIC APPENDECTOMY N/A 06/21/2020   Procedure: APPENDECTOMY LAPAROSCOPIC;  Surgeon: Quentin OreStechschulte, Paul J, MD;  Location: [facility];  Service: General;  Laterality: N/A;   NO PAST SURGERIES      Family History  Problem Relation Age of Onset   Healthy Son     Social History   Socioeconomic History   Marital status: Single    Spouse name: Not on file   Number of children: Not on file   Years of education: Not on file   Highest education level: Not on file  Occupational History   Not on file  Tobacco Use   Smoking status: Never   Smokeless tobacco: Never  Vaping Use   Vaping Use: Never used  Substance and Sexual Activity   Alcohol use: No   Drug use: Not Currently   Sexual activity: Not on file  Other Topics Concern   Not on file  Social History Narrative   Not on file   Social Determinants of Health   Financial Resource Strain: Not on file  Food Insecurity: Not on file  Transportation Needs: Not on file  Physical Activity: Not on file  Stress: Not on file  Social Connections: Not on file  Intimate Partner Violence: Not on file     Physical Exam    Vitals:   09/26/21 2227  BP: (!) 145/101  Pulse: 92  Resp: 16  Temp: 98.2 F (36.8 C)  SpO2: 100%    CONSTITUTIONAL: Well-appearing, NAD NEURO/PSYCH:  Alert and oriented x 3, no focal deficits EYES:  eyes equal and reactive ENT/NECK:  no LAD, no JVD CARDIO: Regular rate, well-perfused, normal S1 and S2 PULM:  CTAB no wheezing or rhonchi GI/GU:  non-distended, non-tender MSK/SPINE:  No gross deformities, no edema SKIN:  no rash, atraumatic   *Additional and/or pertinent findings included in MDM below  Diagnostic and Interventional Summary    EKG Interpretation  Date/Time:  Thursday September 26 2021 22:47:14 EDT Ventricular Rate:  88 PR Interval:  154 QRS Duration: 74 QT Interval:  350 QTC Calculation: 423 R Axis:   45 Text Interpretation: Normal sinus rhythm T wave abnormality, consider inferior ischemia Abnormal ECG When compared with ECG of 21-Feb-2019 17:10, PREVIOUS ECG IS PRESENT Confirmed by Kennis CarinaBero, Alamin Mccuiston [phone]([id number]) on 09/27/2021 12:31:41 AM       Labs Reviewed  COMPREHENSIVE METABOLIC PANEL - Abnormal; Notable for the following components:      Result Value   Calcium 10.4 (*)    Total Protein 8.9 (*)    All other components within normal limits  CBC - Abnormal; Notable for the following components:  RBC 5.48 (*)    MCH 24.8 (*)    All other components within normal limits  URINALYSIS, ROUTINE W REFLEX MICROSCOPIC - Abnormal; Notable for the following components:   Color, Urine COLORLESS (*)    Specific Gravity, Urine <1.005 (*)    All other components within normal limits  LIPASE, BLOOD  PREGNANCY, URINE    No orders to display    Medications - No data to display   Procedures  /  Critical Care Procedures  ED Course and Medical Decision Making  Initial Impression and Ddx Multiple complaints, lightheadedness, malaise, fatigue.  Suspect dehydration.  Labs are reassuring with no significant blood count or electrolyte disturbance, no AKI, doubt  rhabdomyolysis given the patient does not have any significant muscle tenderness in his well-appearing and ambulatory without issue.  No chest pain or shortness of breath, nothing to suggest a cardiopulmonary process appropriate for discharge with reassurance, instructions to drink more water.  Past medical/surgical history that increases complexity of ED encounter: None  Interpretation of Diagnostics I personally reviewed the EKG and my interpretation is as follows: Sinus rhythm, nonspecific T wave changes compared to 2 years ago    Patient Reassessment and Ultimate Disposition/Management     Discharge  Patient management required discussion with the following services or consulting groups:  None  Complexity of Problems Addressed Acute illness or injury that poses threat of life of bodily function  Additional Data Reviewed and Analyzed Further history obtained from: Further history from spouse/family member  Additional Factors Impacting ED Encounter Risk None  Elmer Sow. Pilar Plate, MD [facility] Emergency Medicine [facility] [email or web address]  Final Clinical Impressions(s) / ED Diagnoses     ICD-10-CM   1. Lightheadedness  R42       ED Discharge Orders     None        Discharge Instructions Discussed with and Provided to Patient:    Discharge Instructions      You were evaluated in the Emergency Department and after careful evaluation, we did not find any emergent condition requiring admission or further testing in the hospital.  Your exam/testing today is overall reassuring.  Symptoms likely related to dehydration.  Recommend increasing your fluid intake and following up closely with your primary care doctor to discuss your blood pressure.  Please return to the Emergency Department if you experience any worsening of your condition.   Thank you for allowing [place] to be a part of your care.      Sabas Sous, MD 09/27/21 [phone]

## 2021-09-27 NOTE — ED Notes (Signed)
Pt agreeable with d/c plan as discussed by provider- this nurse has verbally reinforced d/c instructions and provided pt with written copy- pt acknowledges verbal understanding and denies any additional questions, concerns, needs- ambulatory independently at d/c with steady gait; vitals stable; no distress.  

## 2021-09-27 NOTE — Discharge Instructions (Signed)
You were evaluated in the Emergency Department and after careful evaluation, we did not find any emergent condition requiring admission or further testing in the hospital.  Your exam/testing today is overall reassuring.  Symptoms likely related to dehydration.  Recommend increasing your fluid intake and following up closely with your primary care doctor to discuss your blood pressure.  Please return to the Emergency Department if you experience any worsening of your condition.   Thank you for allowing [place] to be a part of your care.

## 2021-09-29 ENCOUNTER — Other Ambulatory Visit: Payer: Self-pay

## 2021-09-29 ENCOUNTER — Encounter (HOSPITAL_COMMUNITY): Payer: Self-pay

## 2021-09-29 ENCOUNTER — Emergency Department (HOSPITAL_COMMUNITY)
Admission: EM | Admit: 2021-09-29 | Discharge: 2021-09-29 | Discharge status: Against Medical Advice | Payer: Medicaid Other | Attending: Emergency Medicine | Admitting: Emergency Medicine

## 2021-09-29 DIAGNOSIS — R109 Unspecified abdominal pain: Secondary | ICD-10-CM | POA: Diagnosis present

## 2021-09-29 DIAGNOSIS — Z5321 Procedure and treatment not carried out due to patient leaving prior to being seen by health care provider: Secondary | ICD-10-CM | POA: Diagnosis not present

## 2021-09-29 LAB — URINALYSIS, ROUTINE W REFLEX MICROSCOPIC
Bilirubin Urine: NEGATIVE
Glucose, UA: NEGATIVE mg/dL
Ketones, ur: NEGATIVE mg/dL
Nitrite: NEGATIVE
Protein, ur: NEGATIVE mg/dL
Specific Gravity, Urine: 1.009 (ref 1.005–1.030)
pH: 5 (ref 5.0–8.0)

## 2021-09-29 LAB — PREGNANCY, URINE: Preg Test, Ur: NEGATIVE

## 2021-09-29 NOTE — ED Triage Notes (Signed)
Patient BIB [facility]. Said she is having right sided flank pain for a week. No pain while urinating.

## 2021-09-30 ENCOUNTER — Emergency Department (HOSPITAL_BASED_OUTPATIENT_CLINIC_OR_DEPARTMENT_OTHER)
Admission: EM | Admit: 2021-09-30 | Discharge: 2021-09-30 | Disposition: A | Discharge status: Home / Self Care | Payer: Medicaid Other | Attending: Emergency Medicine | Admitting: Emergency Medicine

## 2021-09-30 ENCOUNTER — Emergency Department (HOSPITAL_BASED_OUTPATIENT_CLINIC_OR_DEPARTMENT_OTHER): Payer: Medicaid Other

## 2021-09-30 ENCOUNTER — Encounter (HOSPITAL_BASED_OUTPATIENT_CLINIC_OR_DEPARTMENT_OTHER): Payer: Self-pay

## 2021-09-30 DIAGNOSIS — Z79899 Other long term (current) drug therapy: Secondary | ICD-10-CM | POA: Diagnosis not present

## 2021-09-30 DIAGNOSIS — R109 Unspecified abdominal pain: Secondary | ICD-10-CM | POA: Diagnosis present

## 2021-09-30 DIAGNOSIS — I10 Essential (primary) hypertension: Secondary | ICD-10-CM | POA: Insufficient documentation

## 2021-09-30 HISTORY — DX: Calculus of kidney: N20.0

## 2021-09-30 LAB — CBC WITH DIFFERENTIAL/PLATELET
Abs Immature Granulocytes: 0.02 10*3/uL (ref 0.00–0.07)
Basophils Absolute: 0 10*3/uL (ref 0.0–0.1)
Basophils Relative: 0 %
Eosinophils Absolute: 0.3 10*3/uL (ref 0.0–0.5)
Eosinophils Relative: 4 %
HCT: 42.8 % (ref 36.0–46.0)
Hemoglobin: 13.1 g/dL (ref 12.0–15.0)
Immature Granulocytes: 0 %
Lymphocytes Relative: 41 %
Lymphs Abs: 3.1 10*3/uL (ref 0.7–4.0)
MCH: 24.9 pg — ABNORMAL LOW (ref 26.0–34.0)
MCHC: 30.6 g/dL (ref 30.0–36.0)
MCV: 81.2 fL (ref 80.0–100.0)
Monocytes Absolute: 0.3 10*3/uL (ref 0.1–1.0)
Monocytes Relative: 4 %
Neutro Abs: 3.7 10*3/uL (ref 1.7–7.7)
Neutrophils Relative %: 51 %
Platelets: 336 10*3/uL (ref 150–400)
RBC: 5.27 MIL/uL — ABNORMAL HIGH (ref 3.87–5.11)
RDW: 13.5 % (ref 11.5–15.5)
WBC: 7.4 10*3/uL (ref 4.0–10.5)
nRBC: 0 % (ref 0.0–0.2)

## 2021-09-30 LAB — URINALYSIS, ROUTINE W REFLEX MICROSCOPIC
Bilirubin Urine: NEGATIVE
Glucose, UA: NEGATIVE mg/dL
Hgb urine dipstick: NEGATIVE
Ketones, ur: NEGATIVE mg/dL
Leukocytes,Ua: NEGATIVE
Nitrite: NEGATIVE
Protein, ur: NEGATIVE mg/dL
Specific Gravity, Urine: 1.017 (ref 1.005–1.030)
pH: 6 (ref 5.0–8.0)

## 2021-09-30 LAB — COMPREHENSIVE METABOLIC PANEL
ALT: 18 U/L (ref 0–44)
AST: 19 U/L (ref 15–41)
Albumin: 4.5 g/dL (ref 3.5–5.0)
Alkaline Phosphatase: 77 U/L (ref 38–126)
Anion gap: 7 (ref 5–15)
BUN: 16 mg/dL (ref 6–20)
CO2: 29 mmol/L (ref 22–32)
Calcium: 9.6 mg/dL (ref 8.9–10.3)
Chloride: 103 mmol/L (ref 98–111)
Creatinine, Ser: 0.82 mg/dL (ref 0.44–1.00)
GFR, Estimated: >60 mL/min (ref >60)
Glucose, Bld: 100 mg/dL — ABNORMAL HIGH (ref 70–99)
Potassium: 3.8 mmol/L (ref 3.5–5.1)
Sodium: 139 mmol/L (ref 135–145)
Total Bilirubin: 0.4 mg/dL (ref 0.3–1.2)
Total Protein: 8.5 g/dL — ABNORMAL HIGH (ref 6.5–8.1)

## 2021-09-30 MED ORDER — KETOROLAC TROMETHAMINE 15 MG/ML IJ SOLN
15.0000 mg | Freq: Once | INTRAMUSCULAR | Status: AC
  Administered 2021-09-30: 15 mg via INTRAVENOUS
  Filled 2021-09-30: qty 1

## 2021-09-30 NOTE — ED Provider Notes (Signed)
[facility] EMERGENCY DEPT Provider Note   CSN: [id number] Arrival date & time: 09/30/21  0026     History  Chief Complaint  Patient presents with   Flank Pain    Bonnie Booth is a 21 y.o. female.  HPI     This is a 21 year old female who presents with flank pain.  Patient states she has right-sided back and flank pain.  She states that pain has increased.  She has been taking over-the-counter medications with minimal results.  She reports history of kidney stones in the past.  No nausea or vomiting.  No hematuria or dysuria.  She has recently been experiencing lightheadedness.  She was seen and evaluated yesterday for the same.  Chart review.  She was seen for abdominal pain in early May and lightheadedness on June 1.   Home Medications Prior to Admission medications   Medication Sig Start Date End Date Taking? Authorizing Provider  dicyclomine (BENTYL) 20 MG tablet Take 1 tablet (20 mg total) by mouth 2 (two) times daily. 08/30/21   Jeannie Fend, PA-C  doxycycline (VIBRAMYCIN) 100 MG capsule Take 1 capsule (100 mg total) by mouth 2 (two) times daily. 08/04/21   Henderly, Britni A, PA-C  famotidine (PEPCID) 20 MG tablet Take 1 tablet (20 mg total) by mouth 2 (two) times daily. 05/19/21   Couture, Cortni S, PA-C  fluconazole (DIFLUCAN) 150 MG tablet Take 1 tablet (150 mg total) by mouth daily. 08/04/21   Henderly, Britni A, PA-C  levonorgestrel (MIRENA) 20 MCG/24HR IUD 1 each by Intrauterine route once. Jan 2021 placed    [provider]  enalapril (VASOTEC) 5 MG tablet Take 1 tablet (5 mg total) by mouth daily. Patient not taking: Reported on 03/05/2020 05/21/19 03/05/20  Shirlean Mylar, MD  ferrous sulfate 325 (65 FE) MG tablet Take 1 tablet (325 mg total) by mouth every other day. Patient not taking: Reported on 03/05/2020 05/22/19 03/05/20  Shirlean Mylar, MD      Allergies    Apple juice, Bee venom, Cinnamon, Mango flavor [flavoring agent], Pineapple,  Prunus persica, Tomato, and Peach flavor    Review of Systems   Review of Systems  Genitourinary:  Positive for flank pain. Negative for dysuria and hematuria.  All other systems reviewed and are negative.  Physical Exam Updated Vital Signs BP (!) 105/55   Pulse 73   Temp 98.2 F (36.8 C) (Oral)   Resp 17   Ht 1.702 m ( )   Wt 99.8 kg   SpO2 99%   BMI 34.46 kg/m  Physical Exam Vitals and nursing note reviewed.  Constitutional:      Appearance: She is well-developed. She is obese. She is not ill-appearing.  HENT:     Head: Normocephalic and atraumatic.  Eyes:     Pupils: Pupils are equal, round, and reactive to light.  Cardiovascular:     Rate and Rhythm: Normal rate and regular rhythm.     Heart sounds: Normal heart sounds.  Pulmonary:     Effort: Pulmonary effort is normal. No respiratory distress.     Breath sounds: No wheezing.  Abdominal:     General: Bowel sounds are normal.     Palpations: Abdomen is soft.     Tenderness: There is no right CVA tenderness or left CVA tenderness.  Musculoskeletal:     Cervical back: Neck supple.  Skin:    General: Skin is warm and dry.  Neurological:     Mental Status: She  is alert and oriented to person, place, and time.  Psychiatric:        Mood and Affect: Mood normal.    ED Results / Procedures / Treatments   Labs (all labs ordered are listed, but only abnormal results are displayed) Labs Reviewed  CBC WITH DIFFERENTIAL/PLATELET - Abnormal; Notable for the following components:      Result Value   RBC 5.27 (*)    MCH 24.9 (*)    All other components within normal limits  COMPREHENSIVE METABOLIC PANEL - Abnormal; Notable for the following components:   Glucose, Bld 100 (*)    Total Protein 8.5 (*)    All other components within normal limits  URINALYSIS, ROUTINE W REFLEX MICROSCOPIC    EKG None  Radiology CT Renal Stone Study  Result Date: 09/30/2021 CLINICAL DATA:  21 year old female with right flank  pain. Left upper pole renal calculus in January. EXAM: CT ABDOMEN AND PELVIS WITHOUT CONTRAST TECHNIQUE: Multidetector CT imaging of the abdomen and pelvis was performed following the standard protocol without IV contrast. RADIATION DOSE REDUCTION: This exam was performed according to the departmental dose-optimization program which includes automated exposure control, adjustment of the mA and/or kV according to patient size and/or use of iterative reconstruction technique. COMPARISON:  CT Abdomen and Pelvis 05/19/2021. FINDINGS: Lower chest: Negative. Hepatobiliary: Negative noncontrast liver and gallbladder. Pancreas: Negative. Spleen: Negative. Adrenals/Urinary Tract: Normal adrenal glands. Noncontrast kidneys appear nonobstructed, symmetric. No nephrolithiasis visible today. No pararenal inflammation. Both proximal ureters appear decompressed and normal. Stable urinary bladder, diminutive. Stomach/Bowel: Appendectomy on series 2, image 71. Negative large bowel aside from retained stool. Terminal ileum appears negative. No dilated small bowel. Negative noncontrast stomach and duodenum. No free air or free fluid. Vascular/Lymphatic: Normal caliber abdominal aorta. No calcified atherosclerosis or lymphadenopathy identified. Reproductive: Stable noncontrast appearance, with chronically rotated configuration of the IUD, T arms projecting into the ventral and dorsal myometrium as before (sagittal images 51 through 54). Other: No pelvic free fluid. Musculoskeletal: No acute osseous abnormality identified. Subtle chronic retrolisthesis of L5 on S1. IMPRESSION: 1. No acute or inflammatory process identified in the noncontrast abdomen or pelvis. No urinary calculus identified today. No obstructive uropathy. 2. Chronically rotated position of the IUD is stable. Electronically Signed   By: Odessa Fleming M.D.   On: 09/30/2021 04:13    Procedures Procedures    Medications Ordered in ED Medications  ketorolac (TORADOL) 15  MG/ML injection 15 mg (15 mg Intravenous Given 09/30/21 2458)    ED Course/ Medical Decision Making/ A&P                           Medical Decision Making Amount and/or Complexity of Data Reviewed Labs: ordered. Radiology: ordered.  Risk Prescription drug management.   This patient presents to the ED for concern of flank and back pain, this involves an extensive number of treatment options, and is a complaint that carries with it a high risk of complications and morbidity.  I considered the following differential and admission for this acute, potentially life threatening condition.  The differential diagnosis includes kidney stone, pyelonephritis, musculoskeletal etiology  MDM:    Patient presents with flank and back pain.  History of kidney stones.  She is nontoxic and vital signs are reassuring.  No reported reproducible pain on exam and exam is fairly benign.  Labs obtained.  No significant leukocytosis.  No metabolic derangements.  Urinalysis is reassuring.  CT stone study  shows no evidence of acute stones or abnormalities otherwise.  Given her age and otherwise generally benign exam, question musculoskeletal etiology.  Recommend ibuprofen  (Labs, imaging, consults)  Labs: I Ordered, and personally interpreted labs.  The pertinent results include: CBC, BMP, urinalysis  Imaging Studies ordered: I ordered imaging studies including CT stone study I independently visualized and interpreted imaging. I agree with the radiologist interpretation  Additional history obtained from friend at bedside.  External records from outside source obtained and reviewed including prior evaluations  Cardiac Monitoring: The patient was maintained on a cardiac monitor.  I personally viewed and interpreted the cardiac monitored which showed an underlying rhythm of: Normal sinus rhythm  Reevaluation: After the interventions noted above, I reevaluated the patient and found that they have  :improved  Social Determinants of Health: Lives independently  Disposition: Discharge  Co morbidities that complicate the patient evaluation  Past Medical History:  Diagnosis Date   Anemia    Hypertension    Kidney stones    Rheumatoid arthritis (HCC)      Medicines Meds ordered this encounter  Medications   ketorolac (TORADOL) 15 MG/ML injection 15 mg    I have reviewed the patients home medicines and have made adjustments as needed  Problem List / ED Course: Problem List Items Addressed This Visit   None Visit Diagnoses     Flank pain    -  Primary                   Final Clinical Impression(s) / ED Diagnoses Final diagnoses:  Flank pain    Rx / DC Orders ED Discharge Orders     None         Shon BatonHorton, Ericson Nafziger F, MD 09/30/21 [phone]

## 2021-09-30 NOTE — Discharge Instructions (Signed)
You were seen today for right back and flank pain.  Your work-up was reassuring.  This may be musculoskeletal in nature.  Take ibuprofen as needed for pain.

## 2021-09-30 NOTE — ED Triage Notes (Signed)
Recently seen here for rt. Sided flank pain, has not been scanned per pt. Pain has increased Denies nausea or vomiting Hx of kidney stones

## 2021-09-30 NOTE — ED Notes (Signed)
Pt verbalizes understanding of discharge instructions. Opportunity for questioning and answers were provided. Pt discharged from ED to home with family.    

## 2021-10-01 ENCOUNTER — Ambulatory Visit
Admission: EM | Admit: 2021-10-01 | Discharge: 2021-10-01 | Disposition: A | Discharge status: Home / Self Care | Payer: Medicaid Other | Attending: Student | Admitting: Student

## 2021-10-01 DIAGNOSIS — R0982 Postnasal drip: Secondary | ICD-10-CM | POA: Diagnosis not present

## 2021-10-01 MED ORDER — TRIAMCINOLONE ACETONIDE 55 MCG/ACT NA AERO: 2.0000 | INHALATION_SPRAY | Freq: Every day | NASAL | 1 refills | Status: DC

## 2021-10-01 MED ORDER — CETIRIZINE HCL 10 MG PO TABS: 10.0000 mg | ORAL_TABLET | Freq: Every day | ORAL | 1 refills | Status: DC

## 2021-10-01 NOTE — ED Provider Notes (Signed)
[facility]    CSN: [id number] Arrival date & time: 10/01/21  1536      History   Chief Complaint Chief Complaint  Patient presents with   Sore Throat    HPI Bonnie Booth is a 21 y.o. female presenting with sore throat at night for few days. Recently with new female partner and requesting oropharyngeal STI swab. She is feeling well otherwise. Denies hematuria, dysuria, frequency, urgency, back pain, n/v/d/abd pain, fevers/chills, abdnormal vaginal discharge. IUD contraception.   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   Appendicitis 06/20/2020   Acute appendicitis 06/20/2020   IUD (intrauterine device) in place 05/26/2019   Perineal laceration, second degree 05/20/2019   Normal spontaneous vaginal delivery 05/20/2019   Labor and delivery, indication for care 05/19/2019   Preeclampsia 05/19/2019   Gestational diabetes 03/16/2019   Alpha thalassemia silent carrier 12/02/2018   Supervision of other normal pregnancy, antepartum 11/04/2018   Personal history of arthritis 01/17/2013   Panic disorder 01/17/2013   Adverse food reaction 01/17/2013    Past Surgical History:  Procedure Laterality Date   APPENDECTOMY  05/2020   LAPAROSCOPIC APPENDECTOMY N/A 06/21/2020   Procedure: APPENDECTOMY LAPAROSCOPIC;  Surgeon: Quentin Ore, MD;  Location: [facility]S;  Service: General;  Laterality: N/A;   NO PAST SURGERIES      OB History     Gravida  3   Para  1   Term  1   Preterm      AB  2   Living  1      SAB  2   IAB      Ectopic      Multiple  0   Live Births  1            Home Medications    Prior to Admission  medications   Medication Sig Start Date End Date Taking? Authorizing Provider  cetirizine (ZYRTEC ALLERGY) 10 MG tablet Take 1 tablet (10 mg total) by mouth daily. 10/01/21  Yes Rhys Martini, PA-C  triamcinolone (NASACORT) 55 MCG/ACT AERO nasal inhaler Place 2 sprays into the nose daily. 10/01/21  Yes Rhys Martini, PA-C  dicyclomine (BENTYL) 20 MG tablet Take 1 tablet (20 mg total) by mouth 2 (two) times daily. 08/30/21   Jeannie Fend, PA-C  famotidine (PEPCID) 20 MG tablet Take 1 tablet (20 mg total) by mouth 2 (two) times daily. 05/19/21   Couture, Cortni S, PA-C  levonorgestrel (MIRENA) 20 MCG/24HR IUD 1 each by Intrauterine route once. Jan 2021 placed    [provider]  enalapril (VASOTEC) 5 MG tablet Take 1 tablet (5 mg total) by mouth daily. Patient not taking: Reported on 03/05/2020 05/21/19 03/05/20  Shirlean Mylar, MD  ferrous sulfate 325 (65 FE) MG tablet Take 1 tablet (325 mg total) by mouth every other day. Patient not taking: Reported on 03/05/2020 05/22/19 03/05/20  Shirlean Mylar, MD    Family History Family History  Problem Relation Age of Onset   Healthy Son     Social History Social History   Tobacco Use   Smoking status: Never   Smokeless tobacco: Never  Vaping Use  Vaping Use: Never used  Substance Use Topics   Alcohol use: No   Drug use: Not Currently     Allergies   Apple juice, Bee venom, Cinnamon, Mango flavor [flavoring agent], Pineapple, Prunus persica, Tomato, and Peach flavor   Review of Systems Review of Systems  Constitutional:  Negative for appetite change, chills and fever.  HENT:  Positive for sore throat. Negative for congestion, ear pain, rhinorrhea, sinus pressure and sinus pain.   Eyes:  Negative for redness and visual disturbance.  Respiratory:  Negative for cough, chest tightness, shortness of breath and wheezing.   Cardiovascular:  Negative for chest pain and palpitations.  Gastrointestinal:  Negative for abdominal pain,  constipation, diarrhea, nausea and vomiting.  Genitourinary:  Negative for dysuria, frequency and urgency.  Musculoskeletal:  Negative for myalgias.  Neurological:  Negative for dizziness, weakness and headaches.  Psychiatric/Behavioral:  Negative for confusion.   All other systems reviewed and are negative.   Physical Exam Triage Vital Signs ED Triage Vitals  Enc Vitals Group     BP 10/01/21 1658 108/76     Pulse Rate 10/01/21 1658 69     Resp 10/01/21 1658 18     Temp 10/01/21 1658 97.9 F (36.6 C)     Temp Source 10/01/21 1658 Oral     SpO2 10/01/21 1658 98 %     Weight --      Height --      Head Circumference --      Peak Flow --      Pain Score 10/01/21 1657 4     Pain Loc --      Pain Edu? --      Excl. in GC? --    No data found.  Updated Vital Signs BP 108/76 (BP Location: Left Arm)   Pulse 69   Temp 97.9 F (36.6 C) (Oral)   Resp 18   SpO2 98%   Visual Acuity Right Eye Distance:   Left Eye Distance:   Bilateral Distance:    Right Eye Near:   Left Eye Near:    Bilateral Near:     Physical Exam Vitals reviewed.  Constitutional:      Appearance: Normal appearance. She is not ill-appearing.  HENT:     Head: Normocephalic and atraumatic.     Right Ear: Hearing, tympanic membrane, ear canal and external ear normal. No swelling or tenderness. No middle ear effusion. There is no impacted cerumen. No mastoid tenderness. Tympanic membrane is not injected, scarred, perforated, erythematous, retracted or bulging.     Left Ear: Hearing, tympanic membrane, ear canal and external ear normal. No swelling or tenderness.  No middle ear effusion. There is no impacted cerumen. No mastoid tenderness. Tympanic membrane is not injected, scarred, perforated, erythematous, retracted or bulging.     Mouth/Throat:     Pharynx: Oropharynx is clear. No oropharyngeal exudate or posterior oropharyngeal erythema.     Comments: Tonsils are small. Cobblestoning posterior  pharynx. Cardiovascular:     Rate and Rhythm: Normal rate and regular rhythm.     Heart sounds: Normal heart sounds.  Pulmonary:     Effort: Pulmonary effort is normal.     Breath sounds: Normal breath sounds.  Lymphadenopathy:     Cervical: No cervical adenopathy.  Neurological:     General: No focal deficit present.     Mental Status: She is alert and oriented to person, place, and time.  Psychiatric:        Mood  and Affect: Mood normal.        Behavior: Behavior normal.        Thought Content: Thought content normal.        Judgment: Judgment normal.     UC Treatments / Results  Labs (all labs ordered are listed, but only abnormal results are displayed) Labs Reviewed  CERVICOVAGINAL ANCILLARY ONLY  CYTOLOGY, (ORAL, ANAL, URETHRAL) ANCILLARY ONLY    EKG   Radiology CT Renal Stone Study  Result Date: 09/30/2021 CLINICAL DATA:  21 year old female with right flank pain. Left upper pole renal calculus in January. EXAM: CT ABDOMEN AND PELVIS WITHOUT CONTRAST TECHNIQUE: Multidetector CT imaging of the abdomen and pelvis was performed following the standard protocol without IV contrast. RADIATION DOSE REDUCTION: This exam was performed according to the departmental dose-optimization program which includes automated exposure control, adjustment of the mA and/or kV according to patient size and/or use of iterative reconstruction technique. COMPARISON:  CT Abdomen and Pelvis 05/19/2021. FINDINGS: Lower chest: Negative. Hepatobiliary: Negative noncontrast liver and gallbladder. Pancreas: Negative. Spleen: Negative. Adrenals/Urinary Tract: Normal adrenal glands. Noncontrast kidneys appear nonobstructed, symmetric. No nephrolithiasis visible today. No pararenal inflammation. Both proximal ureters appear decompressed and normal. Stable urinary bladder, diminutive. Stomach/Bowel: Appendectomy on series 2, image 71. Negative large bowel aside from retained stool. Terminal ileum appears negative.  No dilated small bowel. Negative noncontrast stomach and duodenum. No free air or free fluid. Vascular/Lymphatic: Normal caliber abdominal aorta. No calcified atherosclerosis or lymphadenopathy identified. Reproductive: Stable noncontrast appearance, with chronically rotated configuration of the IUD, T arms projecting into the ventral and dorsal myometrium as before (sagittal images 51 through 54). Other: No pelvic free fluid. Musculoskeletal: No acute osseous abnormality identified. Subtle chronic retrolisthesis of L5 on S1. IMPRESSION: 1. No acute or inflammatory process identified in the noncontrast abdomen or pelvis. No urinary calculus identified today. No obstructive uropathy. 2. Chronically rotated position of the IUD is stable. Electronically Signed   By: Odessa Fleming M.D.   On: 09/30/2021 04:13    Procedures Procedures (including critical care time)  Medications Ordered in UC Medications - No data to display  Initial Impression / Assessment and Plan / UC Course  I have reviewed the triage vital signs and the nursing notes.  Pertinent labs & imaging results that were available during my care of the patient were reviewed by me and considered in my medical decision making (see chart for details).     This patient is a very pleasant 21 y.o. year old female presenting with sore throat related to PND. Afebrile, nontachy. Tonsils are small and without exudate.  Today on exam there is no asymmetry, low suspicion for deep space infection.  No evidence of bacteremia, sepsis. I collected a swab for pharyngeal G/C/trich at patient's request. Trial of zyrtec and nasacort. ED return precautions discussed. Patient verbalizes understanding and agreement.  .   Final Clinical Impressions(s) / UC Diagnoses   Final diagnoses:  Postnasal drip     Discharge Instructions      -Zyrtec and nasacort once daily  -We'll call in 2-3 days with any positive lab results    ED Prescriptions     Medication Sig  Dispense Auth. Provider   cetirizine (ZYRTEC ALLERGY) 10 MG tablet Take 1 tablet (10 mg total) by mouth daily. 30 tablet Rhys Martini, PA-C   triamcinolone (NASACORT) 55 MCG/ACT AERO nasal inhaler Place 2 sprays into the nose daily. 1 each Samuella Cota      PDMP  not reviewed this encounter.   Rhys Martini, PA-C 10/01/21 1810

## 2021-10-01 NOTE — Discharge Instructions (Addendum)
-  Zyrtec and nasacort once daily  -We'll call in 2-3 days with any positive lab results

## 2021-10-01 NOTE — ED Triage Notes (Signed)
Pt presents with sore throat X 3 days; pt believes it may be STD.

## 2021-10-02 LAB — CYTOLOGY, (ORAL, ANAL, URETHRAL) ANCILLARY ONLY
Chlamydia: NEGATIVE
Comment: NEGATIVE
Comment: NEGATIVE
Comment: NORMAL
Neisseria Gonorrhea: NEGATIVE
Trichomonas: NEGATIVE

## 2022-01-06 ENCOUNTER — Ambulatory Visit (HOSPITAL_COMMUNITY)
Admission: EM | Admit: 2022-01-06 | Discharge: 2022-01-06 | Discharge status: Absent without leave | Payer: Medicaid Other

## 2022-01-07 ENCOUNTER — Ambulatory Visit (HOSPITAL_COMMUNITY): Payer: Medicaid Other

## 2022-01-09 ENCOUNTER — Ambulatory Visit (HOSPITAL_COMMUNITY)
Admission: RE | Admit: 2022-01-09 | Discharge: 2022-01-09 | Disposition: A | Discharge status: Home / Self Care | Payer: Medicaid Other | Source: Ambulatory Visit | Attending: Family Medicine | Admitting: Family Medicine

## 2022-01-09 ENCOUNTER — Encounter (HOSPITAL_COMMUNITY): Payer: Self-pay

## 2022-01-09 VITALS — BP 105/56 | HR 92 | Temp 98.5°F | Resp 16

## 2022-01-09 DIAGNOSIS — R42 Dizziness and giddiness: Secondary | ICD-10-CM | POA: Insufficient documentation

## 2022-01-09 DIAGNOSIS — R109 Unspecified abdominal pain: Secondary | ICD-10-CM | POA: Insufficient documentation

## 2022-01-09 DIAGNOSIS — N390 Urinary tract infection, site not specified: Secondary | ICD-10-CM | POA: Diagnosis present

## 2022-01-09 DIAGNOSIS — R300 Dysuria: Secondary | ICD-10-CM | POA: Diagnosis not present

## 2022-01-09 DIAGNOSIS — N898 Other specified noninflammatory disorders of vagina: Secondary | ICD-10-CM | POA: Insufficient documentation

## 2022-01-09 DIAGNOSIS — K219 Gastro-esophageal reflux disease without esophagitis: Secondary | ICD-10-CM | POA: Insufficient documentation

## 2022-01-09 DIAGNOSIS — R0602 Shortness of breath: Secondary | ICD-10-CM | POA: Insufficient documentation

## 2022-01-09 HISTORY — DX: Hepatomegaly, not elsewhere classified: R16.0

## 2022-01-09 LAB — POCT URINALYSIS DIPSTICK, ED / UC
Glucose, UA: NEGATIVE mg/dL
Nitrite: NEGATIVE
Protein, ur: NEGATIVE mg/dL
Specific Gravity, Urine: 1.025 (ref 1.005–1.030)
Urobilinogen, UA: 1 mg/dL (ref 0.0–1.0)
pH: 6 (ref 5.0–8.0)

## 2022-01-09 LAB — COMPREHENSIVE METABOLIC PANEL
ALT: 24 U/L (ref 0–44)
AST: 22 U/L (ref 15–41)
Albumin: 3.9 g/dL (ref 3.5–5.0)
Alkaline Phosphatase: 85 U/L (ref 38–126)
Anion gap: 5 (ref 5–15)
BUN: 13 mg/dL (ref 6–20)
CO2: 27 mmol/L (ref 22–32)
Calcium: 9.2 mg/dL (ref 8.9–10.3)
Chloride: 106 mmol/L (ref 98–111)
Creatinine, Ser: 0.74 mg/dL (ref 0.44–1.00)
GFR, Estimated: >60 mL/min (ref >60)
Glucose, Bld: 90 mg/dL (ref 70–99)
Potassium: 4.3 mmol/L (ref 3.5–5.1)
Sodium: 138 mmol/L (ref 135–145)
Total Bilirubin: 0.8 mg/dL (ref 0.3–1.2)
Total Protein: 8.1 g/dL (ref 6.5–8.1)

## 2022-01-09 LAB — CBC WITH DIFFERENTIAL/PLATELET
Abs Immature Granulocytes: 0.02 10*3/uL (ref 0.00–0.07)
Basophils Absolute: 0 10*3/uL (ref 0.0–0.1)
Basophils Relative: 0 %
Eosinophils Absolute: 0.1 10*3/uL (ref 0.0–0.5)
Eosinophils Relative: 1 %
HCT: 43.7 % (ref 36.0–46.0)
Hemoglobin: 13.5 g/dL (ref 12.0–15.0)
Immature Granulocytes: 0 %
Lymphocytes Relative: 37 %
Lymphs Abs: 2.7 10*3/uL (ref 0.7–4.0)
MCH: 25.5 pg — ABNORMAL LOW (ref 26.0–34.0)
MCHC: 30.9 g/dL (ref 30.0–36.0)
MCV: 82.6 fL (ref 80.0–100.0)
Monocytes Absolute: 0.5 10*3/uL (ref 0.1–1.0)
Monocytes Relative: 6 %
Neutro Abs: 3.9 10*3/uL (ref 1.7–7.7)
Neutrophils Relative %: 56 %
Platelets: 359 10*3/uL (ref 150–400)
RBC: 5.29 MIL/uL — ABNORMAL HIGH (ref 3.87–5.11)
RDW: 13.8 % (ref 11.5–15.5)
WBC: 7.2 10*3/uL (ref 4.0–10.5)
nRBC: 0 % (ref 0.0–0.2)

## 2022-01-09 MED ORDER — FAMOTIDINE 20 MG PO TABS: 20.0000 mg | ORAL_TABLET | Freq: Two times a day (BID) | ORAL | 0 refills | Status: DC

## 2022-01-09 MED ORDER — SULFAMETHOXAZOLE-TRIMETHOPRIM 800-160 MG PO TABS: 1.0000 | ORAL_TABLET | Freq: Two times a day (BID) | ORAL | 0 refills | Status: AC

## 2022-01-09 NOTE — ED Triage Notes (Signed)
Patient c/o SOB x 2 weeks.   Patient endorses SOB upon exertion and when laying down.   Patient endorses dizziness at times.   Patient has used Symbicort with no relief of symptoms.   No past history of Asthma or respiratory issues.    Patient c/o epigastric ABD pain x " months".   Patient endorses urinary frequency.   Patient denies N/V/D.  Patient endorses fatigue " and feeling like I want to faint at times".   Patient has taken Famotidine in the past for this issue but has ran out of prescription.   History of Liver Mass per patient statement.

## 2022-01-09 NOTE — Discharge Instructions (Signed)
You were seen today for multiple issues.  I have tested your urine, did blood work and a vaginal swab.  I will treat you for reflux with famotidine twice/day.  I have sent out bactrim twice/day x 7 days for possible UTI.  Your test results will be resulted by tomorrow.  If there is anything concerning or that needs to be treated we will call you.  I recommend you find a primary care provider at [email or web address].

## 2022-01-09 NOTE — ED Provider Notes (Signed)
[facility]    CSN: [id number] Arrival date & time: 01/09/22  1107      History   Chief Complaint Chief Complaint  Patient presents with   Shortness of Breath   Abdominal Pain   APPT 1100    HPI Bonnie Booth is a 21 y.o. female.   Patient is here for sob and dizziness x 2 weeks.  With exertion and laying down.  Room is not spinning, but light headedness. Also with blurry vision that comes/goes.   No h/o of asthma.  She stopped smoking about 2 weeks.   She also has swollen ankles at times.   She is also  here for epigastric abd pain.  + burping.  No n/v.  No otc meds used.  She took a pepcid without help.  That made her feel "horrible".  However she also states she was on famotidine in the past with help.   She is having some burning with urination.  Mild vaginal d/c.  She used otc monistat without help.   Has a h/o liver mass.  She took some mediation to get rid of it but never heard back about that.   She is looking for another pcp.    Past Medical History:  Diagnosis Date   Anemia    Hypertension    Kidney stones    Liver mass    Rheumatoid arthritis 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   Appendicitis 06/20/2020   Acute appendicitis 06/20/2020   IUD (intrauterine device) in place 05/26/2019   Perineal laceration, second degree 05/20/2019   Normal spontaneous vaginal delivery 05/20/2019   Labor and delivery, indication for care 05/19/2019   Preeclampsia 05/19/2019   Gestational diabetes 03/16/2019   Alpha thalassemia silent carrier 12/02/2018   Supervision of other normal pregnancy, antepartum 11/04/2018   Personal history of arthritis 01/17/2013   Panic disorder 01/17/2013   Adverse food reaction 01/17/2013    Past Surgical History:   Procedure Laterality Date   APPENDECTOMY  05/2020   LAPAROSCOPIC APPENDECTOMY N/A 06/21/2020   Procedure: APPENDECTOMY LAPAROSCOPIC;  Surgeon: Quentin Ore, MD;  Location: [facility];  Service: General;  Laterality: N/A;   NO PAST SURGERIES      OB History     Gravida  3   Para  1   Term  1   Preterm      AB  2   Living  1      SAB  2   IAB      Ectopic      Multiple  0   Live Births  1            Home Medications    Prior to Admission medications   Medication Sig Start Date End Date Taking? Authorizing Provider  cetirizine (ZYRTEC ALLERGY) 10 MG tablet Take 1 tablet (10 mg total) by mouth daily. 10/01/21   Rhys Martini, PA-C  dicyclomine (BENTYL) 20 MG tablet Take 1 tablet (20 mg total) by mouth 2 (two) times daily. 08/30/21   Jeannie Fend, PA-C  famotidine (PEPCID) 20 MG tablet Take 1 tablet (20 mg total) by mouth 2 (two) times daily. 05/19/21   Couture, Cortni S, PA-C  levonorgestrel (MIRENA) 20 MCG/24HR IUD 1 each by Intrauterine route once. Jan 2021 placed  [provider]  triamcinolone (NASACORT) 55 MCG/ACT AERO nasal inhaler Place 2 sprays into the nose daily. 10/01/21   Rhys Martini, PA-C  enalapril (VASOTEC) 5 MG tablet Take 1 tablet (5 mg total) by mouth daily. Patient not taking: Reported on 03/05/2020 05/21/19 03/05/20  Shirlean Mylar, MD  ferrous sulfate 325 (65 FE) MG tablet Take 1 tablet (325 mg total) by mouth every other day. Patient not taking: Reported on 03/05/2020 05/22/19 03/05/20  Shirlean Mylar, MD    Family History Family History  Problem Relation Age of Onset   Healthy Son     Social History Social History   Tobacco Use   Smoking status: Never   Smokeless tobacco: Never  Vaping Use   Vaping Use: Never used  Substance Use Topics   Alcohol use: No   Drug use: Not Currently     Allergies   Apple juice, Bee venom, Cinnamon, Mango flavor [flavoring agent], Pineapple, Prunus persica, Tomato, and Peach  flavor   Review of Systems Review of Systems  Constitutional:  Positive for chills. Negative for appetite change and fever.  HENT:  Positive for sore throat. Negative for congestion and rhinorrhea.   Eyes: Negative.   Respiratory:  Positive for shortness of breath. Negative for cough and wheezing.   Cardiovascular: Negative.   Gastrointestinal:  Positive for abdominal pain.  Genitourinary:  Positive for dysuria and vaginal discharge.  Musculoskeletal: Negative.   Neurological:  Positive for dizziness.  Hematological: Negative.   Psychiatric/Behavioral: Negative.       Physical Exam Triage Vital Signs ED Triage Vitals  Enc Vitals Group     BP 01/09/22 1132 (!) 105/56     Pulse Rate 01/09/22 1132 92     Resp 01/09/22 1132 16     Temp 01/09/22 1132 98.5 F (36.9 C)     Temp Source 01/09/22 1132 Oral     SpO2 01/09/22 1132 98 %     Weight --      Height --      Head Circumference --      Peak Flow --      Pain Score 01/09/22 1131 7     Pain Loc --      Pain Edu? --      Excl. in [place]? --    No data found.  Updated Vital Signs BP (!) 105/56 (BP Location: Right Arm)   Pulse 92   Temp 98.5 F (36.9 C) (Oral)   Resp 16   LMP  (LMP Unknown)   SpO2 98%   Visual Acuity Right Eye Distance:   Left Eye Distance:   Bilateral Distance:    Right Eye Near:   Left Eye Near:    Bilateral Near:     Physical Exam Constitutional:      Appearance: Normal appearance.  HENT:     Head: Normocephalic and atraumatic.     Nose: Nose normal. No congestion or rhinorrhea.     Mouth/Throat:     Mouth: Mucous membranes are moist.     Pharynx: No oropharyngeal exudate or posterior oropharyngeal erythema.  Cardiovascular:     Rate and Rhythm: Normal rate and regular rhythm.     Pulses: Normal pulses.  Pulmonary:     Effort: Pulmonary effort is normal.     Breath sounds: Normal breath sounds.  Abdominal:     Palpations: Abdomen is soft.     Tenderness: There is abdominal  tenderness. There is no guarding or rebound.  Comments: TTP at the epigastric and lower abdomen  Musculoskeletal:        General: Normal range of motion.     Cervical back: Normal range of motion and neck supple. Tenderness present.  Lymphadenopathy:     Cervical: No cervical adenopathy.  Skin:    General: Skin is warm.  Neurological:     General: No focal deficit present.     Mental Status: She is alert.  Psychiatric:        Mood and Affect: Mood normal.      UC Treatments / Results  Labs (all labs ordered are listed, but only abnormal results are displayed) Labs Reviewed  POCT URINALYSIS DIPSTICK, ED / UC - Abnormal; Notable for the following components:      Result Value   Bilirubin Urine SMALL (*)    Ketones, ur TRACE (*)    Hgb urine dipstick TRACE (*)    Leukocytes,Ua SMALL (*)    All other components within normal limits  URINE CULTURE  CBC WITH DIFFERENTIAL/PLATELET  COMPREHENSIVE METABOLIC PANEL  CERVICOVAGINAL ANCILLARY ONLY    EKG   Radiology No results found.  Procedures Procedures (including critical care time)  Medications Ordered in UC Medications - No data to display  Initial Impression / Assessment and Plan / UC Course  I have reviewed the triage vital signs and the nursing notes.  Pertinent labs & imaging results that were available during my care of the patient were reviewed by me and considered in my medical decision making (see chart for details).    Final Clinical Impressions(s) / UC Diagnoses   Final diagnoses:  Gastroesophageal reflux disease, unspecified whether esophagitis present  Dizziness  SOB (shortness of breath)  Abdominal pain, unspecified abdominal location  Dysuria  Vaginal discharge  Urinary tract infection without hematuria, site unspecified     Discharge Instructions      You were seen today for multiple issues.  I have tested your urine, did blood work and a vaginal swab.  I will treat you for reflux with  famotidine twice/day.  I have sent out bactrim twice/day x 7 days for possible UTI.  Your test results will be resulted by tomorrow.  If there is anything concerning or that needs to be treated we will call you.  I recommend you find a primary care provider at [email or web address].     ED Prescriptions     Medication Sig Dispense Auth. Provider   famotidine (PEPCID) 20 MG tablet Take 1 tablet (20 mg total) by mouth 2 (two) times daily. 60 tablet Syncere Eble, MD   sulfamethoxazole-trimethoprim (BACTRIM DS) 800-160 MG tablet Take 1 tablet by mouth 2 (two) times daily for 7 days. 14 tablet Jannifer Franklin, MD      PDMP not reviewed this encounter.   Jannifer Franklin, MD 01/09/22 1236

## 2022-01-10 LAB — CERVICOVAGINAL ANCILLARY ONLY
Bacterial Vaginitis (gardnerella): NEGATIVE
Candida Glabrata: NEGATIVE
Candida Vaginitis: NEGATIVE
Chlamydia: NEGATIVE
Comment: NEGATIVE
Comment: NEGATIVE
Comment: NEGATIVE
Comment: NEGATIVE
Comment: NEGATIVE
Comment: NORMAL
Neisseria Gonorrhea: NEGATIVE
Trichomonas: NEGATIVE

## 2022-01-10 LAB — URINE CULTURE: Culture: 2000 — AB

## 2022-02-23 ENCOUNTER — Emergency Department (HOSPITAL_COMMUNITY): Payer: Medicaid Other

## 2022-02-23 ENCOUNTER — Other Ambulatory Visit: Payer: Self-pay

## 2022-02-23 ENCOUNTER — Emergency Department (HOSPITAL_COMMUNITY)
Admission: EM | Admit: 2022-02-23 | Discharge: 2022-02-24 | Discharge status: Against Medical Advice | Payer: Medicaid Other | Attending: Student | Admitting: Student

## 2022-02-23 DIAGNOSIS — R079 Chest pain, unspecified: Secondary | ICD-10-CM | POA: Insufficient documentation

## 2022-02-23 DIAGNOSIS — R202 Paresthesia of skin: Secondary | ICD-10-CM | POA: Diagnosis not present

## 2022-02-23 DIAGNOSIS — F419 Anxiety disorder, unspecified: Secondary | ICD-10-CM | POA: Diagnosis not present

## 2022-02-23 DIAGNOSIS — Z5321 Procedure and treatment not carried out due to patient leaving prior to being seen by health care provider: Secondary | ICD-10-CM | POA: Diagnosis not present

## 2022-02-23 DIAGNOSIS — R42 Dizziness and giddiness: Secondary | ICD-10-CM | POA: Insufficient documentation

## 2022-02-23 DIAGNOSIS — R002 Palpitations: Secondary | ICD-10-CM | POA: Diagnosis not present

## 2022-02-23 LAB — CBC WITH DIFFERENTIAL/PLATELET
Abs Immature Granulocytes: 0.01 10*3/uL (ref 0.00–0.07)
Basophils Absolute: 0 10*3/uL (ref 0.0–0.1)
Basophils Relative: 1 %
Eosinophils Absolute: 0.1 10*3/uL (ref 0.0–0.5)
Eosinophils Relative: 1 %
HCT: 41.7 % (ref 36.0–46.0)
Hemoglobin: 13.2 g/dL (ref 12.0–15.0)
Immature Granulocytes: 0 %
Lymphocytes Relative: 33 %
Lymphs Abs: 2.3 10*3/uL (ref 0.7–4.0)
MCH: 26 pg (ref 26.0–34.0)
MCHC: 31.7 g/dL (ref 30.0–36.0)
MCV: 82.1 fL (ref 80.0–100.0)
Monocytes Absolute: 0.4 10*3/uL (ref 0.1–1.0)
Monocytes Relative: 6 %
Neutro Abs: 4.1 10*3/uL (ref 1.7–7.7)
Neutrophils Relative %: 59 %
Platelets: 269 10*3/uL (ref 150–400)
RBC: 5.08 MIL/uL (ref 3.87–5.11)
RDW: 14.2 % (ref 11.5–15.5)
WBC: 7 10*3/uL (ref 4.0–10.5)
nRBC: 0 % (ref 0.0–0.2)

## 2022-02-23 LAB — COMPREHENSIVE METABOLIC PANEL
ALT: 29 U/L (ref 0–44)
AST: 21 U/L (ref 15–41)
Albumin: 3.7 g/dL (ref 3.5–5.0)
Alkaline Phosphatase: 64 U/L (ref 38–126)
Anion gap: 9 (ref 5–15)
BUN: 17 mg/dL (ref 6–20)
CO2: 25 mmol/L (ref 22–32)
Calcium: 9.3 mg/dL (ref 8.9–10.3)
Chloride: 102 mmol/L (ref 98–111)
Creatinine, Ser: 0.74 mg/dL (ref 0.44–1.00)
GFR, Estimated: >60 mL/min (ref >60)
Glucose, Bld: 90 mg/dL (ref 70–99)
Potassium: 4.5 mmol/L (ref 3.5–5.1)
Sodium: 136 mmol/L (ref 135–145)
Total Bilirubin: 0.6 mg/dL (ref 0.3–1.2)
Total Protein: 7.5 g/dL (ref 6.5–8.1)

## 2022-02-23 LAB — MAGNESIUM: Magnesium: 1.9 mg/dL (ref 1.7–2.4)

## 2022-02-23 LAB — I-STAT BETA HCG BLOOD, ED (MC, WL, AP ONLY): I-stat hCG, quantitative: <5 m[IU]/mL (ref <5)

## 2022-02-23 LAB — TROPONIN I (HIGH SENSITIVITY): Troponin I (High Sensitivity): 3 ng/L (ref <18)

## 2022-02-23 LAB — TSH: TSH: 3.392 u[IU]/mL (ref 0.350–4.500)

## 2022-02-23 NOTE — ED Provider Triage Note (Signed)
Emergency Medicine Provider Triage Evaluation Note  Bonnie Booth , a 21 y.o. female  was evaluated in triage.  Pt complains of chest pain x 2 weeks. Patient reports constant pain to the central chest, feels sharp and like pressure, worse when standing up, no alleviating factors. Having associated anxiety, palpitations, lightheadedness, and paresthesias for 2 weeks as well. Recent change in diet to keto.   Review of Systems  Per above  Physical Exam  There were no vitals taken for this visit. Gen:   Awake, no distress   Resp:  Normal effort  MSK:   Moves extremities without difficulty  Other:  PERRL. No facial droop. Sensation grossly intact to light touch x4. 5/5 symmetric grip strength & strength with plantar/dorsiflexion bilaterally. No pronator drift. 2+ radial pulses. Chest wall TTP.   Medical Decision Making  Medically screening exam initiated at 10:31 PM.  Appropriate orders placed.  Bonnie Booth was informed that the remainder of the evaluation will be completed by another provider, this initial triage assessment does not replace that evaluation, and the importance of remaining in the ED until their evaluation is complete.  Chest pain Anxiety.    Bonnie Booth, [place] 02/24/22 0254

## 2022-02-23 NOTE — ED Triage Notes (Signed)
Pt here via [facility] from home for anxiety. Pt had sudden onset of anxiety today, hx of the same. Pt reports heart palpitations, numbness/tingling. Pt recently dx with pre-diabetes and has been under stress. 80HR, 100% RA, 130/80, cbg 87

## 2022-02-24 NOTE — ED Notes (Signed)
Patient called x2 for vitals recheck with no response  

## 2022-05-17 ENCOUNTER — Emergency Department (HOSPITAL_COMMUNITY)
Admission: EM | Admit: 2022-05-17 | Discharge: 2022-05-17 | Disposition: A | Discharge status: Home / Self Care | Payer: Medicaid Other | Attending: Emergency Medicine | Admitting: Emergency Medicine

## 2022-05-17 ENCOUNTER — Other Ambulatory Visit: Payer: Self-pay

## 2022-05-17 ENCOUNTER — Encounter (HOSPITAL_COMMUNITY): Payer: Self-pay | Admitting: Emergency Medicine

## 2022-05-17 ENCOUNTER — Emergency Department (HOSPITAL_COMMUNITY): Payer: Medicaid Other

## 2022-05-17 DIAGNOSIS — S61350A Open bite of right index finger with damage to nail, initial encounter: Secondary | ICD-10-CM | POA: Insufficient documentation

## 2022-05-17 DIAGNOSIS — W503XXA Accidental bite by another person, initial encounter: Secondary | ICD-10-CM

## 2022-05-17 MED ORDER — AMOXICILLIN-POT CLAVULANATE 875-125 MG PO TABS: 1.0000 | ORAL_TABLET | Freq: Two times a day (BID) | ORAL | 0 refills | Status: DC
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
## 2022-05-26 ENCOUNTER — Ambulatory Visit (HOSPITAL_COMMUNITY)
Admission: EM | Admit: 2022-05-26 | Discharge: 2022-05-26 | Disposition: A | Discharge status: Home / Self Care | Payer: Medicaid Other | Attending: Physician Assistant | Admitting: Physician Assistant

## 2022-05-26 ENCOUNTER — Encounter (HOSPITAL_COMMUNITY): Payer: Self-pay | Admitting: Emergency Medicine

## 2022-05-26 DIAGNOSIS — N760 Acute vaginitis: Secondary | ICD-10-CM

## 2022-05-26 DIAGNOSIS — B9689 Other specified bacterial agents as the cause of diseases classified elsewhere: Secondary | ICD-10-CM | POA: Diagnosis present

## 2022-05-26 MED ORDER — METRONIDAZOLE 500 MG PO TABS: 500.0000 mg | ORAL_TABLET | Freq: Two times a day (BID) | ORAL | 0 refills | Status: AC

## 2022-05-26 NOTE — ED Triage Notes (Signed)
Pt reports that she having vaginal itching and discharge for about a week. Reports was with a woman who had BV.

## 2022-05-26 NOTE — ED Provider Notes (Signed)
[facility]    CSN: [id number] Arrival date & time: 05/26/22  1921      History   Chief Complaint Chief Complaint  Patient presents with   Vaginal Itching    HPI Bonnie Booth is a 22 y.o. female.   Patient complains of 1 week of increased vaginal discharge.  She reports vaginal irritation but denies itching.  She states that her recent partner tested positive for STI she is unsure of what.  Has tried OTC yeast infection medications with minimal improvement.  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   Appendicitis 06/20/2020   Acute appendicitis 06/20/2020   IUD (intrauterine device) in place 05/26/2019   Perineal laceration, second degree 05/20/2019   Normal spontaneous vaginal delivery 05/20/2019   Labor and delivery, indication for care 05/19/2019   Preeclampsia 05/19/2019   Gestational diabetes 03/16/2019   Alpha thalassemia silent carrier 12/02/2018   Supervision of other normal pregnancy, antepartum 11/04/2018   Personal history of arthritis 01/17/2013   Panic disorder 01/17/2013   Adverse food reaction 01/17/2013    Past Surgical History:  Procedure Laterality Date   APPENDECTOMY  05/2020   LAPAROSCOPIC APPENDECTOMY N/A 06/21/2020   Procedure: APPENDECTOMY LAPAROSCOPIC;  Surgeon: Quentin Ore, MD;  Location: [facility];  Service: General;  Laterality: N/A;   NO PAST SURGERIES      OB History     Gravida  3   Para  1   Term  1   Preterm      AB  2   Living  1      SAB  2   IAB      Ectopic      Multiple  0   Live Births  1             Home Medications    Prior to Admission medications   Medication Sig Start Date End Date Taking? Authorizing Provider  metroNIDAZOLE (FLAGYL) 500 MG tablet Take 1 tablet (500 mg total) by mouth 2 (two) times daily for 7 days. 05/26/22 06/02/22 Yes Ward, Tylene Fantasia, PA-C  cetirizine (ZYRTEC ALLERGY) 10 MG tablet Take 1 tablet (10 mg total) by mouth daily. 10/01/21   Rhys Martini, PA-C  dicyclomine (BENTYL) 20 MG tablet Take 1 tablet (20 mg total) by mouth 2 (two) times daily. 08/30/21   Jeannie Fend, PA-C  famotidine (PEPCID) 20 MG tablet Take 1 tablet (20 mg total) by mouth 2 (two) times daily. 01/09/22   Piontek, Denny Peon, MD  levonorgestrel (MIRENA) 20 MCG/24HR IUD 1 each by Intrauterine route once. Jan 2021 placed    [provider]  triamcinolone (NASACORT) 55 MCG/ACT AERO nasal inhaler Place 2 sprays into the nose daily. 10/01/21   Rhys Martini, PA-C  enalapril (VASOTEC) 5 MG tablet Take 1 tablet (5 mg total) by mouth daily. Patient not taking: Reported on 03/05/2020 05/21/19 03/05/20  Shirlean Mylar, MD  ferrous sulfate 325 (65 FE) MG tablet Take 1 tablet (325 mg total) by mouth every other day. Patient not taking: Reported on 03/05/2020 05/22/19 03/05/20  Gladys Damme, MD    Family History Family History  Problem Relation Age of Onset   Healthy Son     Social History Social History   Tobacco Use   Smoking status: Never   Smokeless tobacco: Never  Vaping Use   Vaping Use: Never used  Substance Use Topics   Alcohol use: No   Drug use: Not Currently     Allergies   Apple juice, Bee venom, Cinnamon, Mango flavor [flavoring agent], Pineapple, Prunus persica, Tomato, and Peach flavor   Review of Systems Review of Systems  Constitutional:  Negative for chills and fever.  HENT:  Negative for ear pain and sore throat.   Eyes:  Negative for pain and visual disturbance.  Respiratory:  Negative for cough and shortness of breath.   Cardiovascular:  Negative for chest  pain and palpitations.  Gastrointestinal:  Negative for abdominal pain and vomiting.  Genitourinary:  Positive for vaginal discharge. Negative for dysuria, hematuria and pelvic pain.  Musculoskeletal:  Negative for arthralgias and back pain.  Skin:  Negative for color change and rash.  Neurological:  Negative for seizures and syncope.  All other systems reviewed and are negative.    Physical Exam Triage Vital Signs ED Triage Vitals [05/26/22 1931]  Enc Vitals Group     BP      Pulse      Resp      Temp      Temp src      SpO2      Weight      Height      Head Circumference      Peak Flow      Pain Score 0     Pain Loc      Pain Edu?      Excl. in [place]?    No data found.  Updated Vital Signs BP 114/63 (BP Location: Right Arm)   Pulse 81   Temp (!) 97.5 F (36.4 C) (Oral)   Resp 16   SpO2 98%   Visual Acuity Right Eye Distance:   Left Eye Distance:   Bilateral Distance:    Right Eye Near:   Left Eye Near:    Bilateral Near:     Physical Exam Vitals and nursing note reviewed.  Constitutional:      General: She is not in acute distress.    Appearance: She is well-developed.  HENT:     Head: Normocephalic and atraumatic.  Eyes:     Conjunctiva/sclera: Conjunctivae normal.  Cardiovascular:     Rate and Rhythm: Normal rate and regular rhythm.     Heart sounds: No murmur heard. Pulmonary:     Effort: Pulmonary effort is normal. No respiratory distress.     Breath sounds: Normal breath sounds.  Abdominal:     Palpations: Abdomen is soft.     Tenderness: There is no abdominal tenderness.  Musculoskeletal:        General: No swelling.     Cervical back: Neck supple.  Skin:    General: Skin is warm and dry.     Capillary Refill: Capillary refill takes less than 2 seconds.  Neurological:     Mental Status: She is alert.  Psychiatric:        Mood and Affect: Mood normal.      UC Treatments / Results  Labs (all labs ordered are listed, but only  abnormal results are displayed) Labs Reviewed  CERVICOVAGINAL ANCILLARY ONLY    EKG  Radiology No results found.  Procedures Procedures (including critical care time)  Medications Ordered in UC Medications - No data to display  Initial Impression / Assessment and Plan / UC Course  I have reviewed the triage vital signs and the nursing notes.  Pertinent labs & imaging results that were available during my care of the patient were reviewed by me and considered in my medical decision making (see chart for details).     Will treat for BV today.  Self swab done in clinic will change treatment plans based on cervicovaginal swab results.  Sex practices discussed. Final Clinical Impressions(s) / UC Diagnoses   Final diagnoses:  Bacterial vaginosis     Discharge Instructions      Take Flagyl as prescribed. Will call with test results and change treatment plan if indicated. Abstain from sexual activity until medication is completed and test results are back.     ED Prescriptions     Medication Sig Dispense Auth. Provider   metroNIDAZOLE (FLAGYL) 500 MG tablet Take 1 tablet (500 mg total) by mouth 2 (two) times daily for 7 days. 14 tablet Ward, Lenise Arena, PA-C      PDMP not reviewed this encounter.   Ward, Lenise Arena, PA-C 05/26/22 1946

## 2022-05-26 NOTE — Discharge Instructions (Addendum)
Take Flagyl as prescribed. Will call with test results and change treatment plan if indicated. Abstain from sexual activity until medication is completed and test results are back.

## 2022-05-27 LAB — CERVICOVAGINAL ANCILLARY ONLY
Bacterial Vaginitis (gardnerella): NEGATIVE
Candida Glabrata: NEGATIVE
Candida Vaginitis: POSITIVE — AB
Chlamydia: NEGATIVE
Comment: NEGATIVE
Comment: NEGATIVE
Comment: NEGATIVE
Comment: NEGATIVE
Comment: NEGATIVE
Comment: NORMAL
Neisseria Gonorrhea: NEGATIVE
Trichomonas: NEGATIVE

## 2022-05-28 ENCOUNTER — Telehealth (HOSPITAL_COMMUNITY): Payer: Self-pay | Admitting: Emergency Medicine

## 2022-05-28 MED ORDER — FLUCONAZOLE 150 MG PO TABS: 150.0000 mg | ORAL_TABLET | Freq: Once | ORAL | 0 refills | Status: AC

## 2022-07-21 ENCOUNTER — Ambulatory Visit
Admission: EM | Admit: 2022-07-21 | Discharge: 2022-07-21 | Disposition: A | Discharge status: Home / Self Care | Payer: Medicaid Other | Attending: Physician Assistant | Admitting: Physician Assistant

## 2022-07-21 DIAGNOSIS — H6692 Otitis media, unspecified, left ear: Secondary | ICD-10-CM

## 2022-07-21 MED ORDER — AMOXICILLIN 500 MG PO CAPS: 500.0000 mg | ORAL_CAPSULE | Freq: Three times a day (TID) | ORAL | 0 refills | Status: DC

## 2022-07-21 NOTE — Discharge Instructions (Addendum)
Return if any problems.

## 2022-07-21 NOTE — ED Provider Notes (Signed)
[facility]    CSN: [id number] Arrival date & time: 07/21/22  1443      History   Chief Complaint Chief Complaint  Patient presents with   Otalgia    HPI Bonnie Booth is a 22 y.o. female.   Complains of decreased hearing out of her left ear patient complains of discomfort in bilateral ears.  Denies any fever or chills she has not any cough or congestion  The history is provided by the patient. No language interpreter was used.  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/22/2020   Positive ANA (antinuclear antibody) 11/22/2020   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   Appendicitis 06/20/2020   Acute appendicitis 06/20/2020   IUD (intrauterine device) in place 05/26/2019   Perineal laceration, second degree 05/20/2019   Normal spontaneous vaginal delivery 05/20/2019   Labor and delivery, indication for care 05/19/2019   Preeclampsia 05/19/2019   Gestational diabetes 03/16/2019   Alpha thalassemia silent carrier 12/02/2018   Supervision of other normal pregnancy, antepartum 11/04/2018   Personal history of arthritis 01/17/2013   Panic disorder 01/17/2013   Adverse food reaction 01/17/2013    Past Surgical History:  Procedure Laterality Date   APPENDECTOMY  05/2020   LAPAROSCOPIC APPENDECTOMY N/A 06/21/2020   Procedure: APPENDECTOMY LAPAROSCOPIC;  Surgeon: Felicie Morn, MD;  Location: [facility];  Service: General;   Laterality: N/A;   NO PAST SURGERIES      OB History     Gravida  3   Para  1   Term  1   Preterm      AB  2   Living  1      SAB  2   IAB      Ectopic      Multiple  0   Live Births  1            Home Medications    Prior to Admission medications   Medication Sig Start Date End Date Taking? Authorizing Provider  amoxicillin (AMOXIL) 500 MG capsule Take 1 capsule (500 mg total) by mouth 3 (three) times daily. 07/21/22  Yes Fransico Meadow, PA-C  cetirizine (ZYRTEC ALLERGY) 10 MG tablet Take 1 tablet (10 mg total) by mouth daily. 10/01/21   Hazel Sams, PA-C  dicyclomine (BENTYL) 20 MG tablet Take 1 tablet (20 mg total) by mouth 2 (two) times daily. 08/30/21   Tacy Learn, PA-C  famotidine (PEPCID) 20 MG tablet Take 1 tablet (20 mg total) by mouth 2 (two) times daily. 01/09/22   Piontek, Junie Panning, MD  levonorgestrel (MIRENA) 20 MCG/24HR IUD 1 each by Intrauterine route once. Jan 2021 placed    [provider]  triamcinolone (NASACORT) 55 MCG/ACT AERO nasal inhaler Place 2 sprays into the nose daily. 10/01/21   Hazel Sams, PA-C  enalapril (VASOTEC) 5 MG tablet Take 1 tablet (5 mg total) by mouth daily. Patient not taking:  Reported on 03/05/2020 05/21/19 03/05/20  Gladys Damme, MD  ferrous sulfate 325 (65 FE) MG tablet Take 1 tablet (325 mg total) by mouth every other day. Patient not taking: Reported on 03/05/2020 05/22/19 03/05/20  Gladys Damme, MD    Family History Family History  Problem Relation Age of Onset   Healthy Son     Social History Social History   Tobacco Use   Smoking status: Never   Smokeless tobacco: Never  Vaping Use   Vaping Use: Never used  Substance Use Topics   Alcohol use: No   Drug use: Not Currently     Allergies   Apple juice, Bee venom, Cinnamon, Mango flavor [flavoring agent], Pineapple, Prunus persica, Tomato, and Peach flavor   Review of Systems Review of Systems  HENT:  Positive for ear pain.    Skin:  Negative for rash.  All other systems reviewed and are negative.    Physical Exam Triage Vital Signs ED Triage Vitals [07/21/22 1618]  Enc Vitals Group     BP 120/75     Pulse Rate 84     Resp 16     Temp 98.5 F (36.9 C)     Temp Source Oral     SpO2 98 %     Weight      Height      Head Circumference      Peak Flow      Pain Score 8     Pain Loc      Pain Edu?      Excl. in [place]?    No data found.  Updated Vital Signs BP 120/75 (BP Location: Left Arm)   Pulse 84   Temp 98.5 F (36.9 C) (Oral)   Resp 16   SpO2 98%   Visual Acuity Right Eye Distance:   Left Eye Distance:   Bilateral Distance:    Right Eye Near:   Left Eye Near:    Bilateral Near:     Physical Exam Vitals and nursing note reviewed.  Constitutional:      Appearance: She is well-developed.  HENT:     Head: Normocephalic.     Ears:     Comments: Left TM erythematous slight bulging poor landmarks  Eyes:     Pupils: Pupils are equal, round, and reactive to light.  Cardiovascular:     Rate and Rhythm: Normal rate.  Pulmonary:     Effort: Pulmonary effort is normal.  Abdominal:     General: There is no distension.  Musculoskeletal:        General: Normal range of motion.     Cervical back: Normal range of motion.  Skin:    General: Skin is warm.  Neurological:     Mental Status: She is alert and oriented to person, place, and time.  Psychiatric:        Mood and Affect: Mood normal.      UC Treatments / Results  Labs (all labs ordered are listed, but only abnormal results are displayed) Labs Reviewed - No data to display  EKG   Radiology No results found.  Procedures Procedures (including critical care time)  Medications Ordered in UC Medications - No data to display  Initial Impression / Assessment and Plan / UC Course  I have reviewed the triage vital signs and the nursing notes.  Pertinent labs & imaging results that were available during my care of the  patient were reviewed by me and considered in my medical  decision making (see chart for details).      Final Clinical Impressions(s) / UC Diagnoses   Final diagnoses:  Left otitis media, unspecified otitis media type     Discharge Instructions      Return if any problems.    ED Prescriptions     Medication Sig Dispense Auth. Provider   amoxicillin (AMOXIL) 500 MG capsule Take 1 capsule (500 mg total) by mouth 3 (three) times daily. 30 capsule Fransico Meadow, [place]      PDMP not reviewed this encounter. An After Visit Summary was printed and given to the patient.    Fransico Meadow, [place] 07/21/22 1642

## 2022-07-21 NOTE — ED Triage Notes (Signed)
Pt c/o left otalgia and decreased hearing that started ~ 2 days ago and now is having some discomfort in the right ear.

## 2022-07-25 ENCOUNTER — Emergency Department (HOSPITAL_BASED_OUTPATIENT_CLINIC_OR_DEPARTMENT_OTHER)
Admission: EM | Admit: 2022-07-25 | Discharge: 2022-07-25 | Disposition: A | Discharge status: Home / Self Care | Payer: Medicaid Other | Attending: Emergency Medicine | Admitting: Emergency Medicine

## 2022-07-25 ENCOUNTER — Encounter (HOSPITAL_BASED_OUTPATIENT_CLINIC_OR_DEPARTMENT_OTHER): Payer: Self-pay | Admitting: Emergency Medicine

## 2022-07-25 ENCOUNTER — Other Ambulatory Visit: Payer: Self-pay

## 2022-07-25 DIAGNOSIS — R112 Nausea with vomiting, unspecified: Secondary | ICD-10-CM | POA: Insufficient documentation

## 2022-07-25 DIAGNOSIS — Z20822 Contact with and (suspected) exposure to covid-19: Secondary | ICD-10-CM | POA: Diagnosis not present

## 2022-07-25 DIAGNOSIS — R1084 Generalized abdominal pain: Secondary | ICD-10-CM | POA: Insufficient documentation

## 2022-07-25 DIAGNOSIS — R197 Diarrhea, unspecified: Secondary | ICD-10-CM | POA: Insufficient documentation

## 2022-07-25 DIAGNOSIS — K529 Noninfective gastroenteritis and colitis, unspecified: Secondary | ICD-10-CM

## 2022-07-25 LAB — URINALYSIS, ROUTINE W REFLEX MICROSCOPIC
Bilirubin Urine: NEGATIVE
Glucose, UA: NEGATIVE mg/dL
Hgb urine dipstick: NEGATIVE
Ketones, ur: NEGATIVE mg/dL
Leukocytes,Ua: NEGATIVE
Nitrite: NEGATIVE
Protein, ur: 30 mg/dL — AB
Specific Gravity, Urine: 1.036 — ABNORMAL HIGH (ref 1.005–1.030)
pH: 5.5 (ref 5.0–8.0)

## 2022-07-25 LAB — RESP PANEL BY RT-PCR (RSV, FLU A&B, COVID)  RVPGX2
Influenza A by PCR: NEGATIVE
Influenza B by PCR: NEGATIVE
Resp Syncytial Virus by PCR: NEGATIVE
SARS Coronavirus 2 by RT PCR: NEGATIVE

## 2022-07-25 LAB — COMPREHENSIVE METABOLIC PANEL
ALT: 27 U/L (ref 0–44)
AST: 22 U/L (ref 15–41)
Albumin: 4.3 g/dL (ref 3.5–5.0)
Alkaline Phosphatase: 72 U/L (ref 38–126)
Anion gap: 10 (ref 5–15)
BUN: 24 mg/dL — ABNORMAL HIGH (ref 6–20)
CO2: 25 mmol/L (ref 22–32)
Calcium: 9.8 mg/dL (ref 8.9–10.3)
Chloride: 103 mmol/L (ref 98–111)
Creatinine, Ser: 0.79 mg/dL (ref 0.44–1.00)
GFR, Estimated: >60 mL/min (ref >60)
Glucose, Bld: 128 mg/dL — ABNORMAL HIGH (ref 70–99)
Potassium: 3.7 mmol/L (ref 3.5–5.1)
Sodium: 138 mmol/L (ref 135–145)
Total Bilirubin: 0.4 mg/dL (ref 0.3–1.2)
Total Protein: 8.7 g/dL — ABNORMAL HIGH (ref 6.5–8.1)

## 2022-07-25 LAB — CBC
HCT: 46.6 % — ABNORMAL HIGH (ref 36.0–46.0)
Hemoglobin: 14.8 g/dL (ref 12.0–15.0)
MCH: 26.2 pg (ref 26.0–34.0)
MCHC: 31.8 g/dL (ref 30.0–36.0)
MCV: 82.5 fL (ref 80.0–100.0)
Platelets: 330 10*3/uL (ref 150–400)
RBC: 5.65 MIL/uL — ABNORMAL HIGH (ref 3.87–5.11)
RDW: 13.7 % (ref 11.5–15.5)
WBC: 11.4 10*3/uL — ABNORMAL HIGH (ref 4.0–10.5)
nRBC: 0 % (ref 0.0–0.2)

## 2022-07-25 LAB — PREGNANCY, URINE: Preg Test, Ur: NEGATIVE

## 2022-07-25 LAB — LIPASE, BLOOD: Lipase: 50 U/L (ref 11–51)

## 2022-07-25 MED ORDER — SODIUM CHLORIDE 0.9 % IV BOLUS
1000.0000 mL | Freq: Once | INTRAVENOUS | Status: AC
  Administered 2022-07-25: 1000 mL via INTRAVENOUS

## 2022-07-25 MED ORDER — ONDANSETRON HCL 4 MG/2ML IJ SOLN
4.0000 mg | Freq: Once | INTRAMUSCULAR | Status: AC
  Administered 2022-07-25: 4 mg via INTRAVENOUS
  Filled 2022-07-25: qty 2

## 2022-07-25 MED ORDER — KETOROLAC TROMETHAMINE 30 MG/ML IJ SOLN
30.0000 mg | Freq: Once | INTRAMUSCULAR | Status: AC
  Administered 2022-07-25: 30 mg via INTRAVENOUS

## 2022-07-25 MED ORDER — ONDANSETRON 4 MG PO TBDP
4.0000 mg | ORAL_TABLET | Freq: Once | ORAL | Status: AC
  Administered 2022-07-25: 4 mg via ORAL
  Filled 2022-07-25: qty 1

## 2022-07-25 MED ORDER — ONDANSETRON 8 MG PO TBDP: ORAL_TABLET | ORAL | 0 refills | Status: DC

## 2022-07-25 NOTE — ED Provider Notes (Signed)
[facility] Provider Note   CSN: [id number] Arrival date & time: 07/25/22  [id number]     History  Chief Complaint  Patient presents with   Emesis    Bonnie Booth is a 22 y.o. female.  Patient is a 22 year old female with no significant past medical history.  Patient presenting today for evaluation of nausea, vomiting, and diarrhea.  This started acutely approximately 2 hours prior to presentation.  She denies any fevers or chills.  She denies any ill contacts or having consumed any suspicious foods.  No aggravating or alleviating factors.  The history is provided by the patient.       Home Medications Prior to Admission medications   Medication Sig Start Date End Date Taking? Authorizing Provider  amoxicillin (AMOXIL) 500 MG capsule Take 1 capsule (500 mg total) by mouth 3 (three) times daily. 07/21/22   Fransico Meadow, PA-C  cetirizine (ZYRTEC ALLERGY) 10 MG tablet Take 1 tablet (10 mg total) by mouth daily. 10/01/21   Hazel Sams, PA-C  dicyclomine (BENTYL) 20 MG tablet Take 1 tablet (20 mg total) by mouth 2 (two) times daily. 08/30/21   Tacy Learn, PA-C  famotidine (PEPCID) 20 MG tablet Take 1 tablet (20 mg total) by mouth 2 (two) times daily. 01/09/22   Piontek, Junie Panning, MD  levonorgestrel (MIRENA) 20 MCG/24HR IUD 1 each by Intrauterine route once. Jan 2021 placed    [provider]  triamcinolone (NASACORT) 55 MCG/ACT AERO nasal inhaler Place 2 sprays into the nose daily. 10/01/21   Hazel Sams, PA-C  enalapril (VASOTEC) 5 MG tablet Take 1 tablet (5 mg total) by mouth daily. Patient not taking: Reported on 03/05/2020 05/21/19 03/05/20  Gladys Damme, MD  ferrous sulfate 325 (65 FE) MG tablet Take 1 tablet (325 mg total) by mouth every other day. Patient not taking: Reported on 03/05/2020 05/22/19 03/05/20  Gladys Damme, MD      Allergies    Apple juice, Bee venom, Cinnamon, Mango flavor [flavoring agent], Pineapple,  Prunus persica, Tomato, and Peach flavor    Review of Systems   Review of Systems  All other systems reviewed and are negative.   Physical Exam Updated Vital Signs BP 114/60 (BP Location: Left Arm)   Pulse 97   Temp 98.6 F (37 C) (Oral)   Resp 17   SpO2 100%  Physical Exam Vitals and nursing note reviewed.  Constitutional:      General: She is not in acute distress.    Appearance: She is well-developed. She is not diaphoretic.  HENT:     Head: Normocephalic and atraumatic.  Cardiovascular:     Rate and Rhythm: Normal rate and regular rhythm.     Heart sounds: No murmur heard.    No friction rub. No gallop.  Pulmonary:     Effort: Pulmonary effort is normal. No respiratory distress.     Breath sounds: Normal breath sounds. No wheezing.  Abdominal:     General: Bowel sounds are normal. There is no distension.     Palpations: Abdomen is soft.     Tenderness: There is no abdominal tenderness.  Musculoskeletal:        General: Normal range of motion.     Cervical back: Normal range of motion and neck supple.  Skin:    General: Skin is warm and dry.  Neurological:     General: No focal deficit present.     Mental Status: She is  alert and oriented to person, place, and time.     ED Results / Procedures / Treatments   Labs (all labs ordered are listed, but only abnormal results are displayed) Labs Reviewed  COMPREHENSIVE METABOLIC PANEL - Abnormal; Notable for the following components:      Result Value   Glucose, Bld 128 (*)    BUN 24 (*)    Total Protein 8.7 (*)    All other components within normal limits  CBC - Abnormal; Notable for the following components:   WBC 11.4 (*)    RBC 5.65 (*)    HCT 46.6 (*)    All other components within normal limits  URINALYSIS, ROUTINE W REFLEX MICROSCOPIC - Abnormal; Notable for the following components:   APPearance HAZY (*)    Specific Gravity, Urine 1.036 (*)    Protein, ur 30 (*)    Bacteria, UA RARE (*)    All other  components within normal limits  RESP PANEL BY RT-PCR (RSV, FLU A&B, COVID)  RVPGX2  LIPASE, BLOOD  PREGNANCY, URINE    EKG None  Radiology No results found.  Procedures Procedures    Medications Ordered in ED Medications  ondansetron (ZOFRAN) injection 4 mg (has no administration in time range)  sodium chloride 0.9 % bolus 1,000 mL (has no administration in time range)  ketorolac (TORADOL) 30 MG/ML injection 30 mg (has no administration in time range)  ondansetron (ZOFRAN-ODT) disintegrating tablet 4 mg (4 mg Oral Given 07/25/22 0054)    ED Course/ Medical Decision Making/ A&P  Patient is a 22 year old female presenting here with nausea, vomiting, and diarrhea since earlier this evening.  She arrives here with stable vital signs and is afebrile.  Physical examination reveals mild generalized abdominal tenderness.  Workup initiated including CBC, CMP, lipase, all of which were unremarkable.  Urinalysis is clear and pregnancy test negative.  Patient has been hydrated with normal saline and given Toradol for pain and Zofran for nausea.  She is now feeling significantly improved.  Patient to be discharged.  I suspect a viral etiology or possibly a foodborne illness, but doubt any emergent pathology such as cholecystitis or appendicitis.  Final Clinical Impression(s) / ED Diagnoses Final diagnoses:  None    Rx / DC Orders ED Discharge Orders     None         Veryl Speak, MD 07/25/22 [phone]

## 2022-07-25 NOTE — ED Triage Notes (Signed)
Pt presents to ED BIB [facility] from home. Pt reports that emesis x2h. Pt reports her son has had same s/s.  EMS VS: 120/80 HR - 120  RR - 18

## 2022-07-25 NOTE — Discharge Instructions (Signed)
Begin taking Zofran as prescribed as needed for nausea.  Clear liquid diet for the next 12 hours, then slowly advance to normal as tolerated.  Return to the emergency department if you develop severe abdominal pain, high fevers, bloody stools, or for other new and concerning symptoms. 

## 2022-10-06 ENCOUNTER — Ambulatory Visit
Admission: EM | Admit: 2022-10-06 | Discharge: 2022-10-06 | Disposition: A | Discharge status: Home / Self Care | Payer: Medicaid Other | Attending: Internal Medicine | Admitting: Internal Medicine

## 2022-10-06 DIAGNOSIS — J029 Acute pharyngitis, unspecified: Secondary | ICD-10-CM | POA: Diagnosis present

## 2022-10-06 DIAGNOSIS — Z113 Encounter for screening for infections with a predominantly sexual mode of transmission: Secondary | ICD-10-CM | POA: Diagnosis present

## 2022-10-06 LAB — POCT RAPID STREP A (OFFICE): Rapid Strep A Screen: NEGATIVE

## 2022-10-06 LAB — POCT MONO SCREEN (KUC): Mono, POC: NEGATIVE

## 2022-10-06 MED ORDER — CHLORASEPTIC 1.4 % MT LIQD: 1.0000 | OROMUCOSAL | 0 refills | Status: DC | PRN

## 2022-10-06 NOTE — ED Provider Notes (Signed)
[facility]    CSN: [id number] Arrival date & time: 10/06/22  1207      History   Chief Complaint Chief Complaint  Patient presents with   Sore Throat        Exposure to STD    HPI Bonnie Booth is a 22 y.o. female.   Patient presents with 2 different chief complaints today.  Patient reports that she has had a sore throat for approximately 1 week but has worsened over the past 3 days.  Reports mild nasal congestion but denies cough or fever.  Denies any known sick contacts.  Patient has not taken any medications for symptoms.  Patient also requesting routine STD testing.  Denies any associated symptoms or any obvious exposure to STD. Last menstrual cycle is unknown.    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   Appendicitis 06/20/2020   Acute appendicitis 06/20/2020   IUD (intrauterine device) in place 05/26/2019   Perineal laceration, second degree 05/20/2019   Normal spontaneous vaginal delivery 05/20/2019   Labor and delivery, indication for care 05/19/2019   Preeclampsia 05/19/2019   Gestational diabetes 03/16/2019   Alpha thalassemia silent carrier 12/02/2018   Supervision of other normal pregnancy, antepartum 11/04/2018   Personal history of arthritis 01/17/2013   Panic disorder 01/17/2013   Adverse food reaction 01/17/2013    Past Surgical History:  Procedure Laterality Date   APPENDECTOMY  05/2020   LAPAROSCOPIC APPENDECTOMY N/A 06/21/2020   Procedure: APPENDECTOMY LAPAROSCOPIC;  Surgeon: Quentin Ore, MD;  Location: [facility];  Service: General;  Laterality: N/A;   NO PAST SURGERIES       OB History     Gravida  3   Para  1   Term  1   Preterm      AB  2   Living  1      SAB  2   IAB      Ectopic      Multiple  0   Live Births  1            Home Medications    Prior to Admission medications   Medication Sig Start Date End Date Taking? Authorizing Provider  phenol (CHLORASEPTIC) 1.4 % LIQD Use as directed 1 spray in the mouth or throat as needed for throat irritation / pain. 10/06/22  Yes Alden Bensinger, Rolly Salter E, FNP  amoxicillin (AMOXIL) 500 MG capsule Take 1 capsule (500 mg total) by mouth 3 (three) times daily. 07/21/22   Elson Areas, PA-C  cetirizine (ZYRTEC ALLERGY) 10 MG tablet Take 1 tablet (10 mg total) by mouth daily. 10/01/21   Rhys Martini, PA-C  dicyclomine (BENTYL) 20 MG tablet Take 1 tablet (20 mg total) by mouth 2 (two) times daily. 08/30/21   Jeannie Fend, PA-C  famotidine (PEPCID) 20 MG tablet Take 1 tablet (20 mg total) by mouth 2 (two) times daily. 01/09/22   Piontek, Denny Peon, MD  levonorgestrel (MIRENA) 20 MCG/24HR IUD 1 each by Intrauterine route once. Jan 2021 placed    [provider]  ondansetron (ZOFRAN-ODT) 8 MG  disintegrating tablet 8mg  ODT q4 hours prn nausea 07/25/22   Geoffery Lyons, MD  triamcinolone (NASACORT) 55 MCG/ACT AERO nasal inhaler Place 2 sprays into the nose daily. 10/01/21   Rhys Martini, PA-C  enalapril (VASOTEC) 5 MG tablet Take 1 tablet (5 mg total) by mouth daily. Patient not taking: Reported on 03/05/2020 05/21/19 03/05/20  Shirlean Mylar, MD  ferrous sulfate 325 (65 FE) MG tablet Take 1 tablet (325 mg total) by mouth every other day. Patient not taking: Reported on 03/05/2020 05/22/19 03/05/20  Shirlean Mylar, MD    Family History Family History  Problem Relation Age of Onset   Healthy Son     Social History Social History   Tobacco Use   Smoking status: Never   Smokeless tobacco: Never  Vaping Use   Vaping Use: Never used  Substance Use Topics   Alcohol use: No   Drug use: Not  Currently     Allergies   Apple, Apple juice, Bee venom, Cinnamon, Mango flavor [flavoring agent], Pineapple, Prunus persica, Tomato, and Peach flavor   Review of Systems Review of Systems Per HPI  Physical Exam Triage Vital Signs ED Triage Vitals  Enc Vitals Group     BP 10/06/22 1318 98/64     Pulse Rate 10/06/22 1318 75     Resp 10/06/22 1318 16     Temp 10/06/22 1318 98.3 F (36.8 C)     Temp Source 10/06/22 1318 Oral     SpO2 10/06/22 1318 97 %     Weight --      Height --      Head Circumference --      Peak Flow --      Pain Score 10/06/22 1323 6     Pain Loc --      Pain Edu? --      Excl. in [place]? --    No data found.  Updated Vital Signs BP 98/64 (BP Location: Left Arm)   Pulse 75   Temp 98.3 F (36.8 C) (Oral)   Resp 16   SpO2 97%   Visual Acuity Right Eye Distance:   Left Eye Distance:   Bilateral Distance:    Right Eye Near:   Left Eye Near:    Bilateral Near:     Physical Exam Constitutional:      General: She is not in acute distress.    Appearance: Normal appearance. She is not toxic-appearing or diaphoretic.  HENT:     Head: Normocephalic and atraumatic.     Right Ear: Tympanic membrane and ear canal normal.     Left Ear: Tympanic membrane and ear canal normal.     Nose: No congestion.     Mouth/Throat:     Mouth: Mucous membranes are moist.     Pharynx: Posterior oropharyngeal erythema present.  Eyes:     Extraocular Movements: Extraocular movements intact.     Conjunctiva/sclera: Conjunctivae normal.     Pupils: Pupils are equal, round, and reactive to light.  Cardiovascular:     Rate and Rhythm: Normal rate and regular rhythm.     Pulses: Normal pulses.     Heart sounds: Normal heart sounds.  Pulmonary:     Effort: Pulmonary effort is normal. No respiratory distress.     Breath sounds: Normal breath sounds. No stridor. No wheezing, rhonchi or rales.  Abdominal:     General: Abdomen is flat. Bowel sounds are normal.      Palpations: Abdomen is soft.  Genitourinary:    Comments: Deferred with shared decision making.  Self swab performed. Musculoskeletal:        General: Normal range of motion.     Cervical back: Normal range of motion.  Skin:    General: Skin is warm and dry.  Neurological:     General: No focal deficit present.     Mental Status: She is alert and oriented to person, place, and time. Mental status is at baseline.  Psychiatric:        Mood and Affect: Mood normal.        Behavior: Behavior normal.        Thought Content: Thought content normal.        Judgment: Judgment normal.      UC Treatments / Results  Labs (all labs ordered are listed, but only abnormal results are displayed) Labs Reviewed  CULTURE, GROUP A STREP ([facility])  RPR  HIV ANTIBODY (ROUTINE TESTING W REFLEX)  POCT RAPID STREP A (OFFICE)  POCT MONO SCREEN ([facility])  CERVICOVAGINAL ANCILLARY ONLY  CYTOLOGY, (ORAL, ANAL, URETHRAL) ANCILLARY ONLY    EKG   Radiology No results found.  Procedures Procedures (including critical care time)  Medications Ordered in UC Medications - No data to display  Initial Impression / Assessment and Plan / UC Course  I have reviewed the triage vital signs and the nursing notes.  Pertinent labs & imaging results that were available during my care of the patient were reviewed by me and considered in my medical decision making (see chart for details).     1.  Acute pharyngitis  Suspect viral cause to patient's sore throat.  Rapid strep and rapid mono were negative.  Throat culture pending.  Discussed with patient STD testing of the throat so cytology swab of throat is also pending.  Advised supportive care and symptom management.  Chloraseptic spray prescribed to help alleviate sore throat.  Advised to follow-up if sore throat persists or worsens.  2.  STD testing  Cervicovaginal swab, cytology swab of the throat, HIV, RPR test pending.  She denies any exposure to STD so will  await result for any further treatment.  Patient declined pregnancy testing.  Advised follow-up with any further concerns.  Patient verbalized understanding and was agreeable with plan. Final Clinical Impressions(s) / UC Diagnoses   Final diagnoses:  Sore throat  Screening examination for venereal disease     Discharge Instructions      Rapid strep and rapid mono were negative.  Throat culture and STD testing is pending.  Will call if anything is abnormal.  Follow-up if any symptoms persist or worsen.     ED Prescriptions     Medication Sig Dispense Auth. Provider   phenol (CHLORASEPTIC) 1.4 % LIQD Use as directed 1 spray in the mouth or throat as needed for throat irritation / pain. 118 mL Gustavus Bryant, [place]      PDMP not reviewed this encounter.   Gustavus Bryant, [place] 10/06/22 [phone]

## 2022-10-06 NOTE — ED Triage Notes (Signed)
Pt reports sore throat, pain when swallow x 3 days. Pt is concern for Strep.   Pt requested STD's test. Denies any symptoms.

## 2022-10-06 NOTE — Discharge Instructions (Signed)
Rapid strep and rapid mono were negative.  Throat culture and STD testing is pending.  Will call if anything is abnormal.  Follow-up if any symptoms persist or worsen.

## 2022-10-07 LAB — CERVICOVAGINAL ANCILLARY ONLY
Bacterial Vaginitis (gardnerella): NEGATIVE
Candida Glabrata: NEGATIVE
Candida Vaginitis: NEGATIVE
Chlamydia: NEGATIVE
Comment: NEGATIVE
Comment: NEGATIVE
Comment: NEGATIVE
Comment: NEGATIVE
Comment: NEGATIVE
Comment: NORMAL
Neisseria Gonorrhea: NEGATIVE
Trichomonas: NEGATIVE

## 2022-10-07 LAB — CYTOLOGY, (ORAL, ANAL, URETHRAL) ANCILLARY ONLY
Chlamydia: NEGATIVE
Comment: NEGATIVE
Comment: NORMAL
Neisseria Gonorrhea: NEGATIVE

## 2022-10-07 LAB — RPR: RPR Ser Ql: NONREACTIVE

## 2022-10-07 LAB — HIV ANTIBODY (ROUTINE TESTING W REFLEX): HIV Screen 4th Generation wRfx: NONREACTIVE

## 2022-10-08 LAB — CULTURE, GROUP A STREP (THRC)

## 2022-10-10 ENCOUNTER — Telehealth: Payer: Self-pay | Admitting: Internal Medicine

## 2022-10-10 NOTE — Telephone Encounter (Signed)
Patient called back in regards to throat culture result.  Reports that she has been having continued symptoms and sore throat.  Patient states that she had leftover amoxicillin so has taken 2 doses of 500 mg amoxicillin.  Patient states that amoxicillin is not expired and she has enough to take 500 mg twice daily for 10 days.  Therefore, advised patient to take this for continued sore throat and follow-up if any symptoms persist or worsen.

## 2022-11-10 ENCOUNTER — Emergency Department (HOSPITAL_COMMUNITY): Payer: Medicaid Other

## 2022-11-10 ENCOUNTER — Emergency Department (HOSPITAL_COMMUNITY)
Admission: EM | Admit: 2022-11-10 | Discharge: 2022-11-10 | Disposition: A | Discharge status: Home / Self Care | Payer: Medicaid Other | Attending: Emergency Medicine | Admitting: Emergency Medicine

## 2022-11-10 DIAGNOSIS — I10 Essential (primary) hypertension: Secondary | ICD-10-CM | POA: Insufficient documentation

## 2022-11-10 DIAGNOSIS — Z711 Person with feared health complaint in whom no diagnosis is made: Secondary | ICD-10-CM | POA: Insufficient documentation

## 2022-11-10 DIAGNOSIS — R0789 Other chest pain: Secondary | ICD-10-CM | POA: Diagnosis not present

## 2022-11-10 DIAGNOSIS — R42 Dizziness and giddiness: Secondary | ICD-10-CM | POA: Diagnosis present

## 2022-11-10 LAB — URINALYSIS, ROUTINE W REFLEX MICROSCOPIC
Bilirubin Urine: NEGATIVE
Glucose, UA: NEGATIVE mg/dL
Hgb urine dipstick: NEGATIVE
Ketones, ur: NEGATIVE mg/dL
Leukocytes,Ua: NEGATIVE
Nitrite: NEGATIVE
Protein, ur: NEGATIVE mg/dL
Specific Gravity, Urine: 1.01 (ref 1.005–1.030)
pH: 7 (ref 5.0–8.0)

## 2022-11-10 LAB — CBC WITH DIFFERENTIAL/PLATELET
Abs Immature Granulocytes: 0.02 10*3/uL (ref 0.00–0.07)
Basophils Absolute: 0 10*3/uL (ref 0.0–0.1)
Basophils Relative: 0 %
Eosinophils Absolute: 0.1 10*3/uL (ref 0.0–0.5)
Eosinophils Relative: 2 %
HCT: 42.8 % (ref 36.0–46.0)
Hemoglobin: 13.2 g/dL (ref 12.0–15.0)
Immature Granulocytes: 0 %
Lymphocytes Relative: 33 %
Lymphs Abs: 2.3 10*3/uL (ref 0.7–4.0)
MCH: 26.2 pg (ref 26.0–34.0)
MCHC: 30.8 g/dL (ref 30.0–36.0)
MCV: 85.1 fL (ref 80.0–100.0)
Monocytes Absolute: 0.5 10*3/uL (ref 0.1–1.0)
Monocytes Relative: 7 %
Neutro Abs: 3.9 10*3/uL (ref 1.7–7.7)
Neutrophils Relative %: 58 %
Platelets: 260 10*3/uL (ref 150–400)
RBC: 5.03 MIL/uL (ref 3.87–5.11)
RDW: 13.2 % (ref 11.5–15.5)
WBC: 6.8 10*3/uL (ref 4.0–10.5)
nRBC: 0 % (ref 0.0–0.2)

## 2022-11-10 LAB — BASIC METABOLIC PANEL
Anion gap: 7 (ref 5–15)
BUN: 16 mg/dL (ref 6–20)
CO2: 29 mmol/L (ref 22–32)
Calcium: 9.1 mg/dL (ref 8.9–10.3)
Chloride: 102 mmol/L (ref 98–111)
Creatinine, Ser: 0.86 mg/dL (ref 0.44–1.00)
GFR, Estimated: >60 mL/min (ref >60)
Glucose, Bld: 108 mg/dL — ABNORMAL HIGH (ref 70–99)
Potassium: 3.9 mmol/L (ref 3.5–5.1)
Sodium: 138 mmol/L (ref 135–145)

## 2022-11-10 LAB — PREGNANCY, URINE: Preg Test, Ur: NEGATIVE

## 2022-11-10 NOTE — Discharge Instructions (Signed)
Your ED evaluation was reassuring. Follow up with a primary care doctor.

## 2022-11-10 NOTE — ED Provider Notes (Signed)
[facility] EMERGENCY DEPARTMENT AT [facility] Provider Note   CSN: [id number] Arrival date & time: 11/10/22  0021     History  Chief Complaint  Patient presents with   Near Syncope   Chest Pain    Bonnie Booth is a 22 y.o. female.  The history is provided by the patient. No language interpreter was used.  Dizziness Quality:  Lightheadedness Severity:  Mild Timing:  Intermittent Progression:  Waxing and waning Chronicity:  Recurrent Context: not with bowel movement, not with loss of consciousness and not with medication   Relieved by:  Nothing Ineffective treatments:  None tried Associated symptoms: chest pain   Associated symptoms: no nausea, no shortness of breath, no tinnitus and no vomiting   Risk factors: anemia       Home Medications Prior to Admission medications   Medication Sig Start Date End Date Taking? Authorizing Provider  amoxicillin (AMOXIL) 500 MG capsule Take 1 capsule (500 mg total) by mouth 3 (three) times daily. 07/21/22   Elson Areas, PA-C  cetirizine (ZYRTEC ALLERGY) 10 MG tablet Take 1 tablet (10 mg total) by mouth daily. 10/01/21   Rhys Martini, PA-C  dicyclomine (BENTYL) 20 MG tablet Take 1 tablet (20 mg total) by mouth 2 (two) times daily. 08/30/21   Jeannie Fend, PA-C  famotidine (PEPCID) 20 MG tablet Take 1 tablet (20 mg total) by mouth 2 (two) times daily. 01/09/22   Piontek, Denny Peon, MD  levonorgestrel (MIRENA) 20 MCG/24HR IUD 1 each by Intrauterine route once. Jan 2021 placed    [provider]  ondansetron (ZOFRAN-ODT) 8 MG disintegrating tablet 8mg  ODT q4 hours prn nausea 07/25/22   Geoffery Lyons, MD  phenol (CHLORASEPTIC) 1.4 % LIQD Use as directed 1 spray in the mouth or throat as needed for throat irritation / pain. 10/06/22   Gustavus Bryant, FNP  triamcinolone (NASACORT) 55 MCG/ACT AERO nasal inhaler Place 2 sprays into the nose daily. 10/01/21   Rhys Martini, PA-C  enalapril (VASOTEC) 5 MG tablet Take 1 tablet  (5 mg total) by mouth daily. Patient not taking: Reported on 03/05/2020 05/21/19 03/05/20  Shirlean Mylar, MD  ferrous sulfate 325 (65 FE) MG tablet Take 1 tablet (325 mg total) by mouth every other day. Patient not taking: Reported on 03/05/2020 05/22/19 03/05/20  Shirlean Mylar, MD      Allergies    Apple, Apple juice, Bee venom, Cinnamon, Mango flavor [flavoring agent], Pineapple, Prunus persica, Tomato, and Peach flavor    Review of Systems   Review of Systems  HENT:  Negative for tinnitus.   Respiratory:  Negative for shortness of breath.   Cardiovascular:  Positive for chest pain.  Gastrointestinal:  Negative for nausea and vomiting.  Neurological:  Positive for dizziness.  Ten systems reviewed and are negative for acute change, except as noted in the HPI.    Physical Exam Updated Vital Signs BP (!) 106/59   Pulse 77   Temp 98.2 F (36.8 C) (Oral)   Resp 14   SpO2 100%   Physical Exam Vitals and nursing note reviewed.  Constitutional:      General: She is not in acute distress.    Appearance: She is well-developed. She is not diaphoretic.     Comments: Nontoxic appearing and in NAD  HENT:     Head: Normocephalic and atraumatic.  Eyes:     General: No scleral icterus.    Extraocular Movements: EOM normal.  Conjunctiva/sclera: Conjunctivae normal.  Pulmonary:     Effort: Pulmonary effort is normal. No respiratory distress.     Comments: Respirations even and unlabored. Musculoskeletal:        General: Normal range of motion.     Cervical back: Normal range of motion.     Comments: No BLE edema.   Skin:    General: Skin is warm and dry.     Coloration: Skin is not pale.     Findings: No erythema or rash.  Neurological:     Mental Status: She is alert and oriented to person, place, and time.     Coordination: Coordination normal.  Psychiatric:        Mood and Affect: Mood and affect normal.        Behavior: Behavior normal.     ED Results / Procedures /  Treatments   Labs (all labs ordered are listed, but only abnormal results are displayed) Labs Reviewed  URINALYSIS, ROUTINE W REFLEX MICROSCOPIC - Abnormal; Notable for the following components:      Result Value   Color, Urine STRAW (*)    All other components within normal limits  BASIC METABOLIC PANEL - Abnormal; Notable for the following components:   Glucose, Bld 108 (*)    All other components within normal limits  CBC WITH DIFFERENTIAL/PLATELET  PREGNANCY, URINE    EKG EKG Interpretation Date/Time:  Monday November 10 2022 00:37:20 EDT Ventricular Rate:  80 PR Interval:  169 QRS Duration:  87 QT Interval:  346 QTC Calculation: 400 R Axis:   30  Text Interpretation: Sinus rhythm Non-specific ST-t changes Confirmed by Cathren Laine ([id number]) on 11/10/2022 1:32:38 AM  Radiology DG Chest 2 View  Result Date: 11/10/2022 CLINICAL DATA:  Chest pain EXAM: CHEST - 2 VIEW COMPARISON:  None Available. FINDINGS: The heart size and mediastinal contours are within normal limits. Both lungs are clear. The visualized skeletal structures are unremarkable. IMPRESSION: No active cardiopulmonary disease. Electronically Signed   By: Helyn Numbers M.D.   On: 11/10/2022 02:14    Procedures Procedures    Medications Ordered in ED Medications - No data to display  ED Course/ Medical Decision Making/ A&P                             Medical Decision Making Amount and/or Complexity of Data Reviewed Labs: ordered. Radiology: ordered.   This patient presents to the ED for concern of dizziness, this involves an extensive number of treatment options, and is a complaint that carries with it a high risk of complications and morbidity.  The differential diagnosis includes vasovagal response vs arrhythmia vs substance use/intoxication vs hypoglycemia vs BPPV   Co morbidities that complicate the patient evaluation  HTN Anemia   Additional history obtained:  External records from outside  source obtained and reviewed including negative CXR in 01/2022   Lab Tests:  I Ordered, and personally interpreted labs.  The pertinent results include:  Glucose 108. Otherwise normal labs.   Imaging Studies ordered:  I ordered imaging studies including CXR  I independently visualized and interpreted imaging which showed no acute cardiopulmonary abnormality I agree with the radiologist interpretation   Cardiac Monitoring:  The patient was maintained on a cardiac monitor.  I personally viewed and interpreted the cardiac monitored which showed an underlying rhythm of: NSR   Medicines ordered and prescription drug management:  I have reviewed the patients home medicines and  have made adjustments as needed   Test Considered:  UDS   Problem List / ED Course:  Patient with c/o intermittent dizziness. Asymptomatic in the ED.  Physical exam and work up reassuring. Neurologic exam nonfocal. No hx of head trauma. No concern for acute blood loss anemia with stable Hgb. EKG with nonspecific ST-t wave changes, but no findings to suggest arrhythmia, acute ischemia. CBG normal. Afebrile without criteria for SIRS/sepsis. Doubt infection provoking complaint. No emergent cause identified in the ED. Stable for outpatient recheck by PCP.   Reevaluation:  After the interventions noted above, I reevaluated the patient and found that they have :stayed the same   Social Determinants of Health:  Established with PCP   Dispostion:  After consideration of the diagnostic results and the patients response to treatment, I feel that the patent would benefit from PCP f/u for recheck. Return precautions discussed and provided. Patient discharged in stable condition with no unaddressed concerns.         Final Clinical Impression(s) / ED Diagnoses Final diagnoses:  Worried well    Rx / DC Orders ED Discharge Orders     None         Antony Madura, PA-C 11/23/22 1634     Cathren Laine, MD 11/26/22 [phone]

## 2022-11-10 NOTE — ED Triage Notes (Signed)
Patient arrived with complaints of intermittent dizziness and left sided chest pain. Declines NV or shob.

## 2022-12-29 ENCOUNTER — Ambulatory Visit (HOSPITAL_COMMUNITY)
Admission: EM | Admit: 2022-12-29 | Discharge: 2022-12-29 | Disposition: A | Discharge status: Home / Self Care | Payer: Medicaid Other | Attending: Internal Medicine | Admitting: Internal Medicine

## 2022-12-29 ENCOUNTER — Encounter (HOSPITAL_COMMUNITY): Payer: Self-pay

## 2022-12-29 DIAGNOSIS — J028 Acute pharyngitis due to other specified organisms: Secondary | ICD-10-CM

## 2022-12-29 LAB — POCT RAPID STREP A (OFFICE): Rapid Strep A Screen: NEGATIVE

## 2022-12-29 MED ORDER — AMOXICILLIN 500 MG PO CAPS: 500.0000 mg | ORAL_CAPSULE | Freq: Two times a day (BID) | ORAL | 0 refills | Status: DC

## 2022-12-29 NOTE — ED Triage Notes (Signed)
Patient c/o sore throat and fever x 1 week.  Patient states she took some old Amoxicillin and states that it didn't help.

## 2022-12-29 NOTE — ED Provider Notes (Signed)
[facility]    CSN: [id number] Arrival date & time: 12/29/22  1045      History   Chief Complaint Chief Complaint  Patient presents with   Sore Throat    HPI Bonnie Booth is a 22 y.o. female.   22 year old female who presents to urgent care with 7 to 10-day history of extremely sore throat with night sweats fevers and headaches.  She also relates difficulty swallowing.  She has noticed swelling in the back of her throat with redness.  She relates that she has had fevers of "105 or 108".  She denies any congestion shortness of breath, chest pain, abdominal pain, diarrhea.  She denies any sick contacts.      Sore Throat Associated symptoms include headaches. Pertinent negatives include no chest pain, no abdominal pain and no shortness of breath.    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   Appendicitis 06/20/2020   Acute appendicitis 06/20/2020   IUD (intrauterine device) in place 05/26/2019   Perineal laceration, second degree 05/20/2019   Normal spontaneous vaginal delivery 05/20/2019   Labor and delivery, indication for care 05/19/2019   Preeclampsia 05/19/2019   Gestational diabetes 03/16/2019   Alpha thalassemia silent carrier 12/02/2018   Supervision of other normal pregnancy, antepartum 11/04/2018   Personal history of arthritis 01/17/2013   Panic disorder 01/17/2013   Adverse food reaction 01/17/2013    Past Surgical History:  Procedure Laterality Date   APPENDECTOMY  05/2020   LAPAROSCOPIC APPENDECTOMY N/A 06/21/2020   Procedure: APPENDECTOMY LAPAROSCOPIC;  Surgeon: Quentin Ore, MD;  Location: [facility];  Service:  General;  Laterality: N/A;   NO PAST SURGERIES      OB History     Gravida  3   Para  1   Term  1   Preterm      AB  2   Living  1      SAB  2   IAB      Ectopic      Multiple  0   Live Births  1            Home Medications    Prior to Admission medications   Medication Sig Start Date End Date Taking? Authorizing Provider  amoxicillin (AMOXIL) 500 MG capsule Take 1 capsule (500 mg total) by mouth 3 (three) times daily. 07/21/22   Elson Areas, PA-C  cetirizine (ZYRTEC ALLERGY) 10 MG tablet Take 1 tablet (10 mg total) by mouth daily. 10/01/21   Rhys Martini, PA-C  dicyclomine (BENTYL) 20 MG tablet Take 1 tablet (20 mg total) by mouth 2 (two) times daily. 08/30/21   Jeannie Fend, PA-C  famotidine (PEPCID) 20 MG tablet Take 1 tablet (20 mg total) by mouth 2 (two) times daily. 01/09/22   Piontek, Denny Peon, MD  levonorgestrel (MIRENA) 20 MCG/24HR IUD 1 each by Intrauterine route once. Jan 2021 placed    [provider]  ondansetron (ZOFRAN-ODT) 8 MG disintegrating tablet 8mg  ODT q4 hours prn nausea 07/25/22   Geoffery Lyons, MD  phenol (CHLORASEPTIC) 1.4 % LIQD Use as directed 1 spray in the mouth or throat  as needed for throat irritation / pain. 10/06/22   Gustavus Bryant, FNP  triamcinolone (NASACORT) 55 MCG/ACT AERO nasal inhaler Place 2 sprays into the nose daily. 10/01/21   Rhys Martini, PA-C  enalapril (VASOTEC) 5 MG tablet Take 1 tablet (5 mg total) by mouth daily. Patient not taking: Reported on 03/05/2020 05/21/19 03/05/20  Shirlean Mylar, MD  ferrous sulfate 325 (65 FE) MG tablet Take 1 tablet (325 mg total) by mouth every other day. Patient not taking: Reported on 03/05/2020 05/22/19 03/05/20  Shirlean Mylar, MD    Family History Family History  Problem Relation Age of Onset   Healthy Son     Social History Social History   Tobacco Use   Smoking status: Never   Smokeless tobacco: Never  Vaping Use   Vaping status: Never Used  Substance Use  Topics   Alcohol use: No   Drug use: Not Currently     Allergies   Apple, Apple juice, Bee venom, Cinnamon, Mango flavor [flavoring agent], Pineapple, Prunus persica, Tomato, and Peach flavor   Review of Systems Review of Systems  Constitutional:  Positive for chills and fever.  HENT:  Positive for sore throat. Negative for congestion and ear pain.   Eyes:  Negative for pain and visual disturbance.  Respiratory:  Negative for cough and shortness of breath.   Cardiovascular:  Negative for chest pain and palpitations.  Gastrointestinal:  Negative for abdominal pain and vomiting.  Genitourinary:  Negative for dysuria and hematuria.  Musculoskeletal:  Negative for arthralgias and back pain.  Skin:  Negative for color change and rash.  Neurological:  Positive for headaches. Negative for seizures and syncope.  All other systems reviewed and are negative.    Physical Exam Triage Vital Signs ED Triage Vitals  Encounter Vitals Group     BP 12/29/22 1103 110/69     Systolic BP Percentile --      Diastolic BP Percentile --      Pulse Rate 12/29/22 1103 95     Resp 12/29/22 1103 14     Temp 12/29/22 1103 98.7 F (37.1 C)     Temp Source 12/29/22 1103 Oral     SpO2 12/29/22 1103 97 %     Weight --      Height --      Head Circumference --      Peak Flow --      Pain Score 12/29/22 1106 6     Pain Loc --      Pain Education --      Exclude from Growth Chart --    No data found.  Updated Vital Signs BP 110/69 (BP Location: Left Arm)   Pulse 95   Temp 98.7 F (37.1 C) (Oral)   Resp 14   SpO2 97%   Visual Acuity Right Eye Distance:   Left Eye Distance:   Bilateral Distance:    Right Eye Near:   Left Eye Near:    Bilateral Near:     Physical Exam Vitals and nursing note reviewed.  Constitutional:      General: She is not in acute distress.    Appearance: She is well-developed.  HENT:     Head: Normocephalic and atraumatic.     Right Ear: Tympanic membrane  normal.     Left Ear: Tympanic membrane normal.     Nose: No congestion.     Mouth/Throat:     Mouth: Mucous membranes are moist.  Pharynx: Pharyngeal swelling, oropharyngeal exudate and posterior oropharyngeal erythema present.  Eyes:     Conjunctiva/sclera: Conjunctivae normal.  Cardiovascular:     Rate and Rhythm: Normal rate and regular rhythm.     Heart sounds: No murmur heard. Pulmonary:     Effort: Pulmonary effort is normal. No respiratory distress.     Breath sounds: Normal breath sounds.  Abdominal:     Palpations: Abdomen is soft.     Tenderness: There is no abdominal tenderness.  Musculoskeletal:        General: No swelling.     Cervical back: Neck supple.  Skin:    General: Skin is warm and dry.     Capillary Refill: Capillary refill takes less than 2 seconds.  Neurological:     Mental Status: She is alert.  Psychiatric:        Mood and Affect: Mood normal.      UC Treatments / Results  Labs (all labs ordered are listed, but only abnormal results are displayed) Labs Reviewed - No data to display  EKG   Radiology No results found.  Procedures Procedures (including critical care time)  Medications Ordered in UC Medications - No data to display  Initial Impression / Assessment and Plan / UC Course  I have reviewed the triage vital signs and the nursing notes.  Pertinent labs & imaging results that were available during my care of the patient were reviewed by me and considered in my medical decision making (see chart for details).     Pharyngitis due to other organism  Rapid strep test is negative however given the physical exam findings recommend started on amoxicillin 500 mg twice daily x 7 days.  Offered patient steroid injection to help with pain however she would prefer not to use this.  Advise she can use Tylenol to help with discomfort as well as ibuprofen.  She may use warm salt water gargles and drink herbal tea if she likes.  Follow-up in  urgent care if symptoms worsen or fail to improve. Final Clinical Impressions(s) / UC Diagnoses   Final diagnoses:  None   Discharge Instructions   None    ED Prescriptions   None    PDMP not reviewed this encounter.   Landis Martins, [place] 12/29/22 1153

## 2022-12-29 NOTE — Discharge Instructions (Signed)
Start amoxicillin twice a day for 7 days. Take with food. Stay hydrated. May use warm salt water gargle. Tea is ok. Follow up if symptoms worsen or fail to improve. Ok to use tylenol or ibuprofen for pain.

## 2023-02-03 ENCOUNTER — Other Ambulatory Visit: Payer: Self-pay

## 2023-02-03 ENCOUNTER — Encounter (HOSPITAL_COMMUNITY): Payer: Self-pay

## 2023-02-03 ENCOUNTER — Emergency Department (HOSPITAL_COMMUNITY)
Admission: EM | Admit: 2023-02-03 | Discharge: 2023-02-03 | Discharge status: Against Medical Advice | Payer: Medicaid Other | Attending: Emergency Medicine | Admitting: Emergency Medicine

## 2023-02-03 DIAGNOSIS — Z5321 Procedure and treatment not carried out due to patient leaving prior to being seen by health care provider: Secondary | ICD-10-CM | POA: Diagnosis not present

## 2023-02-03 DIAGNOSIS — R1110 Vomiting, unspecified: Secondary | ICD-10-CM | POA: Insufficient documentation

## 2023-02-03 DIAGNOSIS — R1084 Generalized abdominal pain: Secondary | ICD-10-CM | POA: Insufficient documentation

## 2023-02-03 LAB — CBC WITH DIFFERENTIAL/PLATELET
Abs Immature Granulocytes: 0.01 10*3/uL (ref 0.00–0.07)
Basophils Absolute: 0 10*3/uL (ref 0.0–0.1)
Basophils Relative: 1 %
Eosinophils Absolute: 0.3 10*3/uL (ref 0.0–0.5)
Eosinophils Relative: 5 %
HCT: 43.7 % (ref 36.0–46.0)
Hemoglobin: 13.3 g/dL (ref 12.0–15.0)
Immature Granulocytes: 0 %
Lymphocytes Relative: 32 %
Lymphs Abs: 2.3 10*3/uL (ref 0.7–4.0)
MCH: 26.2 pg (ref 26.0–34.0)
MCHC: 30.4 g/dL (ref 30.0–36.0)
MCV: 86 fL (ref 80.0–100.0)
Monocytes Absolute: 0.4 10*3/uL (ref 0.1–1.0)
Monocytes Relative: 5 %
Neutro Abs: 4.1 10*3/uL (ref 1.7–7.7)
Neutrophils Relative %: 57 %
Platelets: 282 10*3/uL (ref 150–400)
RBC: 5.08 MIL/uL (ref 3.87–5.11)
RDW: 12.9 % (ref 11.5–15.5)
WBC: 7.1 10*3/uL (ref 4.0–10.5)
nRBC: 0 % (ref 0.0–0.2)

## 2023-02-03 LAB — COMPREHENSIVE METABOLIC PANEL
ALT: 19 U/L (ref 0–44)
AST: 16 U/L (ref 15–41)
Albumin: 4.1 g/dL (ref 3.5–5.0)
Alkaline Phosphatase: 72 U/L (ref 38–126)
Anion gap: 9 (ref 5–15)
BUN: 15 mg/dL (ref 6–20)
CO2: 27 mmol/L (ref 22–32)
Calcium: 9.2 mg/dL (ref 8.9–10.3)
Chloride: 100 mmol/L (ref 98–111)
Creatinine, Ser: 0.82 mg/dL (ref 0.44–1.00)
GFR, Estimated: >60 mL/min (ref >60)
Glucose, Bld: 95 mg/dL (ref 70–99)
Potassium: 3.3 mmol/L — ABNORMAL LOW (ref 3.5–5.1)
Sodium: 136 mmol/L (ref 135–145)
Total Bilirubin: 0.6 mg/dL (ref 0.3–1.2)
Total Protein: 8 g/dL (ref 6.5–8.1)

## 2023-02-03 LAB — URINALYSIS, ROUTINE W REFLEX MICROSCOPIC
Bacteria, UA: NONE SEEN
Bilirubin Urine: NEGATIVE
Glucose, UA: NEGATIVE mg/dL
Ketones, ur: NEGATIVE mg/dL
Leukocytes,Ua: NEGATIVE
Nitrite: NEGATIVE
Protein, ur: NEGATIVE mg/dL
Specific Gravity, Urine: 1.013 (ref 1.005–1.030)
pH: 6 (ref 5.0–8.0)

## 2023-02-03 LAB — PREGNANCY, URINE: Preg Test, Ur: NEGATIVE

## 2023-02-03 LAB — LIPASE, BLOOD: Lipase: 28 U/L (ref 11–51)

## 2023-02-03 NOTE — ED Triage Notes (Signed)
Patient has had generalized abdominal pain for 3 days. Has been vomiting. No diarrhea.

## 2023-02-03 NOTE — ED Provider Triage Note (Signed)
Emergency Medicine Provider Triage Evaluation Note  Bonnie Booth , a 22 y.o. female  was evaluated in triage.  Pt complains of left lower abdominal pain, ongoing for 2 days.  Patient reports she had cold and congestion symptoms for 3 days and her toddler who is present also had similar URI symptoms.  He says she is now having a sharp pain in her left lower side is waxing waning for 2 days.  Reports intermittent constipation issues, passing gas.  Reports mild nausea, no vomiting.  Reports history of appendectomy 1 year ago, no other abdominal surgery.  Denies vaginal discharge, reports she is on the end of her menstrual cycle  Review of Systems  Positive: Lower abdominal pain, congestion Negative: Vomiting, diarrhea  Physical Exam  BP 108/78 (BP Location: Left Arm)   Pulse 96   Temp 98.5 F (36.9 C) (Oral)   Resp 18   Ht 5\' 7"  (1.702 m)   Wt 90.7 kg   SpO2 99%   BMI 31.32 kg/m  Gen:   Awake, no distress   Resp:  Normal effort  MSK:   Moves extremities without difficulty  Other:  No reproducible abdominal tenderness on exam  Medical Decision Making  Medically screening exam initiated at 7:10 PM.  Appropriate orders placed.  Khylee ALLEAN MONTFORT was informed that the remainder of the evaluation will be completed by another provider, this initial triage assessment does not replace that evaluation, and the importance of remaining in the ED until their evaluation is complete.  Lower abdominal upper pelvic pain, intermittent Abdominal labs ordered, UA, U preg test   Terald Sleeper, MD 02/03/23 1911

## 2023-02-04 ENCOUNTER — Ambulatory Visit: Payer: Self-pay | Admitting: *Deleted

## 2023-02-04 NOTE — Telephone Encounter (Signed)
  Chief Complaint: Questions about her blood and urine tests that were done in the ED yesterday. Symptoms: Cloudy urine, frequency and abnormal results on urine test.  Also concerned about her low potassium level.    Frequency: Seen in ED yesterday. Pertinent Negatives: Patient denies N/A Disposition: [] ED /[] Urgent Care (no appt availability in office) / [] Appointment(In office/virtual)/ []  [place] [facility]/ [] Home Care/ [] Refused Recommended Disposition /[] [place] [facility]/ [x]  Follow-up with PCP Additional Notes: Advised her to contact her PCP regarding the urine and potassium results.   She was agreeable to doing this.   I let her know it was ok to eat bananas to get her potassium level up it was 3.3 so a banana every so often would help.   She thanked me for my help.

## 2023-02-04 NOTE — Telephone Encounter (Signed)
Reason for Disposition  Health Information question, no triage required and triager able to answer question  Answer Assessment - Initial Assessment Questions 1. REASON FOR CALL or QUESTION: "What is your reason for calling today?" or "How can I best help you?" or "What question do you have that I can help answer?"     I had questions about my lab results that I saw on MyChart.  I went to the ER yesterday.   I noticed my potassium is low.  Do I need to eat bananas to get my potassium back up?  I also had a question about my urine test.   Do I have an infection?  I advised her to call her PCP regarding the urine results since she has not gotten a call from the ED indicating whether she should be on antibiotics or not.  She is having urinary frequency and cloudy urine.    She was agreeable to calling her PCP and thanked me for my help.  Protocols used: Information Only Call - No Triage-A-AH

## 2023-04-06 ENCOUNTER — Ambulatory Visit
Admission: EM | Admit: 2023-04-06 | Discharge: 2023-04-06 | Disposition: A | Discharge status: Home / Self Care | Payer: Medicaid Other | Attending: Physician Assistant | Admitting: Physician Assistant

## 2023-04-06 DIAGNOSIS — H60391 Other infective otitis externa, right ear: Secondary | ICD-10-CM

## 2023-04-06 MED ORDER — CIPROFLOXACIN-DEXAMETHASONE 0.3-0.1 % OT SUSP: 4.0000 [drp] | Freq: Two times a day (BID) | OTIC | 0 refills | Status: DC

## 2023-04-06 NOTE — ED Provider Notes (Signed)
[facility]    CSN: [id number] Arrival date & time: 04/06/23  1747      History   Chief Complaint No chief complaint on file.   HPI Bonnie Booth is a 22 y.o. female.   Patient presents today with bilateral ear pain that is worse on the right.  This has been present for the past several weeks but has increased in the past several days.  She reports that pain is rated 8 on a 0-10 pain scale, localized to right ear, worse with manipulation of the ear, no alleviating factors identified.  She denies any significant congestion, cough, fever.  She did try numbing eardrops from her grandmother around Thanksgiving and this provided some relief of symptoms.  She is also taken Tylenol.  She denies any otorrhea, hearing change, fever, dizziness, nausea, vomiting.  Denies any recent swimming or airplane travel.  She does report that she takes a bath regularly and often will dunk her head and wonders if this could have triggered symptoms.  She denies history of diabetes or immunosuppression.  She has no concern for pregnancy.    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   Appendicitis 06/20/2020   Acute appendicitis 06/20/2020   IUD (intrauterine device) in place 05/26/2019   Perineal laceration, second degree 05/20/2019   Normal spontaneous vaginal delivery 05/20/2019   Labor and delivery, indication for care 05/19/2019   Preeclampsia 05/19/2019   Gestational diabetes 03/16/2019   Alpha thalassemia silent carrier 12/02/2018   Supervision of other normal pregnancy, antepartum 11/04/2018   Personal history of arthritis 01/17/2013   Panic disorder 01/17/2013    Adverse food reaction 01/17/2013    Past Surgical History:  Procedure Laterality Date   APPENDECTOMY  05/2020   LAPAROSCOPIC APPENDECTOMY N/A 06/21/2020   Procedure: APPENDECTOMY LAPAROSCOPIC;  Surgeon: Quentin Ore, MD;  Location: [facility];  Service: General;  Laterality: N/A;   NO PAST SURGERIES      OB History     Gravida  3   Para  1   Term  1   Preterm      AB  2   Living  1      SAB  2   IAB      Ectopic      Multiple  0   Live Births  1            Home Medications    Prior to Admission medications   Medication Sig Start Date End Date Taking? Authorizing Provider  ciprofloxacin-dexamethasone (CIPRODEX) OTIC suspension Place 4 drops into the right ear 2 (two) times daily. 04/06/23  Yes Tzivia Oneil K, PA-C  famotidine (PEPCID) 20 MG tablet Take 1 tablet (20 mg total) by mouth 2 (two) times daily. 01/09/22   Piontek, Denny Peon, MD  levonorgestrel (MIRENA) 20 MCG/24HR IUD 1 each by Intrauterine route once. Jan 2021 placed    [provider]  ondansetron (ZOFRAN-ODT) 8 MG disintegrating tablet 8mg  ODT q4 hours prn nausea 07/25/22   Geoffery Lyons, MD  phenol (CHLORASEPTIC) 1.4 % LIQD Use as directed 1 spray in the mouth or throat as needed for throat irritation /  pain. 10/06/22   Gustavus Bryant, FNP  triamcinolone (NASACORT) 55 MCG/ACT AERO nasal inhaler Place 2 sprays into the nose daily. 10/01/21   Rhys Martini, PA-C  enalapril (VASOTEC) 5 MG tablet Take 1 tablet (5 mg total) by mouth daily. Patient not taking: Reported on 03/05/2020 05/21/19 03/05/20  Shirlean Mylar, MD  ferrous sulfate 325 (65 FE) MG tablet Take 1 tablet (325 mg total) by mouth every other day. Patient not taking: Reported on 03/05/2020 05/22/19 03/05/20  Shirlean Mylar, MD    Family History Family History  Problem Relation Age of Onset   Healthy Son     Social History Social History   Tobacco Use   Smoking status: Never   Smokeless tobacco: Never  Vaping Use   Vaping  status: Never Used  Substance Use Topics   Alcohol use: No   Drug use: Not Currently     Allergies   Apple, Apple juice, Bee venom, Cinnamon, Mango flavor [flavoring agent], Pineapple, Prunus persica, Tomato, and Peach flavoring agent (non-screening)   Review of Systems Review of Systems  Constitutional:  Positive for activity change. Negative for appetite change, fatigue and fever.  HENT:  Positive for ear pain. Negative for congestion, ear discharge and sore throat.   Respiratory:  Negative for cough and shortness of breath.   Gastrointestinal:  Negative for abdominal pain, diarrhea, nausea and vomiting.  Neurological:  Negative for dizziness, light-headedness and headaches.     Physical Exam Triage Vital Signs ED Triage Vitals  Encounter Vitals Group     BP 04/06/23 1933 128/72     Systolic BP Percentile --      Diastolic BP Percentile --      Pulse Rate 04/06/23 1933 78     Resp 04/06/23 1933 18     Temp 04/06/23 1933 98.6 F (37 C)     Temp Source 04/06/23 1933 Oral     SpO2 04/06/23 1933 98 %     Weight 04/06/23 1936 200 lb (90.7 kg)     Height 04/06/23 1936 5\' 7"  (1.702 m)     Head Circumference --      Peak Flow --      Pain Score 04/06/23 1931 8     Pain Loc --      Pain Education --      Exclude from Growth Chart --    No data found.  Updated Vital Signs BP 128/72 (BP Location: Left Arm)   Pulse 78   Temp 98.6 F (37 C) (Oral)   Resp 18   Ht 5\' 7"  (1.702 m)   Wt 200 lb (90.7 kg)   LMP 03/26/2023 (Exact Date)   SpO2 98%   BMI 31.32 kg/m   Visual Acuity Right Eye Distance:   Left Eye Distance:   Bilateral Distance:    Right Eye Near:   Left Eye Near:    Bilateral Near:     Physical Exam Vitals reviewed.  Constitutional:      General: She is awake. She is not in acute distress.    Appearance: Normal appearance. She is well-developed. She is not ill-appearing.     Comments: Very pleasant female appears stated age in no acute distress  sitting comfortably in exam room  HENT:     Head: Normocephalic and atraumatic.     Right Ear: Tympanic membrane, ear canal and external ear normal. Swelling and tenderness present. Tympanic membrane is not erythematous or bulging.     Left Ear:  Tympanic membrane, ear canal and external ear normal. Tympanic membrane is not erythematous or bulging.     Ears:     Comments: Right ear: Tenderness to palpation of tragus and pain with manipulation of external ear.  Erythema with some maceration noted of external auditory canal.  Normal-appearing TM.    Nose:     Right Sinus: No maxillary sinus tenderness or frontal sinus tenderness.     Left Sinus: No maxillary sinus tenderness or frontal sinus tenderness.     Mouth/Throat:     Pharynx: Uvula midline. No oropharyngeal exudate or posterior oropharyngeal erythema.  Cardiovascular:     Rate and Rhythm: Normal rate and regular rhythm.     Heart sounds: Normal heart sounds, S1 normal and S2 normal. No murmur heard. Pulmonary:     Effort: Pulmonary effort is normal.     Breath sounds: Normal breath sounds. No wheezing, rhonchi or rales.     Comments: Clear to auscultation bilaterally thus Psychiatric:        Behavior: Behavior is cooperative.      UC Treatments / Results  Labs (all labs ordered are listed, but only abnormal results are displayed) Labs Reviewed - No data to display  EKG   Radiology No results found.  Procedures Procedures (including critical care time)  Medications Ordered in UC Medications - No data to display  Initial Impression / Assessment and Plan / UC Course  I have reviewed the triage vital signs and the nursing notes.  Pertinent labs & imaging results that were available during my care of the patient were reviewed by me and considered in my medical decision making (see chart for details).     Patient is well-appearing, afebrile, nontoxic, nontachycardic.  Concern for otitis externa given clinical  presentation.  This was likely triggered by repeated water exposure in her back.  Will start Ciprodex to manage symptoms.  Discussed that she should keep her ear facing upward for a few minutes after starting medication to allow complete penetration throughout the ear canal.  She is to avoid putting anything in the ear including earbuds, Q-tips, earplugs and avoid submerging her head in water until symptoms resolve.  If anything worsens or changes she is to return for reevaluation.  Strict return precautions given.  Final Clinical Impressions(s) / UC Diagnoses   Final diagnoses:  Infective otitis externa, right     Discharge Instructions      Use Ciprodex twice daily in the right ear.  Keep your ear facing upward for a few minutes after applying the drops to allow them to go throughout the ear canal.  Do not put anything in your ear including earbuds, Q-tips, earplugs.  Do not submerge your head in water.  Use Tylenol and ibuprofen for pain.  If anything worsens or changes please return for reevaluation.     ED Prescriptions     Medication Sig Dispense Auth. Provider   ciprofloxacin-dexamethasone (CIPRODEX) OTIC suspension Place 4 drops into the right ear 2 (two) times daily. 7.5 mL Jt Brabec K, PA-C      PDMP not reviewed this encounter.   Bonnie Hawking, PA-C 04/06/23 1955

## 2023-04-06 NOTE — ED Notes (Signed)
Patient presents with bilateral ear pain x 3/4 weeks. Treated with numbing ear drops and Tylenol.

## 2023-04-06 NOTE — Discharge Instructions (Signed)
Use Ciprodex twice daily in the right ear.  Keep your ear facing upward for a few minutes after applying the drops to allow them to go throughout the ear canal.  Do not put anything in your ear including earbuds, Q-tips, earplugs.  Do not submerge your head in water.  Use Tylenol and ibuprofen for pain.  If anything worsens or changes please return for reevaluation.

## 2023-04-06 NOTE — ED Triage Notes (Signed)
  Patient presents with bilateral ear pain x 3/4 weeks. Treated with numbing ear drops and Tylenol with minimal relief.

## 2023-04-29 NOTE — L&D Delivery Note (Cosign Needed Addendum)
 OB/GYN Faculty Practice Delivery Note  Bonnie Booth is a 23 y.o. [id number] s/p SVD at [redacted]w[redacted]d. She was admitted for IOL for LGA.   ROM: 2h 73m with clear fluid GBS Status: positive Maximum Maternal Temperature: 98.25F  Labor Progress: Patient dilated to 2cm upon presentation. She was given dual oral/vaginal cytotec  and PCN for GBS ppx. She was augmented with AROM and progressed to 10cm. Shortly before delivery she got an epidural  Delivery Date/Time: 01/17/24 10:32pm Delivery: Called to room and patient was complete and pushing. Head delivered left occiput anterior. nuchal cord present loose, delivered through. Shoulder and body delivered in usual fashion. Infant with spontaneous cry, placed on mother's abdomen, dried and stimulated. Cord clamped x 2 after 1-minute delay, and cut by father. Cord blood drawn. Placenta delivered spontaneously with gentle cord traction. Fundus firm with massage and Pitocin . Labia, perineum, vagina, and cervix inspected inspected with a first degree perineal tear which was bleeding and repaired with 3-0 Monocryl and a left labial tear which was hemostatic and not repaired. After delivery of placenta Mirena  IUD was inserted at the fundus of the uterus with the applicator. Strings cut to the level of the vaginal introitus  Placenta:  spontaneous Intact Complications:none Lacerations: 1st degree and left labial EBL: Analgesia: Epidural  Newborn Data: Birth date:01/17/2026 Birth time:10:32 PM Gender:Female Living status:living Apgars:9,9 Weight: pending       Leeroy Pouch, MD [facility] Fellow, [facility] for [facility], [facility] 01/17/2024, 11:08 PM

## 2023-05-09 IMAGING — CT CT ABD-PELV W/ CM
2 of 4 series · 16 of 46 positions shown, 18 images · IV contrast (APPLIED)
Comparison: 07/11/2020

CLINICAL DATA: Nausea and vomiting.  Abdominal pain.

EXAM:
CT ABDOMEN AND PELVIS WITH CONTRAST
TECHNIQUE: Multidetector CT imaging of the abdomen and pelvis was performed
using the standard protocol following bolus administration of
intravenous contrast.

[Series 2: abd pel w · axial · 0.91mm/px · z∈[-510,-50]mm · 13 of 100 slices shown, 15 images]
[im 4/100  soft-tissue]
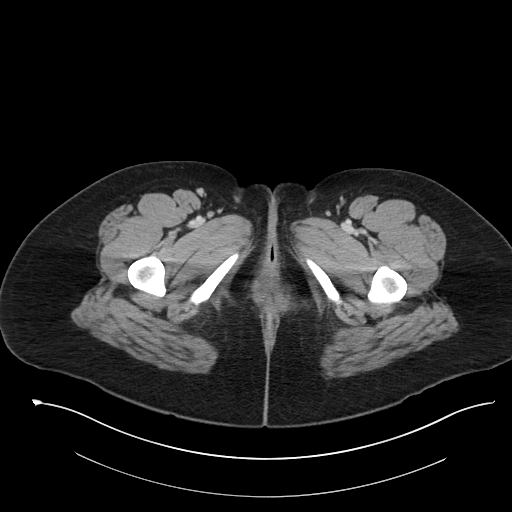
[im 4/100  bone]
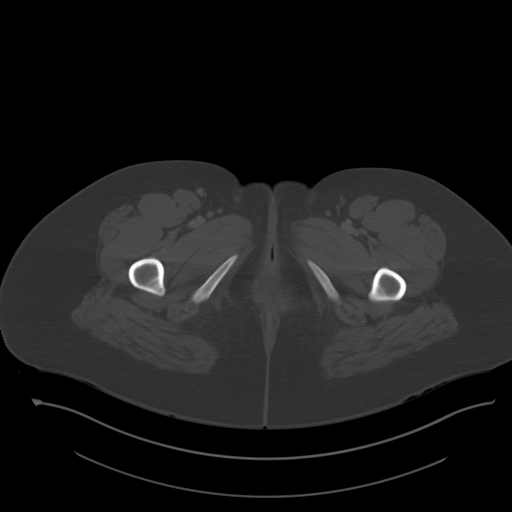
[im 12/100  soft-tissue]
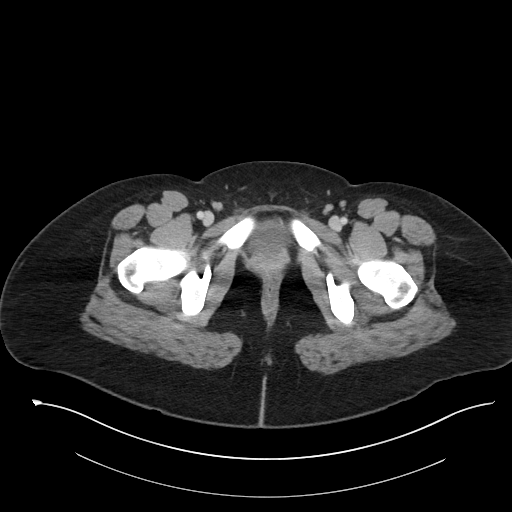
[im 20/100  soft-tissue]
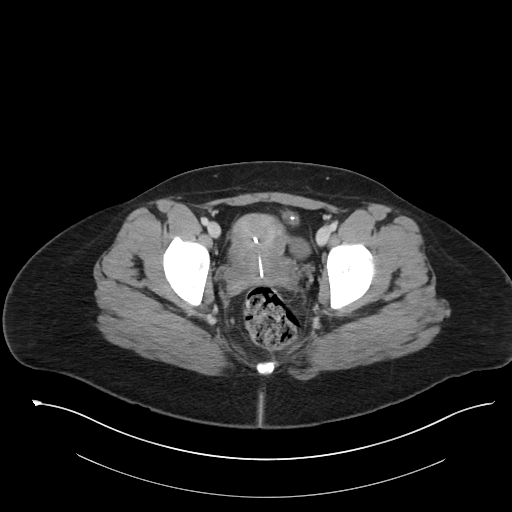
[im 27/100  soft-tissue]
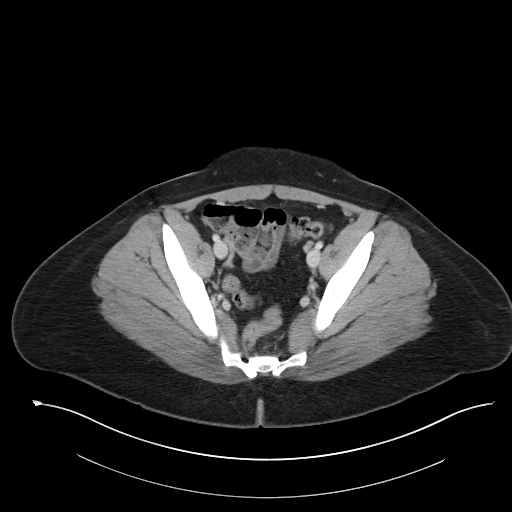
[im 35/100  soft-tissue]
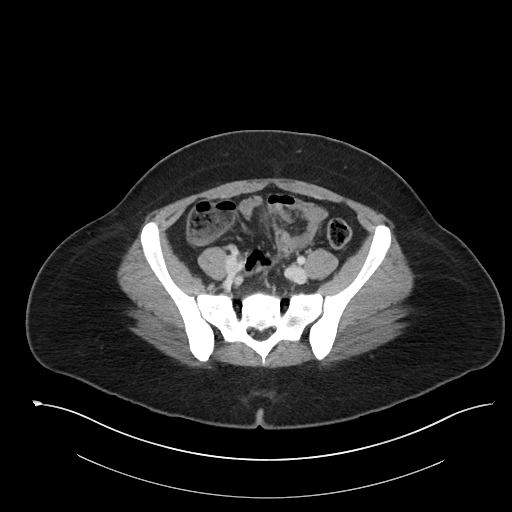
[im 42/100  soft-tissue]
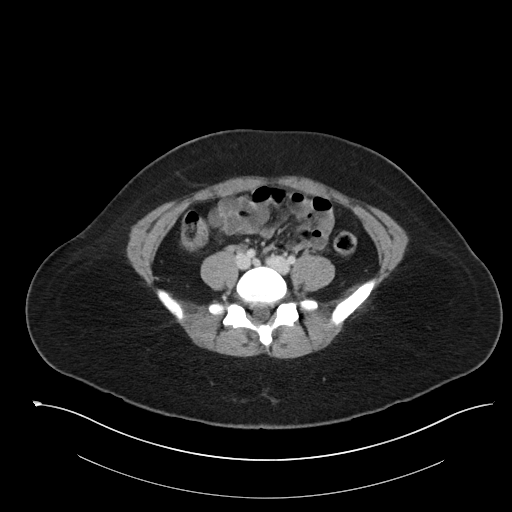
[im 50/100  soft-tissue]
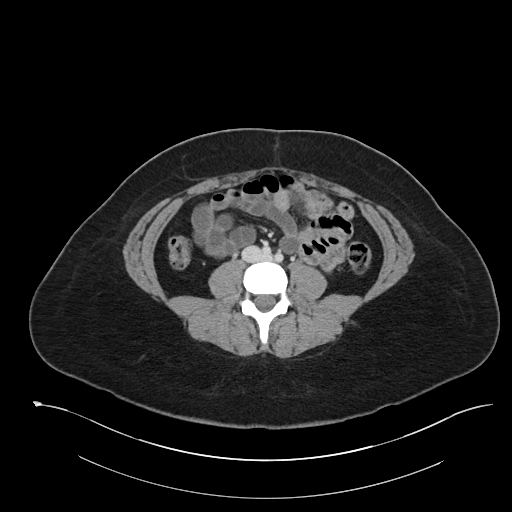
[im 58/100  soft-tissue]
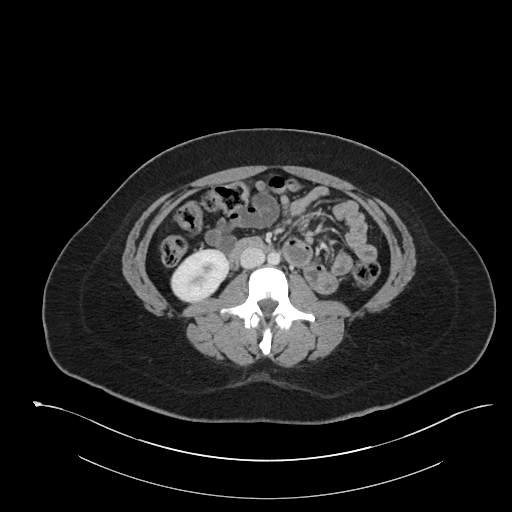
[im 65/100  soft-tissue]
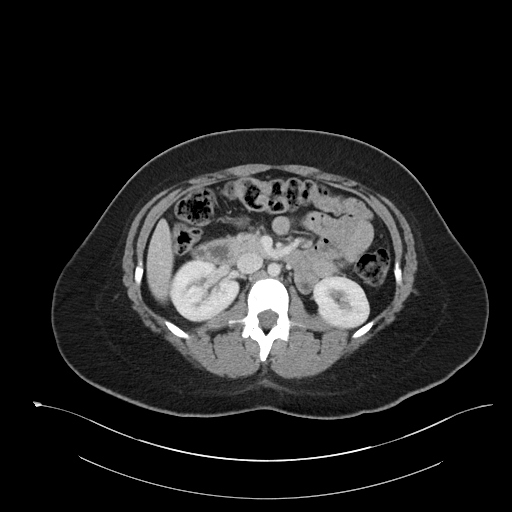
[im 65/100  bone]
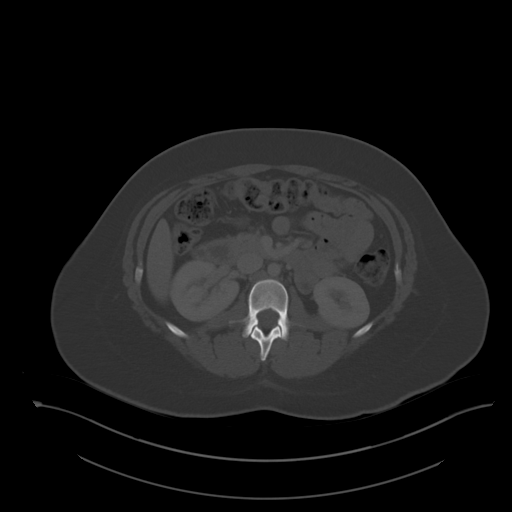
[im 73/100  soft-tissue]
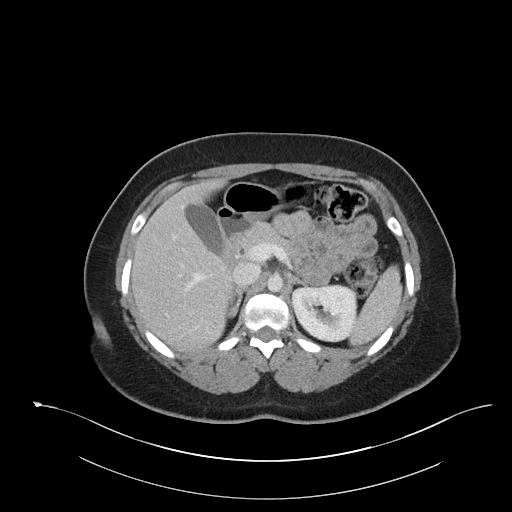
[im 80/100  soft-tissue]
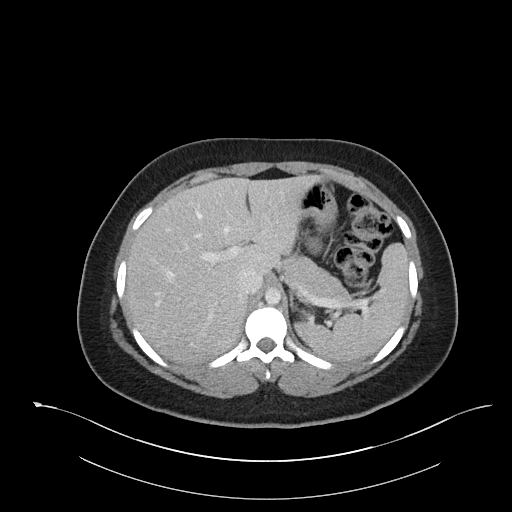
[im 88/100  soft-tissue]
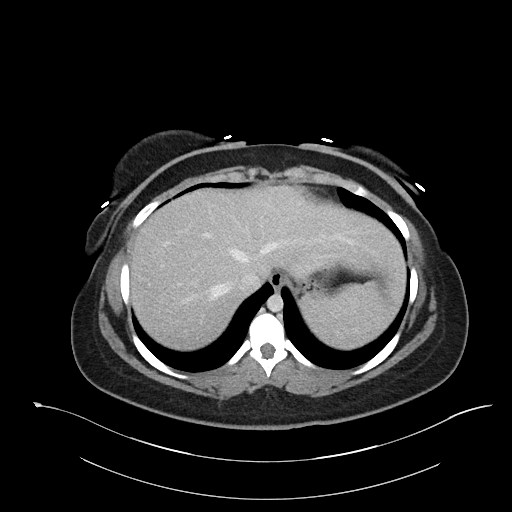
[im 96/100  soft-tissue]
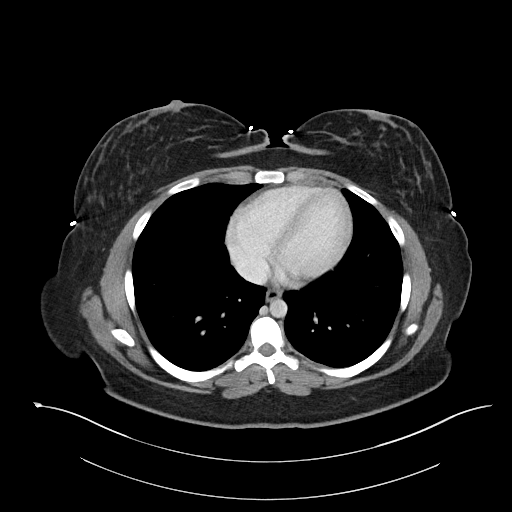

[Series 5: coronal · coronal · 0.88mm/px · 3 of 92 slices shown]
[im 31/92  soft-tissue]
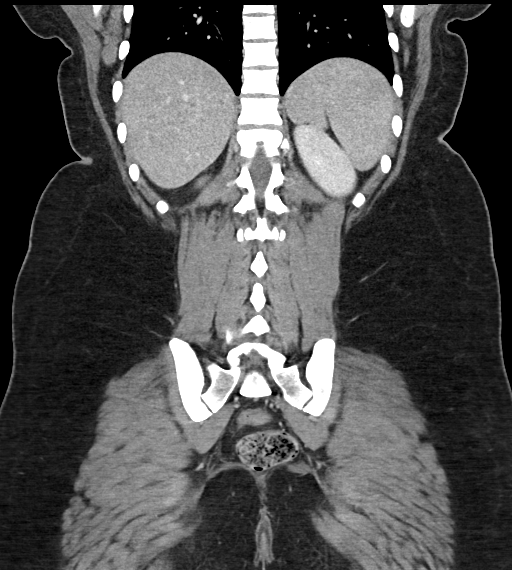
[im 41/92  soft-tissue]
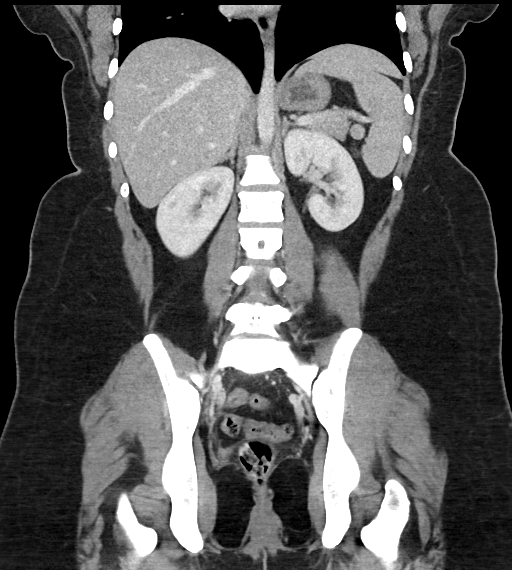
[im 51/92  soft-tissue]
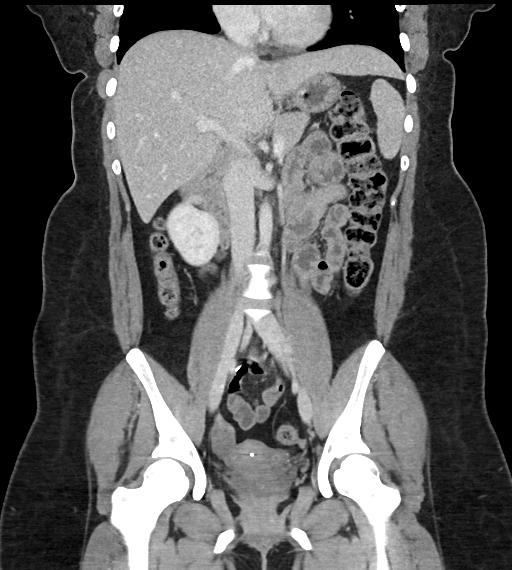

[16 of 46 positions shown; findings below may reference images not displayed]

RADIATION DOSE REDUCTION: This exam was performed according to the
departmental dose-optimization program which includes automated
exposure control, adjustment of the mA and/or kV according to
patient size and/or use of iterative reconstruction technique.

CONTRAST:  100mL OMNIPAQUE IOHEXOL 300 MG/ML  SOLN
FINDINGS: Lower chest: Unremarkable.

Hepatobiliary: No suspicious focal abnormality within the liver
parenchyma. There is no evidence for gallstones, gallbladder wall
thickening, or pericholecystic fluid. No intrahepatic or
extrahepatic biliary dilation.

Pancreas: No focal mass lesion. No dilatation of the main duct. No
intraparenchymal cyst. No peripancreatic edema.

Spleen: No splenomegaly. No focal mass lesion.

Adrenals/Urinary Tract: No adrenal nodule or mass. Right kidney
unremarkable. 1-2 mm nonobstructing stone noted upper pole left
kidney. No evidence for hydroureter. The urinary bladder appears
normal for the degree of distention.

Stomach/Bowel: Stomach is unremarkable. No gastric wall thickening.
No evidence of outlet obstruction. Duodenum is normally positioned
as is the ligament of Treitz. No small bowel wall thickening. No
small bowel dilatation. The terminal ileum is normal.
Nonvisualization of the appendix is consistent with the reported
history of appendectomy. No gross colonic mass. No colonic wall
thickening.

Vascular/Lymphatic: Normal abdominal aorta. There is no
gastrohepatic or hepatoduodenal ligament lymphadenopathy. No
retroperitoneal or mesenteric lymphadenopathy. No pelvic sidewall
lymphadenopathy.

Reproductive: IUD is visualized in the uterus. IUD is rotated 90
degrees along the long axis with the arms directed anteriorly and
posteriorly into the myometrium rather than in plane with the
endometrial canal (see sagittal image 67/series 6). This position is
stable since prior study from almost 1 year ago. There is no adnexal
mass.

Other: No intraperitoneal free fluid.

Musculoskeletal: No worrisome lytic or sclerotic osseous
abnormality.
IMPRESSION: 1. No acute findings in the abdomen or pelvis. Specifically, no
findings to explain the patient's history of nausea and vomiting.
2. 1-2 mm nonobstructing left renal stone.
3. Stable IUD position with arms extending anteriorly and
posteriorly into the myometrium.

## 2023-05-20 ENCOUNTER — Ambulatory Visit: Payer: Medicaid Other

## 2023-05-20 VITALS — BP 127/82 | HR 105 | Ht 67.0 in | Wt 209.0 lb

## 2023-05-20 DIAGNOSIS — Z3201 Encounter for pregnancy test, result positive: Secondary | ICD-10-CM

## 2023-05-20 LAB — POCT URINE PREGNANCY: Preg Test, Ur: POSITIVE — AB

## 2023-05-20 NOTE — Progress Notes (Addendum)
[name] presents today for UPT. She has no unusual complaints.  LMP: 04/19/2023 approx.    OBJECTIVE: Appears well, in no apparent distress.  OB History     Gravida  4   Para  1   Term  1   Preterm      AB  2   Living  1      SAB  2   IAB      Ectopic      Multiple  0   Live Births  1          Home UPT Result: POSITIVE X2 In-Office UPT result: POSITIVE  I have reviewed the patient's medical, obstetrical, social, and family histories, and medications.   ASSESSMENT: Positive pregnancy test  PLAN Prenatal care to be completed at: [facility] Make appointments for Nurse Intake and NOB visit.

## 2023-06-10 ENCOUNTER — Ambulatory Visit: Payer: Medicaid Other | Admitting: *Deleted

## 2023-06-10 ENCOUNTER — Other Ambulatory Visit (INDEPENDENT_AMBULATORY_CARE_PROVIDER_SITE_OTHER): Payer: Medicaid Other

## 2023-06-10 ENCOUNTER — Other Ambulatory Visit (HOSPITAL_COMMUNITY)
Admission: RE | Admit: 2023-06-10 | Discharge: 2023-06-10 | Disposition: A | Discharge status: Home / Self Care | Payer: Medicaid Other | Source: Ambulatory Visit | Attending: Family Medicine | Admitting: Family Medicine

## 2023-06-10 VITALS — BP 118/74 | HR 78 | Wt 213.7 lb

## 2023-06-10 DIAGNOSIS — Z1339 Encounter for screening examination for other mental health and behavioral disorders: Secondary | ICD-10-CM

## 2023-06-10 DIAGNOSIS — O0991 Supervision of high risk pregnancy, unspecified, first trimester: Secondary | ICD-10-CM | POA: Diagnosis not present

## 2023-06-10 DIAGNOSIS — O0990 Supervision of high risk pregnancy, unspecified, unspecified trimester: Secondary | ICD-10-CM | POA: Insufficient documentation

## 2023-06-10 DIAGNOSIS — O3680X0 Pregnancy with inconclusive fetal viability, not applicable or unspecified: Secondary | ICD-10-CM

## 2023-06-10 DIAGNOSIS — Z3A01 Less than 8 weeks gestation of pregnancy: Secondary | ICD-10-CM

## 2023-06-10 DIAGNOSIS — Z3481 Encounter for supervision of other normal pregnancy, first trimester: Secondary | ICD-10-CM

## 2023-06-10 MED ORDER — DOCUSATE SODIUM 100 MG PO CAPS: 100.0000 mg | ORAL_CAPSULE | Freq: Two times a day (BID) | ORAL | 0 refills | Status: DC

## 2023-06-10 MED ORDER — PRENATE PIXIE 10-0.6-0.4-200 MG PO CAPS
1.0000 | ORAL_CAPSULE | Freq: Every day | ORAL | 11 refills | Status: DC
Start: 2023-06-10 — End: 2023-12-21

## 2023-06-10 MED ORDER — PROMETHAZINE HCL 25 MG PO TABS: 25.0000 mg | ORAL_TABLET | Freq: Four times a day (QID) | ORAL | 1 refills | Status: DC | PRN

## 2023-06-10 NOTE — Patient Instructions (Signed)

## 2023-06-10 NOTE — Progress Notes (Cosign Needed Addendum)
New OB Intake  I connected with Bonnie Booth  on 06/10/23 at  1:10 PM EST by In Person Visit and verified that I am speaking with the correct person using two identifiers. Nurse is located at [facility] and pt is located at [facility].  I discussed the limitations, risks, security and privacy concerns of performing an evaluation and management service by telephone and the availability of in person appointments. I also discussed with the patient that there may be a patient responsible charge related to this service. The patient expressed understanding and agreed to proceed.  I explained I am completing New OB Intake today. We discussed EDD of 01/24/2024, by Last Menstrual Period. Pt is [id number]. I reviewed her allergies, medications and Medical/Surgical/OB history.    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   Appendicitis 06/20/2020   Acute appendicitis 06/20/2020   IUD (intrauterine device) in place 05/26/2019   Perineal laceration, second degree 05/20/2019   Normal spontaneous vaginal delivery 05/20/2019   Labor and delivery, indication for care 05/19/2019   Preeclampsia 05/19/2019   Gestational diabetes 03/16/2019   Alpha thalassemia silent carrier 12/02/2018   Supervision of other normal pregnancy, antepartum 11/04/2018   Personal history of arthritis 01/17/2013   Panic disorder 01/17/2013   Adverse food reaction 01/17/2013    Concerns addressed today  Delivery Plans Plans to deliver at [facility]. Discussed the nature of our practice with multiple providers including residents and students. Due to the size of the practice, the delivering provider may not be the same as those providing prenatal care.   Patient is not interested in water birth. Offered upcoming OB visit with CNM to discuss  further.  MyChart/Babyscripts MyChart access verified. I explained pt will have some visits in office and some virtually. Babyscripts instructions given and order placed. Patient verifies receipt of registration text/e-mail. Account successfully created and app downloaded. If patient is a candidate for Optimized scheduling, add to sticky note.   Blood Pressure Cuff/Weight Scale Blood pressure cuff ordered for patient to pick-up from [employer]. Explained after first prenatal appt pt will check weekly and document in Babyscripts. Patient does not have weight scale; patient may purchase if they desire to track weight weekly in Babyscripts.  Anatomy US Explained first scheduled [place] will be around 19 weeks. Anatomy US scheduled for T[facility] at [facility].  Interested in [place]? If yes, send referral and doula dot phrase.   Is patient a candidate for Babyscripts Optimization? No, due to Risk Factors   First visit review I reviewed new OB appt with patient. Explained pt will be seen by [facility] at first visit. Discussed Avelina Laine genetic screening with patient. Requsts Panorama. Routine prenatal labs  OB Urine and GC/CC only collected at today's visit. Initial OB labs deferred to New OB appt.    Last Pap No results found for: "DIAGPAP"  Harrel Lemon, RN 06/10/2023  1:17 PM

## 2023-06-11 LAB — CERVICOVAGINAL ANCILLARY ONLY
Bacterial Vaginitis (gardnerella): NEGATIVE
Candida Glabrata: NEGATIVE
Candida Vaginitis: NEGATIVE
Chlamydia: NEGATIVE
Comment: NEGATIVE
Comment: NEGATIVE
Comment: NEGATIVE
Comment: NEGATIVE
Comment: NEGATIVE
Comment: NORMAL
Neisseria Gonorrhea: NEGATIVE
Trichomonas: NEGATIVE

## 2023-06-12 LAB — CULTURE, OB URINE

## 2023-06-12 LAB — URINE CULTURE, OB REFLEX: Organism ID, Bacteria: NO GROWTH

## 2023-07-12 ENCOUNTER — Encounter (HOSPITAL_COMMUNITY): Payer: Self-pay | Admitting: Obstetrics and Gynecology

## 2023-07-12 ENCOUNTER — Inpatient Hospital Stay (HOSPITAL_COMMUNITY)
Admission: AD | Admit: 2023-07-12 | Discharge: 2023-07-12 | Disposition: A | Discharge status: Home / Self Care | Attending: Obstetrics and Gynecology | Admitting: Obstetrics and Gynecology

## 2023-07-12 DIAGNOSIS — R102 Pelvic and perineal pain: Secondary | ICD-10-CM | POA: Diagnosis not present

## 2023-07-12 DIAGNOSIS — O26891 Other specified pregnancy related conditions, first trimester: Secondary | ICD-10-CM | POA: Insufficient documentation

## 2023-07-12 DIAGNOSIS — Z3A12 12 weeks gestation of pregnancy: Secondary | ICD-10-CM | POA: Insufficient documentation

## 2023-07-12 DIAGNOSIS — R519 Headache, unspecified: Secondary | ICD-10-CM | POA: Diagnosis not present

## 2023-07-12 DIAGNOSIS — K5900 Constipation, unspecified: Secondary | ICD-10-CM | POA: Insufficient documentation

## 2023-07-12 DIAGNOSIS — O99611 Diseases of the digestive system complicating pregnancy, first trimester: Secondary | ICD-10-CM | POA: Insufficient documentation

## 2023-07-12 LAB — URINALYSIS, ROUTINE W REFLEX MICROSCOPIC
Bilirubin Urine: NEGATIVE
Glucose, UA: NEGATIVE mg/dL
Hgb urine dipstick: NEGATIVE
Ketones, ur: NEGATIVE mg/dL
Leukocytes,Ua: NEGATIVE
Nitrite: NEGATIVE
Protein, ur: NEGATIVE mg/dL
Specific Gravity, Urine: 1.017 (ref 1.005–1.030)
pH: 6 (ref 5.0–8.0)

## 2023-07-12 LAB — WET PREP, GENITAL
Clue Cells Wet Prep HPF POC: NONE SEEN
Sperm: NONE SEEN
Trich, Wet Prep: NONE SEEN
WBC, Wet Prep HPF POC: >=10 — AB (ref <10)
Yeast Wet Prep HPF POC: NONE SEEN

## 2023-07-12 MED ORDER — SENNA 8.6 MG PO TABS: 2.0000 | ORAL_TABLET | Freq: Every day | ORAL | 0 refills | Status: DC | PRN

## 2023-07-12 MED ORDER — ACETAMINOPHEN-CAFFEINE 500-65 MG PO TABS
2.0000 | ORAL_TABLET | Freq: Once | ORAL | Status: AC
  Administered 2023-07-12: 2 via ORAL
  Filled 2023-07-12: qty 2

## 2023-07-12 MED ORDER — ACETAMINOPHEN 500 MG PO TABS: 1000.0000 mg | ORAL_TABLET | Freq: Three times a day (TID) | ORAL | 0 refills | Status: DC | PRN

## 2023-07-12 MED ORDER — POLYETHYLENE GLYCOL 3350 17 GM/SCOOP PO POWD
17.0000 g | Freq: Every day | ORAL | 0 refills | Status: DC | PRN
Start: 2023-07-12 — End: 2024-01-19

## 2023-07-12 NOTE — MAU Provider Note (Addendum)
 [facility] Attending MAU Note  Chief Complaint: Headache and Hip Pain    Event Date/Time   First Provider Initiated Contact with Patient 07/12/23 2108    SUBJECTIVE Bonnie Booth is a 23 y.o. [id number] at [redacted]w[redacted]d by LMP who presents with multiple concerns of headache, dizziness, and right abdominal & pelvic pain.  Patient endorses constipation and bloating. She denies visual changes, fever, chills, weakness, dysuria, and vaginal bleeding.   She reports that she began feeling abdominal cramping in her right lower abdominal and pelvic area around 5 AM this morning, but then she went back to sleep. When she woke up again this morning at 11 AM, she began feeling headaches that she described as throbbing and pulsating. The headache was associated with dizziness. She reported that the dizziness felt like motion sickness. The dizziness was triggered by getting up too quickly from sitting or laying down. Long history of constipation.  Has had ultrasound that confirmed IUP.  Past Medical History:  Diagnosis Date   Anemia    Hypertension    Kidney stones    Liver mass    Pre-diabetes    Rheumatoid arthritis (HCC)    OB History  Gravida Para Term Preterm AB Living  4 1 1  2 1   SAB IAB Ectopic Multiple Live Births  2 0 0 0 1    # Outcome Date GA Lbr Len/2nd Weight Sex Type Anes PTL Lv  4 Current           3 Term 05/19/19 [redacted]w[redacted]d 02:33 / 02:22 4099 g M Vag-Spont EPI  LIV     Birth Comments: WDL  2 SAB           1 SAB            Past Surgical History:  Procedure Laterality Date   APPENDECTOMY  05/2020   LAPAROSCOPIC APPENDECTOMY N/A 06/21/2020   Procedure: APPENDECTOMY LAPAROSCOPIC;  Surgeon: Stechschulte, Hyman Hopes, MD;  Location: [facility];  Service: General;  Laterality: N/A;   Social History   Socioeconomic History   Marital status: Single    Spouse name: Not on file   Number of children: Not on file   Years of education: Not on file   Highest education level: Not on file   Occupational History   Not on file  Tobacco Use   Smoking status: Never   Smokeless tobacco: Never  Vaping Use   Vaping status: Never Used  Substance and Sexual Activity   Alcohol use: No   Drug use: Not Currently   Sexual activity: Yes    Birth control/protection: None  Other Topics Concern   Not on file  Social History Narrative   Not on file   Social Drivers of Health   Financial Resource Strain: Not on file  Food Insecurity: Not on file  Transportation Needs: Not on file  Physical Activity: Not on file  Stress: Not on file  Social Connections: Not on file  Intimate Partner Violence: Not on file   No current facility-administered medications on file prior to encounter.   Current Outpatient Medications on File Prior to Encounter  Medication Sig Dispense Refill   Prenat-FeAsp-Meth-FA-DHA w/o A (PRENATE PIXIE) 10-0.6-0.4-200 MG CAPS Take 1 tablet by mouth daily. 30 capsule 11   promethazine (PHENERGAN) 25 MG tablet Take 1 tablet (25 mg total) by mouth every 6 (six) hours as needed for nausea or vomiting. 30 tablet 1   [DISCONTINUED] enalapril (VASOTEC) 5 MG tablet Take 1  tablet (5 mg total) by mouth daily. (Patient not taking: Reported on 03/05/2020) 30 tablet 0   [DISCONTINUED] ferrous sulfate 325 (65 FE) MG tablet Take 1 tablet (325 mg total) by mouth every other day. (Patient not taking: Reported on 03/05/2020) 30 tablet 0   Allergies  Allergen Reactions   Apple Anaphylaxis and Swelling    Patient's throat swells   Apple Juice Anaphylaxis and Swelling    Patient's throat swells   Bee Venom Anaphylaxis and Swelling    Patient's throat swells   Cinnamon Anaphylaxis and Swelling    Patient's throat swells   Mango Flavor [Flavoring Agent (Non-Screening)] Anaphylaxis and Swelling    Patient's throat swells   Pineapple Anaphylaxis and Swelling    Patient's throat swells   Prunus Persica Anaphylaxis, Rash and Swelling   Tomato Anaphylaxis and Nausea And Vomiting    Peach Flavoring Agent (Non-Screening)     ROS: Pertinent items in HPI  OBJECTIVE BP 132/67   Pulse 67   Temp 97.7 F (36.5 C) (Oral)   Resp 17   Ht 5\' 7"  (1.702 m)   Wt 100.1 kg   LMP 04/19/2023 (Approximate)   SpO2 99%   BMI 34.57 kg/m  CONSTITUTIONAL: Well-developed, well-nourished female in no acute distress.  SKIN: Skin is warm and dry. No rash noted. Not diaphoretic. No erythema. No pallor. NEUROLGIC: Alert and oriented to person, place, and time. Normal muscle tone. No gross abnormalities PSYCHIATRIC: Normal mood and affect. Normal behavior. Normal judgment and thought content. CARDIOVASCULAR: Normal heart rate noted RESPIRATORY: Effort and breath sounds normal, no problems with respiration noted. ABDOMEN: Soft, normal bowel sounds, no distention noted.  No tenderness, rebound or guarding.  MUSCULOSKELETAL: Normal range of motion. No tenderness. No cyanosis, clubbing, or edema. 2+ distal pulses.  LAB RESULTS Results for orders placed or performed during the hospital encounter of 07/12/23 (from the past 48 hours)  Urinalysis, Routine w reflex microscopic -Urine, Clean Catch     Status: None   Collection Time: 07/12/23  6:59 PM  Result Value Ref Range   Color, Urine YELLOW YELLOW   APPearance CLEAR CLEAR   Specific Gravity, Urine 1.017 1.005 - 1.030   pH 6.0 5.0 - 8.0   Glucose, UA NEGATIVE NEGATIVE mg/dL   Hgb urine dipstick NEGATIVE NEGATIVE   Bilirubin Urine NEGATIVE NEGATIVE   Ketones, ur NEGATIVE NEGATIVE mg/dL   Protein, ur NEGATIVE NEGATIVE mg/dL   Nitrite NEGATIVE NEGATIVE   Leukocytes,Ua NEGATIVE NEGATIVE    Comment: Performed at [facility], [address], [place], [place] [address]  Wet prep, genital     Status: Abnormal   Collection Time: 07/12/23  9:09 PM  Result Value Ref Range   Yeast Wet Prep HPF POC NONE SEEN NONE SEEN   Trich, Wet Prep NONE SEEN NONE SEEN   Clue Cells Wet Prep HPF POC NONE SEEN NONE SEEN   WBC, Wet Prep HPF POC >=10 (A)  <10   Sperm NONE SEEN     Comment: Performed at [facility], [address] [place], [place] [address]    IMAGING No results found.  MAU COURSE  The patient presented with complaints of  headache, dizziness, and right abdominal & pelvic pain, prompting a urinalysis, a vaginal swab for further evaluation. Urinalysis was and vaginal swab was performed to assess for any infections or other abnormalities. Does not have vaginal bleeding to suggest miscarriage at this time. The patient also reported headache, for which Excedrin  Tension was given, resulting in Sx relief.   ASSESSMENT 1. Pregnancy headache in first trimester   2. Constipation during pregnancy in first trimester   3. [redacted] weeks gestation of pregnancy    PLAN  #Headache: No red flag s/sx. Resolved with excedrin tension. Suspect tension/migrainous in nature. Provided with tylenol for home.  #Pelvic pain: suspect constipation as cause. Benign abdominal exam. Swabs and urine were negative. Provided with senna and miralax at home. No s/sx of miscarriage at this time.  Evaluation does not show pathology that would require ongoing emergent intervention or inpatient treatment. Patient is hemodynamically stable and mentating appropriately. Discussed findings and plan with patient, who agrees with care plan. All questions answered. Return precautions discussed and outpatient follow up recommendations given.  Allergies as of 07/12/2023       Reactions   Apple Anaphylaxis, Swelling   Patient's throat swells   Apple Juice Anaphylaxis, Swelling   Patient's throat swells   Bee Venom Anaphylaxis, Swelling   Patient's throat swells   Cinnamon Anaphylaxis, Swelling   Patient's throat swells   Mango Flavor [flavoring Agent (non-screening)] Anaphylaxis, Swelling   Patient's throat swells   Pineapple Anaphylaxis, Swelling   Patient's throat swells   Prunus Persica Anaphylaxis, Rash, Swelling   Tomato Anaphylaxis, Nausea And Vomiting    Peach Flavoring Agent (non-screening)         Medication List     STOP taking these medications    Chloraseptic 1.4 % Liqd Generic drug: phenol   ciprofloxacin-dexamethasone OTIC suspension Commonly known as: Ciprodex   docusate sodium 100 MG capsule Commonly known as: Colace   famotidine 20 MG tablet Commonly known as: PEPCID   levonorgestrel 20 MCG/24HR IUD Commonly known as: MIRENA   ondansetron 8 MG disintegrating tablet Commonly known as: ZOFRAN-ODT   triamcinolone 55 MCG/ACT Aero nasal inhaler Commonly known as: NASACORT       TAKE these medications    acetaminophen 500 MG tablet Commonly known as: TYLENOL Take 2 tablets (1,000 mg total) by mouth every 8 (eight) hours as needed for headache.   polyethylene glycol powder 17 GM/SCOOP powder Commonly known as: GLYCOLAX/MIRALAX Take 17 g by mouth daily as needed for moderate constipation.   Prenate Pixie 10-0.6-0.4-200 MG Caps Take 1 tablet by mouth daily.   promethazine 25 MG tablet Commonly known as: PHENERGAN Take 1 tablet (25 mg total) by mouth every 6 (six) hours as needed for nausea or vomiting.   senna 8.6 MG Tabs tablet Commonly known as: SENOKOT Take 2 tablets (17.2 mg total) by mouth daily as needed for mild constipation.        Kerrin Mo, Medical Student 07/12/2023 10:13 PM   Attestation of Supervision of Student:  I confirm that I have verified the information documented in the medical student's note and that I have also personally reperformed the history, physical exam and all medical decision making activities.  I have verified that all services and findings are accurately documented in this student's note; and I agree with management and plan as outlined in the documentation. I have also made any necessary editorial changes.  Joanne Gavel, MD OB Fellow 07/12/2023 10:21 PM

## 2023-07-12 NOTE — Discharge Instructions (Addendum)
 Safe Medications in Pregnancy   Acne: Benzoyl Peroxide Salicylic Acid  Backache/Headache: Tylenol: 2 regular strength every 4 hours OR              2 Extra strength every 6 hours  Colds/Coughs/Allergies: Benadryl (alcohol free) 25 mg every 6 hours as needed Breath right strips Claritin Cepacol throat lozenges Chloraseptic throat spray Cold-Eeze- up to three times per day Cough drops, alcohol free Flonase (by prescription only) Guaifenesin Mucinex Robitussin DM (plain only, alcohol free) Saline nasal spray/drops Sudafed (pseudoephedrine) & Actifed ** use only after [redacted] weeks gestation and if you do not have high blood pressure Tylenol Vicks Vaporub Zinc lozenges Zyrtec   Constipation: Ducolax suppositories Fleet enema Glycerin suppositories Metamucil Milk of magnesia Miralax Senokot Smooth move tea  Diarrhea: Kaopectate Imodium A-D  *NO pepto Bismol  Hemorrhoids: Anusol Anusol HC Preparation H Tucks  Indigestion: Tums Maalox Mylanta Zantac  Pepcid  Insomnia: Benadryl (alcohol free) 25mg  every 6 hours as needed Tylenol PM Unisom, no Gelcaps  Leg Cramps: Tums MagGel  Nausea/Vomiting:  Bonine Dramamine Emetrol Ginger extract Sea bands Meclizine  Nausea medication to take during pregnancy:  Unisom (doxylamine succinate 25 mg tablets) Take one tablet daily at bedtime. If symptoms are not adequately controlled, the dose can be increased to a maximum recommended dose of two tablets daily (1/2 tablet in the morning, 1/2 tablet mid-afternoon and one at bedtime). Vitamin B6 100mg  tablets. Take one tablet twice a day (up to 200 mg per day).  Skin Rashes: Aveeno products Benadryl cream or 25mg  every 6 hours as needed Calamine Lotion 1% cortisone cream  Yeast infection: Gyne-lotrimin 7 Monistat 7   **If taking multiple medications, please check labels to avoid duplicating the same active ingredients **take medication as directed on the  label ** Do not exceed 4000 mg of tylenol in 24 hours **Do not take medications that contain aspirin or ibuprofen

## 2023-07-12 NOTE — MAU Note (Signed)
.  Bonnie Booth is a 23 y.o. at [redacted]w[redacted]d here in MAU reporting: Pt states she woke up at 8am and started feeling "Off" pt states she started feeling dizzy and sweaty. Pt states she also started having a headache that is 10/10 pulsating.  Lower pelvic pain 7/10 cramping.   No bleeding.   Vitals:   07/12/23 1916  BP: 118/66  Pulse: 89  Resp: 17  Temp: 97.7 F (36.5 C)  SpO2: 99%     FHT: 152 via doppler.  Lab orders placed from triage: UA

## 2023-07-13 LAB — GC/CHLAMYDIA PROBE AMP (~~LOC~~) NOT AT ARMC
Chlamydia: NEGATIVE
Comment: NEGATIVE
Comment: NORMAL
Neisseria Gonorrhea: NEGATIVE

## 2023-07-16 ENCOUNTER — Encounter: Payer: Self-pay | Admitting: Obstetrics and Gynecology

## 2023-07-16 ENCOUNTER — Ambulatory Visit (INDEPENDENT_AMBULATORY_CARE_PROVIDER_SITE_OTHER): Payer: Medicaid Other | Admitting: Obstetrics and Gynecology

## 2023-07-16 VITALS — BP 132/68 | HR 103 | Wt 222.0 lb

## 2023-07-16 DIAGNOSIS — Z3A12 12 weeks gestation of pregnancy: Secondary | ICD-10-CM

## 2023-07-16 DIAGNOSIS — O210 Mild hyperemesis gravidarum: Secondary | ICD-10-CM | POA: Diagnosis not present

## 2023-07-16 DIAGNOSIS — O0991 Supervision of high risk pregnancy, unspecified, first trimester: Secondary | ICD-10-CM | POA: Diagnosis not present

## 2023-07-16 DIAGNOSIS — O0990 Supervision of high risk pregnancy, unspecified, unspecified trimester: Secondary | ICD-10-CM

## 2023-07-16 MED ORDER — PROCHLORPERAZINE MALEATE 10 MG PO TABS: 10.0000 mg | ORAL_TABLET | Freq: Four times a day (QID) | ORAL | 3 refills | Status: DC | PRN

## 2023-07-16 NOTE — Progress Notes (Signed)
 INITIAL OBSTETRICAL VISIT Patient name: Bonnie Booth MRN [id number]  Date of birth: Jun 19, 2000 Chief Complaint:   Initial Prenatal Visit  History of Present Illness:   Bonnie Booth is a 23 y.o. G18P1021 African American female at [redacted]w[redacted]d by LMP with an Estimated Date of Delivery: 01/24/24 being seen today for her initial obstetrical visit.  Her obstetrical history is significant for obesity. This is an unplanned pregnancy. She and the father of the baby (FOB) "Lars Mage" live together. She has a support system that consists of her family/friends. Today she reports nausea and vomiting.   Patient's last menstrual period was 04/19/2023 (approximate). Last pap never had.  Review of Systems:   Pertinent items are noted in HPI Denies cramping/contractions, leakage of fluid, vaginal bleeding, abnormal vaginal discharge w/ itching/odor/irritation, headaches, visual changes, shortness of breath, chest pain, abdominal pain, severe nausea/vomiting, or problems with urination or bowel movements unless otherwise stated above.  Pertinent History Reviewed:  Reviewed past medical,surgical, social, obstetrical and family history.  Reviewed problem list, medications and allergies. OB History  Gravida Para Term Preterm AB Living  4 1 1  2 1   SAB IAB Ectopic Multiple Live Births  2 0 0 0 1    # Outcome Date GA Lbr Len/2nd Weight Sex Type Anes PTL Lv  4 Current           3 Term 05/19/19 [redacted]w[redacted]d 02:33 / 02:22 9 lb 0.6 oz (4.099 kg) M Vag-Spont EPI  LIV     Birth Comments: WDL  2 SAB           1 SAB            Physical Assessment:   Vitals:   07/16/23 1322  BP: 132/68  Pulse: (!) 103  Weight: 222 lb (100.7 kg)  Body mass index is 34.77 kg/m.       Physical Examination:  General appearance - well appearing, and in no distress  Mental status - alert, oriented to person, place, and time  Psych:  She has a normal mood and affect  Skin - warm and dry, normal color, no suspicious lesions noted  Chest -  effort normal, all lung fields clear to auscultation bilaterally  Heart - normal rate and regular rhythm  Abdomen - soft, nontender  Extremities:  No swelling or varicosities noted  Pelvic - deferred  FHTs by doppler: 154 bpm  Assessment & Plan:  1) Low-Risk Pregnancy G4W1027 at [redacted]w[redacted]d with an Estimated Date of Delivery: 01/24/24   2) Initial OB visit - Welcomed to practice and introduced self to patient in addition to discussing other advanced practice providers that she may be seeing at this practice - Congratulated patient - Anticipatory guidance on upcoming appointments - Educated on COVID19 and pregnancy and the integration of virtual appointments  - Educated on babyscripts app- patient reports she has not received email, encouraged to look in spam folder and to call office if she still has not received email - patient verbalizes understanding    3) Supervision of high risk pregnancy, antepartum (Primary) - CBC/D/Plt+RPR+Rh+ABO+RubIgG... - Comp Met (CMET) - HgB A1c - PANORAMA PRENATAL TEST   Meds: No orders of the defined types were placed in this encounter.   Initial labs obtained Continue prenatal vitamins Reviewed n/v relief measures and warning s/s to report Reviewed recommended weight gain based on pre-gravid BMI Encouraged well-balanced diet Genetic Screening discussed: ordered Cystic fibrosis, SMA, Fragile X screening discussed ordered The nature of [facility] with multiple MDs and other Advanced Practice Providers was explained to patient; also emphasized that residents, students are part of our team.  Discussed optimized OB schedule and video visits. Advised can have an in-office visit whenever she feels she needs to be seen.  Does not have own BP cuff. Explained to patient that BP will be mailed to her house. Check BP weekly, report BP >140/90. Advised to call during normal business hours and there is an after-hours nurse line  available.   Indications for ASA therapy (per uptodate) One of the following: Previous pregnancy with preeclampsia, especially early onset and with an adverse outcome? No Multifetal gestation? No Chronic hypertension? No Type 1 or 2 diabetes mellitus? No Chronic kidney disease? No Autoimmune disease (antiphospholipid syndrome, systemic lupus erythematosus)? No   Two or more of the following: Nulliparity? No Obesity (body mass index >30 kg/m2)? Yes Family history of preeclampsia in mother or sister? No Age >=35 years? No Sociodemographic characteristics (African American race, low socioeconomic level)? Yes Personal risk factors (eg, previous pregnancy with low birth weight or small for gestational age infant, previous adverse pregnancy outcome [eg, stillbirth], interval >10 years between pregnancies)? No   Indications for early GDM screening  First-degree relative with diabetes? No BMI >30kg/m2? Yes Age > 35 years? No Previous birth of an infant weighing >=4000 g? No Gestational diabetes mellitus in a previous pregnancy? No Glycated hemoglobin >=5.7 percent (39 mmol/mol), impaired glucose tolerance, or impaired fasting glucose on previous testing? No High-risk race/ethnicity (eg, [place] American, Latino, Native [address], [place] American, [place] Islander)? Yes Previous stillbirth of unknown cause? No Maternal birthweight > 9 lbs? No History of cardiovascular disease? No Hypertension or on therapy for hypertension? No High-density lipoprotein cholesterol level <35 mg/dL (4.69 mmol/L) and/or a triglyceride level >250 mg/dL (6.29 mmol/L)? No Polycystic ovarian syndrome (PCOS)? No Physical inactivity? No Other clinical condition associated with insulin resistance (eg, severe obesity, acanthosis nigricans)? No Current use of glucocorticoids? No  Follow-up: Return in about 4 weeks (around 08/13/2023) for Return OB visit.   Orders Placed This Encounter  Procedures    CBC/D/Plt+RPR+Rh+ABO+RubIgG...   Comp Met (CMET)   HgB A1c   PANORAMA PRENATAL TEST    Raelyn Mora MSN, CNM 07/16/2023 2:02 PM

## 2023-07-16 NOTE — Progress Notes (Signed)
 Pt presents for NOB visit. Wants to defer PAP until next visit.

## 2023-07-17 LAB — CBC/D/PLT+RPR+RH+ABO+RUBIGG...
Antibody Screen: NEGATIVE
Basophils Absolute: 0 10*3/uL (ref 0.0–0.2)
Basos: 0 %
EOS (ABSOLUTE): 0.1 10*3/uL (ref 0.0–0.4)
Eos: 1 %
HCV Ab: NONREACTIVE
HIV Screen 4th Generation wRfx: NONREACTIVE
Hematocrit: 39.3 % (ref 34.0–46.6)
Hemoglobin: 12.8 g/dL (ref 11.1–15.9)
Hepatitis B Surface Ag: NEGATIVE
Immature Grans (Abs): 0 10*3/uL (ref 0.0–0.1)
Immature Granulocytes: 0 %
Lymphocytes Absolute: 1.6 10*3/uL (ref 0.7–3.1)
Lymphs: 24 %
MCH: 26.7 pg (ref 26.6–33.0)
MCHC: 32.6 g/dL (ref 31.5–35.7)
MCV: 82 fL (ref 79–97)
Monocytes Absolute: 0.3 10*3/uL (ref 0.1–0.9)
Monocytes: 5 %
Neutrophils Absolute: 4.6 10*3/uL (ref 1.4–7.0)
Neutrophils: 70 %
Platelets: 253 10*3/uL (ref 150–450)
RBC: 4.79 x10E6/uL (ref 3.77–5.28)
RDW: 12.6 % (ref 11.7–15.4)
RPR Ser Ql: NONREACTIVE
Rh Factor: POSITIVE
Rubella Antibodies, IGG: 2.24 {index} (ref >0.99)
WBC: 6.6 10*3/uL (ref 3.4–10.8)

## 2023-07-17 LAB — HEMOGLOBIN A1C
Est. average glucose Bld gHb Est-mCnc: 108 mg/dL
Hgb A1c MFr Bld: 5.4 % (ref 4.8–5.6)

## 2023-07-17 LAB — COMPREHENSIVE METABOLIC PANEL
ALT: 10 IU/L (ref 0–32)
AST: 13 IU/L (ref 0–40)
Albumin: 4 g/dL (ref 4.0–5.0)
Alkaline Phosphatase: 88 IU/L (ref 44–121)
BUN/Creatinine Ratio: 18 (ref 9–23)
BUN: 11 mg/dL (ref 6–20)
Bilirubin Total: 0.2 mg/dL (ref 0.0–1.2)
CO2: 22 mmol/L (ref 20–29)
Calcium: 9.3 mg/dL (ref 8.7–10.2)
Chloride: 101 mmol/L (ref 96–106)
Creatinine, Ser: 0.6 mg/dL (ref 0.57–1.00)
Globulin, Total: 2.9 g/dL (ref 1.5–4.5)
Glucose: 95 mg/dL (ref 70–99)
Potassium: 3.9 mmol/L (ref 3.5–5.2)
Sodium: 136 mmol/L (ref 134–144)
Total Protein: 6.9 g/dL (ref 6.0–8.5)
eGFR: 128 mL/min/{1.73_m2} (ref >59)

## 2023-07-17 LAB — HCV INTERPRETATION

## 2023-07-20 ENCOUNTER — Encounter: Payer: Self-pay | Admitting: Obstetrics and Gynecology

## 2023-07-21 LAB — PANORAMA PRENATAL TEST FULL PANEL:PANORAMA TEST PLUS 5 ADDITIONAL MICRODELETIONS: FETAL FRACTION: 7.3

## 2023-08-01 ENCOUNTER — Encounter: Payer: Self-pay | Admitting: Obstetrics and Gynecology

## 2023-08-01 ENCOUNTER — Encounter: Payer: Self-pay | Admitting: Physician Assistant

## 2023-08-12 ENCOUNTER — Ambulatory Visit: Admitting: Obstetrics

## 2023-08-12 ENCOUNTER — Encounter: Payer: Self-pay | Admitting: Obstetrics

## 2023-08-12 VITALS — BP 138/71 | HR 94 | Wt 225.6 lb

## 2023-08-12 DIAGNOSIS — Z8759 Personal history of other complications of pregnancy, childbirth and the puerperium: Secondary | ICD-10-CM

## 2023-08-12 DIAGNOSIS — O0990 Supervision of high risk pregnancy, unspecified, unspecified trimester: Secondary | ICD-10-CM

## 2023-08-12 DIAGNOSIS — Z8632 Personal history of gestational diabetes: Secondary | ICD-10-CM | POA: Diagnosis not present

## 2023-08-12 DIAGNOSIS — E669 Obesity, unspecified: Secondary | ICD-10-CM

## 2023-08-12 NOTE — Progress Notes (Signed)
 Subjective:  Bonnie Booth is a 23 y.o. [phone] at [redacted]w[redacted]d being seen today for ongoing prenatal care.  She is currently monitored for the following issues for this high-risk pregnancy and has Alpha thalassemia silent carrier; Appendicitis; Acute appendicitis; JIA (juvenile idiopathic arthritis), oligoarthritis, extended (HCC); Positive ANA (antinuclear antibody); Personal history of arthritis; Panic disorder; Adverse food reaction; Seronegative inflammatory arthritis; and Supervision of high risk pregnancy, antepartum on their problem list.  Patient reports no complaints.  Contractions: Not present. Vag. Bleeding: None.  Movement: Present. Denies leaking of fluid.   The following portions of the patient's history were reviewed and updated as appropriate: allergies, current medications, past family history, past medical history, past social history, past surgical history and problem list. Problem list updated.  Objective:   Vitals:   08/12/23 1317  BP: 138/71  Pulse: 94  Weight: 225 lb 9.6 oz (102.3 kg)    Fetal Status: Fetal Heart Rate (bpm): 147   Movement: Present     General:  Alert, oriented and cooperative. Patient is in no acute distress.  Skin: Skin is warm and dry. No rash noted.   Cardiovascular: Normal heart rate noted  Respiratory: Normal respiratory effort, no problems with respiration noted  Abdomen: Soft, gravid, appropriate for gestational age. Pain/Pressure: Present     Pelvic:  Cervical exam deferred        Extremities: Normal range of motion.  Edema: None  Mental Status: Normal mood and affect. Normal behavior. Normal judgment and thought content.   Urinalysis:      Assessment and Plan:  Pregnancy: G4P1021 at [redacted]w[redacted]d  1. Supervision of high risk pregnancy, antepartum (Primary) Rx: - AFP, Serum, Open Spina Bifida  2. H/O pre-eclampsia - taking Baby ASA  3. History of gestational diabetes mellitus (GDM) - needs early GTT  4. Obesity (BMI 35.0-39.9 without  comorbidity)     Preterm labor symptoms and general obstetric precautions including but not limited to vaginal bleeding, contractions, leaking of fluid and fetal movement were reviewed in detail with the patient. Please refer to After Visit Summary for other counseling recommendations.   Return in about 4 weeks (around 09/09/2023) for [facility].   Gabrielle Joiner, MD 08/12/2023

## 2023-08-12 NOTE — Progress Notes (Signed)
 Pt presents for rob. Pt has no questions or concerns at this time.

## 2023-08-14 LAB — AFP, SERUM, OPEN SPINA BIFIDA
AFP MoM: 1.41
AFP Value: 34.5 ng/mL
Gest. Age on Collection Date: 16 wk
Maternal Age At EDD: 25.4 a
OSBR Risk 1 IN: 3501
Test Results:: NEGATIVE
Weight: 250 [lb_av]

## 2023-08-19 ENCOUNTER — Other Ambulatory Visit

## 2023-08-27 DIAGNOSIS — O99210 Obesity complicating pregnancy, unspecified trimester: Secondary | ICD-10-CM | POA: Insufficient documentation

## 2023-08-27 DIAGNOSIS — O09299 Supervision of pregnancy with other poor reproductive or obstetric history, unspecified trimester: Secondary | ICD-10-CM | POA: Insufficient documentation

## 2023-08-27 DIAGNOSIS — Z8632 Personal history of gestational diabetes: Secondary | ICD-10-CM | POA: Insufficient documentation

## 2023-09-02 ENCOUNTER — Ambulatory Visit: Payer: Medicaid Other

## 2023-09-02 ENCOUNTER — Ambulatory Visit: Payer: Medicaid Other | Attending: Family Medicine

## 2023-09-02 DIAGNOSIS — Z8632 Personal history of gestational diabetes: Secondary | ICD-10-CM

## 2023-09-02 DIAGNOSIS — O99210 Obesity complicating pregnancy, unspecified trimester: Secondary | ICD-10-CM

## 2023-09-02 DIAGNOSIS — O09299 Supervision of pregnancy with other poor reproductive or obstetric history, unspecified trimester: Secondary | ICD-10-CM

## 2023-09-09 ENCOUNTER — Encounter: Admitting: Obstetrics and Gynecology

## 2023-09-20 IMAGING — CT CT RENAL STONE PROTOCOL
2 of 4 series · 15 of 46 positions shown, 17 images · non-contrast
Comparison: CT Abdomen and Pelvis 05/19/2021.

CLINICAL DATA: 21-year-old female with right flank pain. Left upper
pole renal calculus in [REDACTED].



[Series 2: stone full · axial · 0.64mm/px · z∈[-441,-6]mm · 12 of 103 slices shown, 14 images]
[im 8/103  soft-tissue]
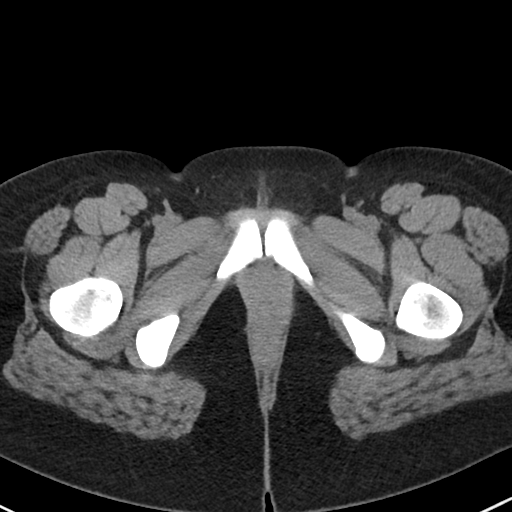
[im 8/103  bone]
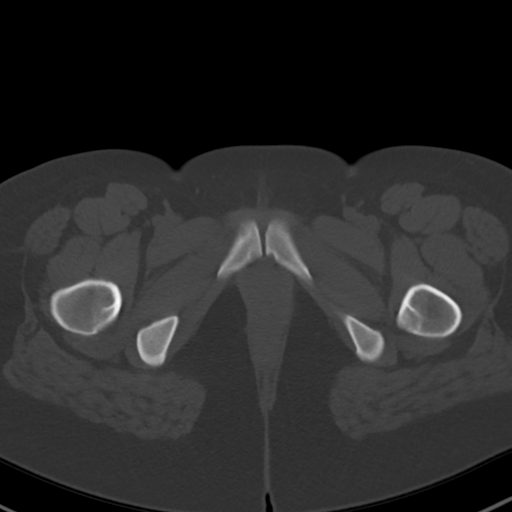
[im 16/103  soft-tissue]
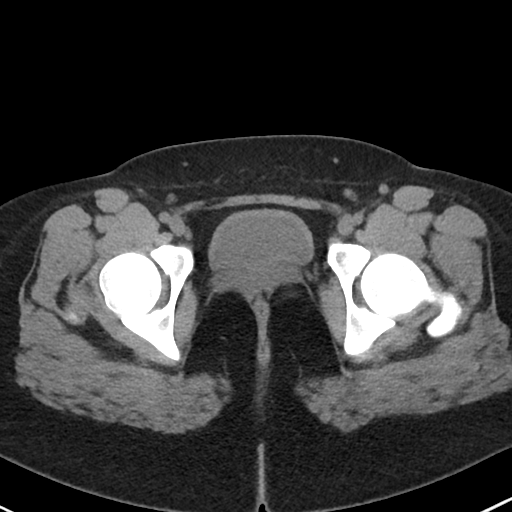
[im 24/103  soft-tissue]
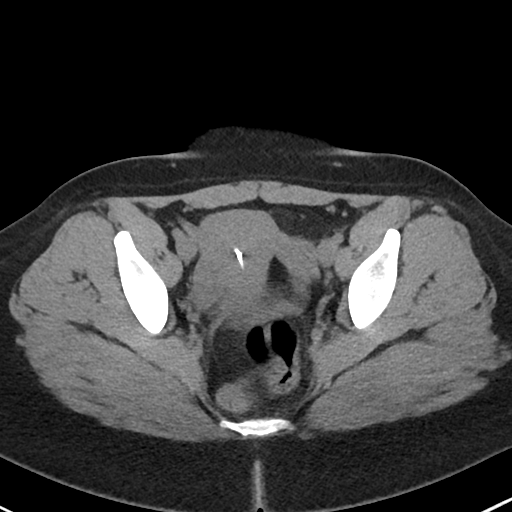
[im 32/103  soft-tissue]
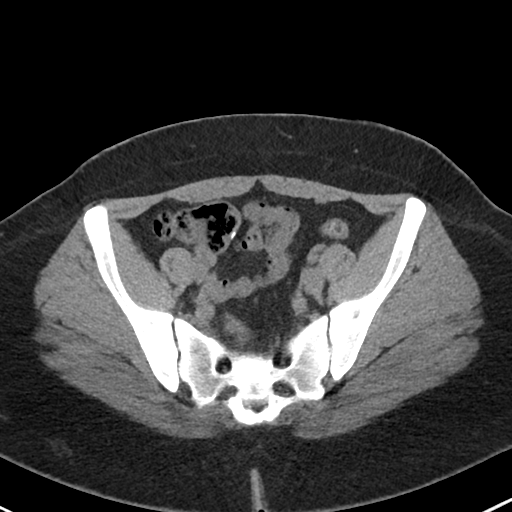
[im 40/103  soft-tissue]
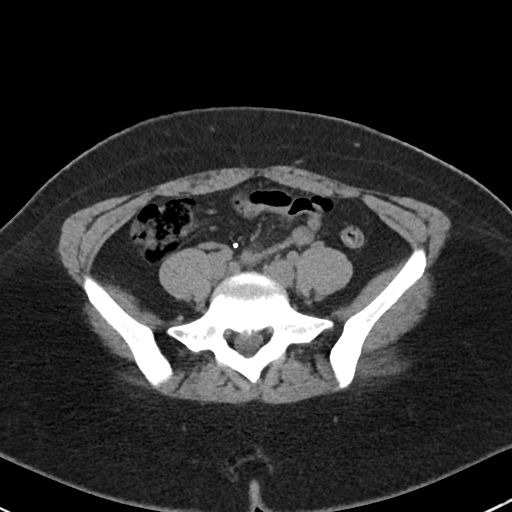
[im 48/103  soft-tissue]
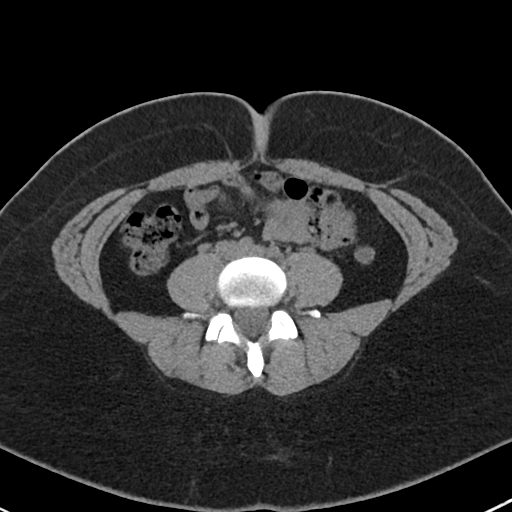
[im 55/103  soft-tissue]
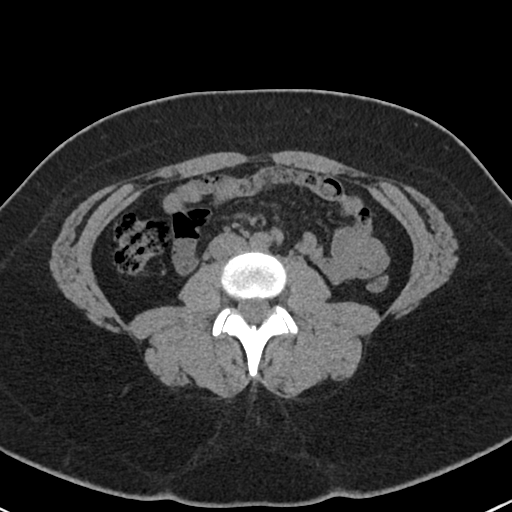
[im 63/103  soft-tissue]
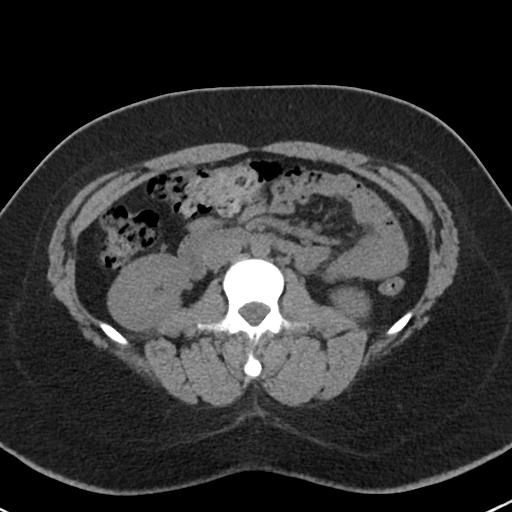
[im 71/103  soft-tissue]
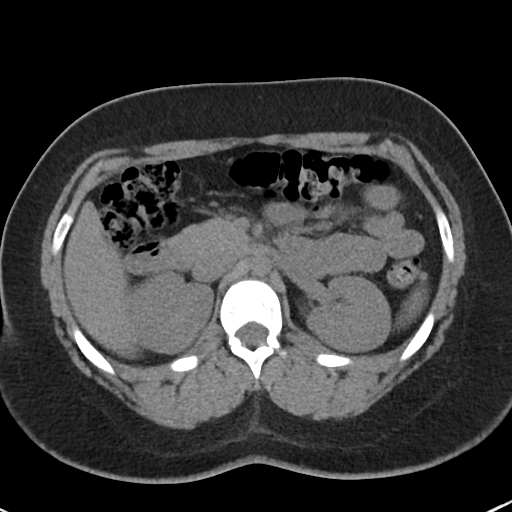
[im 71/103  bone]
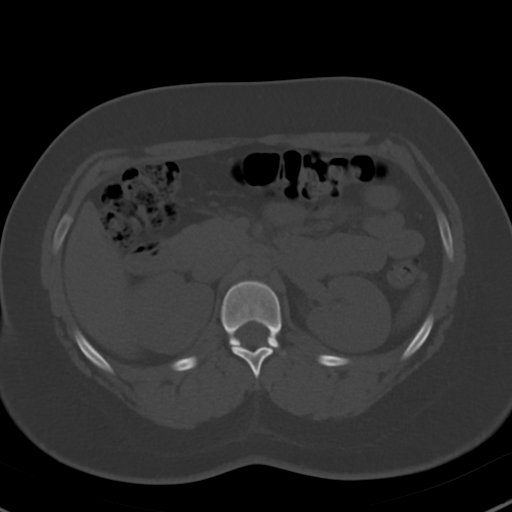
[im 79/103  soft-tissue]
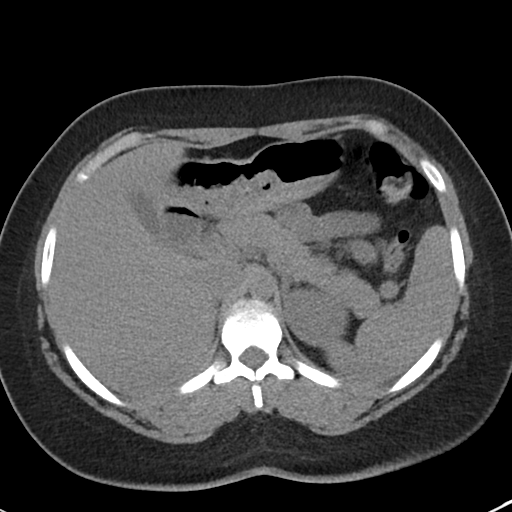
[im 87/103  soft-tissue]
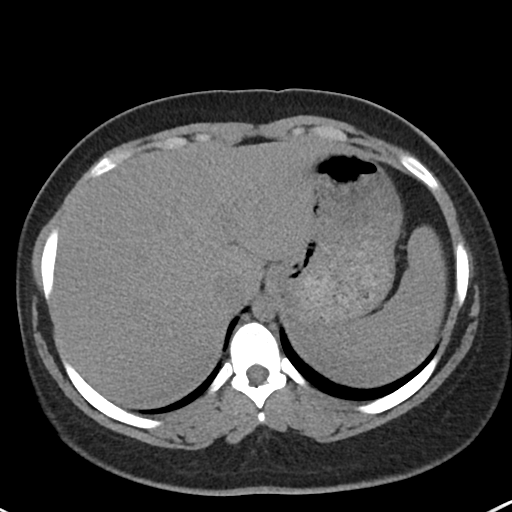
[im 95/103  soft-tissue]
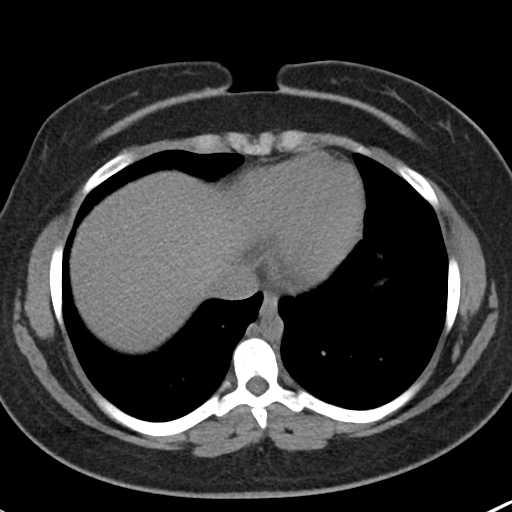

[Series 5: coronal · coronal · 0.66mm/px · 3 of 103 slices shown]
[im 35/103  soft-tissue]
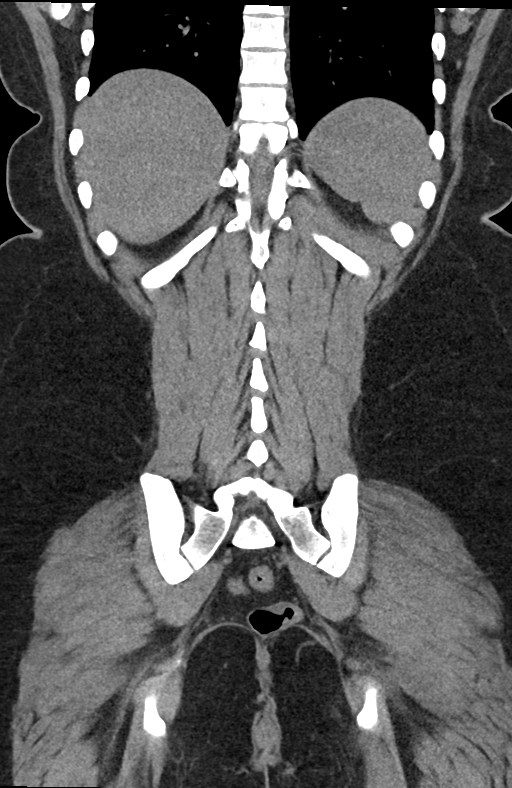
[im 46/103  soft-tissue]
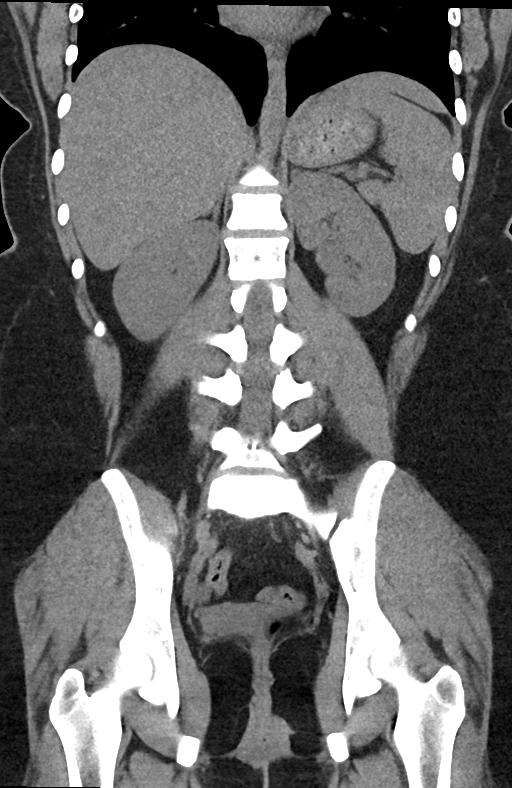
[im 57/103  soft-tissue]
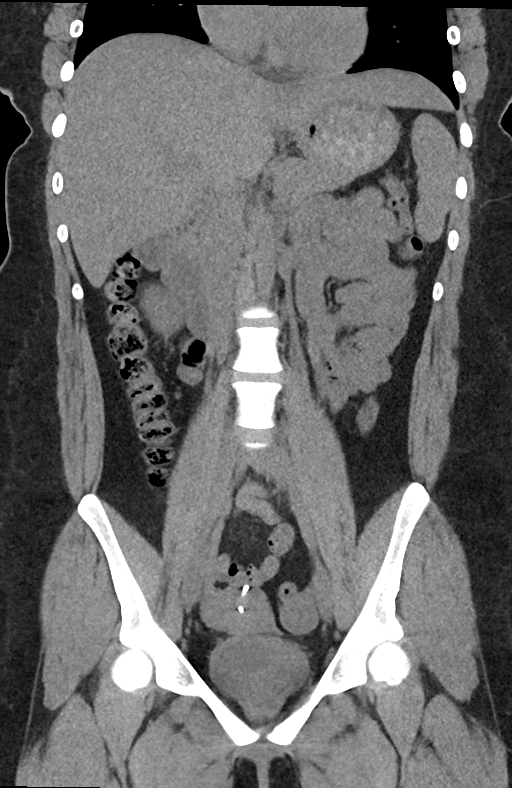

[15 of 46 positions shown; findings below may reference images not displayed]

FINDINGS: Lower chest: Negative.

Hepatobiliary: Negative noncontrast liver and gallbladder.

Pancreas: Negative.

Spleen: Negative.

Adrenals/Urinary Tract: Normal adrenal glands. Noncontrast kidneys
appear nonobstructed, symmetric. No nephrolithiasis visible today.
No pararenal inflammation. Both proximal ureters appear decompressed
and normal. Stable urinary bladder, diminutive.

Stomach/Bowel: Appendectomy on series 2, image 71. Negative large
bowel aside from retained stool. Terminal ileum appears negative. No
dilated small bowel. Negative noncontrast stomach and duodenum. No
free air or free fluid.

Vascular/Lymphatic: Normal caliber abdominal aorta. No calcified
atherosclerosis or lymphadenopathy identified.

Reproductive: Stable noncontrast appearance, with chronically
rotated configuration of the IUD, T arms projecting into the ventral
and dorsal myometrium as before (sagittal images 51 through 54).

Other: No pelvic free fluid.

Musculoskeletal: No acute osseous abnormality identified. Subtle
chronic retrolisthesis of L5 on S1.
IMPRESSION: 1. No acute or inflammatory process identified in the noncontrast
abdomen or pelvis. No urinary calculus identified today. No
obstructive uropathy.
2. Chronically rotated position of the IUD is stable.

## 2023-09-23 ENCOUNTER — Ambulatory Visit: Attending: Obstetrics and Gynecology | Admitting: Obstetrics

## 2023-09-23 ENCOUNTER — Other Ambulatory Visit: Payer: Self-pay | Admitting: *Deleted

## 2023-09-23 ENCOUNTER — Ambulatory Visit

## 2023-09-23 VITALS — BP 102/66 | HR 117

## 2023-09-23 DIAGNOSIS — O99212 Obesity complicating pregnancy, second trimester: Secondary | ICD-10-CM

## 2023-09-23 DIAGNOSIS — M0890 Juvenile arthritis, unspecified, unspecified site: Secondary | ICD-10-CM

## 2023-09-23 DIAGNOSIS — O2692 Pregnancy related conditions, unspecified, second trimester: Secondary | ICD-10-CM | POA: Insufficient documentation

## 2023-09-23 DIAGNOSIS — O99891 Other specified diseases and conditions complicating pregnancy: Secondary | ICD-10-CM

## 2023-09-23 DIAGNOSIS — O09299 Supervision of pregnancy with other poor reproductive or obstetric history, unspecified trimester: Secondary | ICD-10-CM

## 2023-09-23 DIAGNOSIS — E669 Obesity, unspecified: Secondary | ICD-10-CM

## 2023-09-23 DIAGNOSIS — O09292 Supervision of pregnancy with other poor reproductive or obstetric history, second trimester: Secondary | ICD-10-CM | POA: Diagnosis not present

## 2023-09-23 DIAGNOSIS — Z363 Encounter for antenatal screening for malformations: Secondary | ICD-10-CM | POA: Diagnosis not present

## 2023-09-23 DIAGNOSIS — M0840 Pauciarticular juvenile rheumatoid arthritis, unspecified site: Secondary | ICD-10-CM | POA: Diagnosis not present

## 2023-09-23 DIAGNOSIS — Z148 Genetic carrier of other disease: Secondary | ICD-10-CM | POA: Insufficient documentation

## 2023-09-23 DIAGNOSIS — O0990 Supervision of high risk pregnancy, unspecified, unspecified trimester: Secondary | ICD-10-CM

## 2023-09-23 DIAGNOSIS — Z362 Encounter for other antenatal screening follow-up: Secondary | ICD-10-CM

## 2023-09-23 DIAGNOSIS — O99210 Obesity complicating pregnancy, unspecified trimester: Secondary | ICD-10-CM

## 2023-09-23 DIAGNOSIS — Z3A22 22 weeks gestation of pregnancy: Secondary | ICD-10-CM | POA: Diagnosis not present

## 2023-09-23 NOTE — Progress Notes (Signed)
 MFM Consult Note  Bonnie Booth is currently at 22 weeks and 3 days.  She was seen for a detailed fetal anatomy scan due to maternal obesity and history of juvenile idiopathic arthritis.  She is not treated with any medications for her arthritis.  She has tested positive for antinuclear (ANA) antibodies.   Her prior pregnancy was complicated by gestational diabetes and preeclampsia.  She denies any problems in her current pregnancy.  She had a cell free DNA test earlier in her pregnancy which indicated a low risk for trisomy 33, 70, and 13. A female fetus is predicted.   She was informed that the fetal growth and amniotic fluid level were appropriate for her gestational age.   There were no obvious fetal anomalies noted on today's ultrasound exam.  However, today's exam was limited due to the fetal position.  The patient was informed that anomalies may be missed due to technical limitations. If the fetus is in a suboptimal position or maternal habitus is increased, visualization of the fetus in the maternal uterus may be impaired.  Due to her history of of juvenile arthritis and the positive ANA antibody, we will continue to follow her with growth ultrasounds throughout her pregnancy.   She will return in 4 weeks for another growth scan and to complete the views of the fetal anatomy.    The patient stated that all of her questions were answered today.  A total of 30 minutes was spent counseling and coordinating the care for this patient.  Greater than 50% of the time was spent in direct face-to-face contact.

## 2023-10-23 ENCOUNTER — Ambulatory Visit

## 2023-10-23 ENCOUNTER — Ambulatory Visit: Attending: Nurse Practitioner

## 2023-11-02 ENCOUNTER — Ambulatory Visit: Attending: Obstetrics and Gynecology | Admitting: Maternal & Fetal Medicine

## 2023-11-02 ENCOUNTER — Ambulatory Visit (HOSPITAL_BASED_OUTPATIENT_CLINIC_OR_DEPARTMENT_OTHER)

## 2023-11-02 ENCOUNTER — Other Ambulatory Visit: Payer: Self-pay | Admitting: *Deleted

## 2023-11-02 VITALS — BP 120/63 | HR 97

## 2023-11-02 DIAGNOSIS — O99013 Anemia complicating pregnancy, third trimester: Secondary | ICD-10-CM

## 2023-11-02 DIAGNOSIS — D563 Thalassemia minor: Secondary | ICD-10-CM

## 2023-11-02 DIAGNOSIS — M0890 Juvenile arthritis, unspecified, unspecified site: Secondary | ICD-10-CM | POA: Insufficient documentation

## 2023-11-02 DIAGNOSIS — Z3A28 28 weeks gestation of pregnancy: Secondary | ICD-10-CM

## 2023-11-02 DIAGNOSIS — O0990 Supervision of high risk pregnancy, unspecified, unspecified trimester: Secondary | ICD-10-CM

## 2023-11-02 DIAGNOSIS — Z362 Encounter for other antenatal screening follow-up: Secondary | ICD-10-CM

## 2023-11-02 DIAGNOSIS — O09293 Supervision of pregnancy with other poor reproductive or obstetric history, third trimester: Secondary | ICD-10-CM | POA: Diagnosis not present

## 2023-11-02 DIAGNOSIS — M0840 Pauciarticular juvenile rheumatoid arthritis, unspecified site: Secondary | ICD-10-CM

## 2023-11-02 DIAGNOSIS — O2693 Pregnancy related conditions, unspecified, third trimester: Secondary | ICD-10-CM | POA: Insufficient documentation

## 2023-11-02 DIAGNOSIS — O99213 Obesity complicating pregnancy, third trimester: Secondary | ICD-10-CM | POA: Insufficient documentation

## 2023-11-02 DIAGNOSIS — O09299 Supervision of pregnancy with other poor reproductive or obstetric history, unspecified trimester: Secondary | ICD-10-CM

## 2023-11-02 DIAGNOSIS — O99891 Other specified diseases and conditions complicating pregnancy: Secondary | ICD-10-CM

## 2023-11-02 DIAGNOSIS — R768 Other specified abnormal immunological findings in serum: Secondary | ICD-10-CM

## 2023-11-02 DIAGNOSIS — O99210 Obesity complicating pregnancy, unspecified trimester: Secondary | ICD-10-CM

## 2023-11-02 DIAGNOSIS — Z148 Genetic carrier of other disease: Secondary | ICD-10-CM | POA: Insufficient documentation

## 2023-11-02 DIAGNOSIS — Z8632 Personal history of gestational diabetes: Secondary | ICD-10-CM

## 2023-11-02 NOTE — Progress Notes (Signed)
 After review, MFM consult with provider is not indicated for today  Bonnie Nathanel Pipe, MD 11/02/2023 11:19 AM  [facility]

## 2023-11-27 ENCOUNTER — Encounter (HOSPITAL_COMMUNITY): Payer: Self-pay | Admitting: Obstetrics and Gynecology

## 2023-11-27 ENCOUNTER — Inpatient Hospital Stay (HOSPITAL_COMMUNITY)
Admission: AD | Admit: 2023-11-27 | Discharge: 2023-11-27 | Disposition: A | Discharge status: Home / Self Care | Attending: Obstetrics and Gynecology | Admitting: Obstetrics and Gynecology

## 2023-11-27 DIAGNOSIS — H538 Other visual disturbances: Secondary | ICD-10-CM | POA: Diagnosis not present

## 2023-11-27 DIAGNOSIS — B9689 Other specified bacterial agents as the cause of diseases classified elsewhere: Secondary | ICD-10-CM

## 2023-11-27 DIAGNOSIS — N898 Other specified noninflammatory disorders of vagina: Secondary | ICD-10-CM | POA: Insufficient documentation

## 2023-11-27 DIAGNOSIS — R109 Unspecified abdominal pain: Secondary | ICD-10-CM | POA: Diagnosis not present

## 2023-11-27 DIAGNOSIS — O26893 Other specified pregnancy related conditions, third trimester: Secondary | ICD-10-CM | POA: Diagnosis present

## 2023-11-27 DIAGNOSIS — Z3A31 31 weeks gestation of pregnancy: Secondary | ICD-10-CM

## 2023-11-27 LAB — URINALYSIS, ROUTINE W REFLEX MICROSCOPIC
Bilirubin Urine: NEGATIVE
Glucose, UA: NEGATIVE mg/dL
Hgb urine dipstick: NEGATIVE
Ketones, ur: NEGATIVE mg/dL
Leukocytes,Ua: NEGATIVE
Nitrite: NEGATIVE
Protein, ur: NEGATIVE mg/dL
Specific Gravity, Urine: 1.005 (ref 1.005–1.030)
pH: 7 (ref 5.0–8.0)

## 2023-11-27 LAB — WET PREP, GENITAL
Sperm: NONE SEEN
Trich, Wet Prep: NONE SEEN
WBC, Wet Prep HPF POC: >=10 — AB (ref <10)
Yeast Wet Prep HPF POC: NONE SEEN

## 2023-11-27 MED ORDER — ACETAMINOPHEN 325 MG PO TABS
650.0000 mg | ORAL_TABLET | Freq: Once | ORAL | Status: AC
  Administered 2023-11-27: 650 mg via ORAL
  Filled 2023-11-27: qty 2

## 2023-11-27 MED ORDER — METRONIDAZOLE 0.75 % VA GEL: 1.0000 | Freq: Every day | VAGINAL | 1 refills | Status: DC

## 2023-11-27 NOTE — MAU Note (Signed)
 Bonnie Booth is a 23 y.o. at [redacted]w[redacted]d here in MAU reporting: right lower abd pain all day today. Also reports she has a yeast infection and wants to be checked for everything. Reports she is having some burning with urination and frequency. Reports blurry vision for the last 4 days. Has checked her b/p at home but doesn't know what it was. Feels short of breath at times.  Onset of complaint: today Pain score: 7/10 Vitals:   11/27/23 2136  BP: 125/65  Pulse: 96  Resp: 17  Temp: 98.7 F (37.1 C)  SpO2: 100%     FHT: 158  Lab orders placed from triage:

## 2023-11-27 NOTE — MAU Provider Note (Signed)
 History     CSN: [id number]  Arrival date and time: 11/27/23 2121   None     Chief Complaint  Patient presents with   Abdominal Pain   Blurred Vision   Bonnie Booth is a 23 y.o. H5E8978 at [redacted]w[redacted]d who receives care at [facility].  However, she reports has not had an appt since ~ 20 weeks.  She presents today for abdominal pain.  She states the pain started around 1600-1700 and is constant.  She describes it as a stuck or hard feeling.  She reports it is worse with movement, but denies relieving factors. She states she did notice improvement in pain with drinking water  and warm bath.  She states prior to that it was a 9/10, but is now 6/10. She denies contractions. She reports vaginal discharge and thinks she has a yeast infection.    OB History     Gravida  4   Para  1   Term  1   Preterm      AB  2   Living  1      SAB  2   IAB  0   Ectopic  0   Multiple  0   Live Births  1           Past Medical History:  Diagnosis Date   Acute appendicitis 06/20/2020   Adverse food reaction 01/17/2013   Formatting of this note might be different from the original.  IMPRESSION: keep appt for allergist, has zyrtec  at home to use prn. avoid tomatoes. labs given to take to allergist. has epi pen  Formatting of this note might be different from the original.  Overview:   IMPRESSION: keep appt for allergist, has zyrtec  at home to use prn. avoid tomatoes. labs given to take to allergist. 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   Supervision of other normal pregnancy, antepartum 11/04/2018              Nursing Staff    Provider      Office Location     [facility]    Dating     8 wk US       Language     English    Anatomy US       normal female      Flu Vaccine     02/02/2019    Genetic Screen     NIPS: Low risk Female   AFP:  Negative          TDaP vaccine      03/15/2019    Hgb A1C or   GTT    Early   Third trimester       Rhogam     O positive          LAB RESULTS       Feeding Plan    Brea    Past Surgical History:  Procedure Laterality Date   APPENDECTOMY  05/2020   LAPAROSCOPIC  APPENDECTOMY N/A 06/21/2020   Procedure: APPENDECTOMY LAPAROSCOPIC;  Surgeon: Stechschulte, Deward PARAS, MD;  Location: [facility];  Service: General;  Laterality: N/A;    Family History  Problem Relation Age of Onset   Healthy Son     Social History   Tobacco Use   Smoking status: Never   Smokeless tobacco: Never  Vaping Use   Vaping status: Never Used  Substance Use Topics   Alcohol use: No   Drug use: Not Currently    Allergies:  Allergies  Allergen Reactions   Apple Anaphylaxis and Swelling    Patient's throat swells   Apple Juice Anaphylaxis and Swelling    Patient's throat swells   Bee Venom Anaphylaxis and Swelling    Patient's throat swells   Cinnamon Anaphylaxis and Swelling    Patient's throat swells   Mango Flavor [Flavoring Agent (Non-Screening)] Anaphylaxis and Swelling    Patient's throat swells   Pineapple Anaphylaxis and Swelling    Patient's throat swells   Prunus Persica Anaphylaxis, Rash and Swelling   Tomato Anaphylaxis and Nausea And Vomiting   Peach Flavoring Agent (Non-Screening)     Medications Prior to Admission  Medication Sig Dispense Refill Last Dose/Taking   Prenat-FeAsp-Meth-FA-DHA w/o A (PRENATE PIXIE ) 10-0.6-0.4-200 MG CAPS Take 1 tablet by mouth daily. 30 capsule 11 11/26/2023   acetaminophen  (TYLENOL ) 500 MG tablet Take 2 tablets (1,000 mg total) by mouth every 8 (eight) hours as  needed for headache. (Patient not taking: Reported on 08/12/2023) 100 tablet 0    polyethylene glycol powder (GLYCOLAX /MIRALAX ) 17 GM/SCOOP powder Take 17 g by mouth daily as needed for moderate constipation. (Patient not taking: Reported on 08/12/2023) 255 g 0    prochlorperazine  (COMPAZINE ) 10 MG tablet Take 1 tablet (10 mg total) by mouth every 6 (six) hours as needed for nausea or vomiting. Take in daytime. Wait 6 hours before taking Promethazine  (Patient not taking: Reported on 08/12/2023) 30 tablet 3    promethazine  (PHENERGAN ) 25 MG tablet Take 1 tablet (25 mg total) by mouth every 6 (six) hours as needed for nausea or vomiting. (Patient not taking: Reported on 08/12/2023) 30 tablet 1    senna (SENOKOT) 8.6 MG TABS tablet Take 2 tablets (17.2 mg total) by mouth daily as needed for mild constipation. (Patient not taking: Reported on 08/12/2023) 120 tablet 0     Review of Systems  Gastrointestinal:  Positive for abdominal pain and constipation (States chronic issue).  Genitourinary:  Positive for dysuria (Yesterday) and frequency (Feels it is more than usual despite pregnancy). Negative for difficulty urinating, vaginal bleeding and vaginal discharge.   Physical Exam   Blood pressure 103/60, pulse 100, temperature 98.7 F (37.1 C), temperature source Oral, resp. rate 17, height 5' 7 (1.702 m), weight 99.8 kg, last menstrual period 04/19/2023, SpO2 100%.  Physical Exam Vitals and nursing note reviewed. Exam conducted with a chaperone present Zenda, RN).  Constitutional:      Appearance: Normal appearance. She is well-developed.  HENT:     Head: Normocephalic and atraumatic.  Eyes:     Conjunctiva/sclera: Conjunctivae normal.  Cardiovascular:     Rate and Rhythm: Normal rate.  Pulmonary:     Effort: Pulmonary effort is normal. No respiratory distress.  Abdominal:     Comments: Gravid, Appears AGA  Genitourinary:    Comments: Dilation: Closed Exam by:: Angla Delahunt,CNM  Musculoskeletal:         General: Normal range of motion.     Cervical back: Normal range of  motion.  Skin:    General: Skin is warm and dry.  Neurological:     Mental Status: She is alert and oriented to person, place, and time.  Psychiatric:        Mood and Affect: Mood normal.        Behavior: Behavior normal.     Fetal Assessment 135 bpm, Mod Var, -Decels, -Accels Toco: Irregular  MAU Course   Results for orders placed or performed during the hospital encounter of 11/27/23 (from the past 24 hours)  Urinalysis, Routine w reflex microscopic -Urine, Clean Catch     Status: None   Collection Time: 11/27/23 10:09 PM  Result Value Ref Range   Color, Urine YELLOW YELLOW   APPearance CLEAR CLEAR   Specific Gravity, Urine 1.005 1.005 - 1.030   pH 7.0 5.0 - 8.0   Glucose, UA NEGATIVE NEGATIVE mg/dL   Hgb urine dipstick NEGATIVE NEGATIVE   Bilirubin Urine NEGATIVE NEGATIVE   Ketones, ur NEGATIVE NEGATIVE mg/dL   Protein, ur NEGATIVE NEGATIVE mg/dL   Nitrite NEGATIVE NEGATIVE   Leukocytes,Ua NEGATIVE NEGATIVE  Wet prep, genital     Status: Abnormal   Collection Time: 11/27/23 10:21 PM   Specimen: Vaginal  Result Value Ref Range   Yeast Wet Prep HPF POC NONE SEEN NONE SEEN   Trich, Wet Prep NONE SEEN NONE SEEN   Clue Cells Wet Prep HPF POC PRESENT (A) NONE SEEN   WBC, Wet Prep HPF POC >=10 (A) <10   Sperm NONE SEEN    No results found.   MDM PE Labs: Wet prep, GC/CT EFM Analgesic Assessment and Plan   23 year old G4P1021  SIUP at 31.5 weeks Cat I FT Abdominal Pain  -Labs ordered and as above. -Provider reviews finding with patient. -Discussed treatment of BV and how this can contribute to abdominal cramping/discomfort in pregnancy. -Rx for metrogel  to be sent.  -Patient offered and accepts pain medication. -Will give tylenol . -Exam performed and findings discussed. -NST reassuring for GA.  -Patient informed of need to call office and restart PNC. Information provided in AVS.   -Precautions reviewed. -Encouraged to call primary office or return to MAU if symptoms worsen or with the onset of new symptoms. -Discharged to home in stable condition.   Harlene LITTIE Duncans MSN, CNM 11/27/2023, 10:55 PM

## 2023-11-29 ENCOUNTER — Inpatient Hospital Stay (HOSPITAL_COMMUNITY)
Admission: AD | Admit: 2023-11-29 | Discharge: 2023-11-29 | Disposition: A | Discharge status: Home / Self Care | Attending: Obstetrics and Gynecology | Admitting: Obstetrics and Gynecology

## 2023-11-29 ENCOUNTER — Encounter (HOSPITAL_COMMUNITY): Payer: Self-pay | Admitting: Obstetrics and Gynecology

## 2023-11-29 DIAGNOSIS — R109 Unspecified abdominal pain: Secondary | ICD-10-CM | POA: Diagnosis present

## 2023-11-29 DIAGNOSIS — Z3689 Encounter for other specified antenatal screening: Secondary | ICD-10-CM

## 2023-11-29 DIAGNOSIS — B9689 Other specified bacterial agents as the cause of diseases classified elsewhere: Secondary | ICD-10-CM | POA: Diagnosis not present

## 2023-11-29 DIAGNOSIS — Z3A32 32 weeks gestation of pregnancy: Secondary | ICD-10-CM | POA: Diagnosis not present

## 2023-11-29 DIAGNOSIS — O4693 Antepartum hemorrhage, unspecified, third trimester: Secondary | ICD-10-CM | POA: Diagnosis not present

## 2023-11-29 DIAGNOSIS — O10913 Unspecified pre-existing hypertension complicating pregnancy, third trimester: Secondary | ICD-10-CM | POA: Diagnosis not present

## 2023-11-29 DIAGNOSIS — O23593 Infection of other part of genital tract in pregnancy, third trimester: Secondary | ICD-10-CM | POA: Insufficient documentation

## 2023-11-29 DIAGNOSIS — O09293 Supervision of pregnancy with other poor reproductive or obstetric history, third trimester: Secondary | ICD-10-CM | POA: Insufficient documentation

## 2023-11-29 DIAGNOSIS — O26853 Spotting complicating pregnancy, third trimester: Secondary | ICD-10-CM | POA: Diagnosis not present

## 2023-11-29 DIAGNOSIS — N760 Acute vaginitis: Secondary | ICD-10-CM

## 2023-11-29 NOTE — MAU Note (Signed)
 Bonnie Booth is a 23 y.o. at [redacted]w[redacted]d here in MAU reporting: arrived via EMS with ABD pain and scant vaginal bleeding that started at 1930., denies CTX, describes pain as cramping in lower ABD. Pt states bleeding and ABD pain began after pt was in bath and placing vaginal cream inside vagina with her fingers. Denies LOF, States DFM. LMP: na Onset of complaint: 1930 Pain score: 6/10 pain in lower ABD Vitals:   11/29/23 2015 11/29/23 2018  Resp: 16   Temp:  98 F (36.7 C)     FHT: 130  Lab orders placed from triage: na

## 2023-11-29 NOTE — Discharge Instructions (Signed)
 You were seen in the maternity assessment unit for intermittent vaginal bleeding.  On your exam there was no blood in your vagina and we did not find the source of any bleeding.  Your monitoring while you are here was also normal and your baby was moving well.  Come to the MAU (maternity admission unit) for 1) Strong contractions every 2-3 minutes for at least 1 hour that do not go away when you drink water  or take a warm shower. These contractions will be so strong all you can do is breath through them 2) Vaginal bleeding- anything more than spotting 3) Loss of fluid like you broke your water  4) Decreased movement of your baby  Or any other pregnancy related concern

## 2023-11-29 NOTE — MAU Provider Note (Addendum)
 History     CSN: [id number]  Arrival date and time: 11/29/23 2005   Event Date/Time   First Provider Initiated Contact with Patient 11/29/23 2208      Chief Complaint  Patient presents with   Abdominal Pain   Vaginal Bleeding   Abdominal Pain Pertinent negatives include no diarrhea, dysuria, fever, nausea or vomiting.  Vaginal Bleeding Associated symptoms include abdominal pain. Pertinent negatives include no back pain, chills, diarrhea, dysuria, fever, flank pain, nausea, rash, sore throat or vomiting.   23 y.o. [id number] [redacted]w[redacted]d here with complaints of spotting. Noticed when she was on the toilet. She started treatment for BV after 8/1 MAU visit. Has cervical exam check on 8/1. She reports the blood was on her hand and toilet but was minimal  +FM, denies LOF, VB, contractions, vaginal discharge.  OB History     Gravida  4   Para  1   Term  1   Preterm      AB  2   Living  1      SAB  2   IAB  0   Ectopic  0   Multiple  0   Live Births  1           Past Medical History:  Diagnosis Date   Acute appendicitis 06/20/2020   Adverse food reaction 01/17/2013   Formatting of this note might be different from the original.  IMPRESSION: keep appt for allergist, has zyrtec  at home to use prn. avoid tomatoes. labs given to take to allergist. has epi pen  Formatting of this note might be different from the original.  Overview:   IMPRESSION: keep appt for allergist, has zyrtec  at home to use prn. avoid tomatoes. labs given to take to allergist. 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   Supervision of other normal pregnancy, antepartum 11/04/2018              Nursing Staff    Provider      Office Location     [facility]    Dating     8 wk US       Language     English    Anatomy US       normal female      Flu Vaccine     02/02/2019    Genetic Screen     NIPS: Low risk Female   AFP:  Negative          TDaP vaccine      03/15/2019    Hgb A1C or   GTT    Early   Third trimester       Rhogam     O positive          LAB RESULTS       Feeding Plan    Brea    Past Surgical History:  Procedure Laterality Date   APPENDECTOMY  05/2020   LAPAROSCOPIC APPENDECTOMY N/A 06/21/2020   Procedure: APPENDECTOMY LAPAROSCOPIC;  Surgeon: Lyndel Deward PARAS, MD;  Location: [facility];  Service: General;  Laterality: N/A;    Family History  Problem Relation Age of Onset   Healthy Son     Social History   Tobacco Use   Smoking status: Never   Smokeless tobacco: Never  Vaping Use   Vaping status: Never Used  Substance Use Topics   Alcohol use: No   Drug use: Not Currently    Allergies:  Allergies  Allergen Reactions   Apple Anaphylaxis and Swelling    Patient's throat swells   Apple Juice Anaphylaxis and Swelling    Patient's throat swells   Bee Venom Anaphylaxis and Swelling    Patient's throat swells   Cinnamon Anaphylaxis and Swelling    Patient's throat swells   Mango Flavor [Flavoring Agent (Non-Screening)] Anaphylaxis and Swelling    Patient's throat swells   Pineapple Anaphylaxis and Swelling    Patient's throat swells   Prunus Persica Anaphylaxis, Rash and Swelling   Tomato Anaphylaxis and Nausea And Vomiting   Peach Flavoring Agent (Non-Screening)     Medications Prior to Admission  Medication Sig Dispense Refill Last Dose/Taking   acetaminophen  (TYLENOL ) 500 MG tablet Take 2 tablets (1,000 mg total) by mouth every 8 (eight) hours as needed for headache. 100 tablet 0 Past Week   metroNIDAZOLE   (METROGEL ) 0.75 % vaginal gel Place 1 Applicatorful vaginally at bedtime. Apply one applicatorful to vagina at bedtime for 5 days 70 g 1 11/29/2023 Evening   Prenat-FeAsp-Meth-FA-DHA w/o A (PRENATE PIXIE ) 10-0.6-0.4-200 MG CAPS Take 1 tablet by mouth daily. 30 capsule 11 11/29/2023 Morning   polyethylene glycol powder (GLYCOLAX /MIRALAX ) 17 GM/SCOOP powder Take 17 g by mouth daily as needed for moderate constipation. (Patient not taking: Reported on 07/16/2023) 255 g 0 More than a month   senna (SENOKOT) 8.6 MG TABS tablet Take 2 tablets (17.2 mg total) by mouth daily as needed for mild constipation. (Patient not taking: Reported on 07/16/2023) 120 tablet 0     Review of Systems  Constitutional:  Negative for chills and fever.  HENT:  Negative for congestion and sore throat.   Eyes:  Negative for pain and visual disturbance.  Respiratory:  Negative for cough, chest tightness and shortness of breath.   Cardiovascular:  Negative for chest pain.  Gastrointestinal:  Positive for abdominal pain. Negative for diarrhea, nausea and vomiting.  Endocrine: Negative for cold intolerance and heat intolerance.  Genitourinary:  Positive for vaginal bleeding. Negative for dysuria and flank pain.  Musculoskeletal:  Negative for back pain.  Skin:  Negative for rash.  Allergic/Immunologic: Negative for food allergies.  Neurological:  Negative for dizziness and light-headedness.  Psychiatric/Behavioral:  Negative for agitation.    Physical Exam   Blood pressure (!) 109/45, pulse 99, temperature 98 F (36.7 C), temperature source Oral, resp. rate 16, height 5' 7 (1.702 m), weight 99.8 kg, last menstrual period 04/19/2023, SpO2 100%.  Physical Exam Vitals and nursing note reviewed. Exam conducted with a chaperone present.  Constitutional:      General: She is not in acute distress.    Appearance: She is well-developed.     Comments: Pregnant female  HENT:     Head: Normocephalic and atraumatic.  Eyes:      General: No scleral icterus.    Conjunctiva/sclera: Conjunctivae normal.  Cardiovascular:     Rate and Rhythm: Normal rate.  Pulmonary:     Effort: Pulmonary effort is normal.  Chest:     Chest wall:  No tenderness.  Abdominal:     Palpations: Abdomen is soft.     Tenderness: There is no abdominal tenderness. There is no guarding or rebound.     Comments: Gravid  Genitourinary:    General: Normal vulva.     Exam position: Lithotomy position.     Vagina: Normal. No bleeding.     Cervix: Normal. No cervical bleeding.     Comments: No blood in vault Minimal discharge Visually closed Musculoskeletal:        General: Normal range of motion.     Cervical back: Normal range of motion and neck supple.  Skin:    General: Skin is warm and dry.     Findings: No rash.  Neurological:     Mental Status: She is alert and oriented to person, place, and time.    Dilation: Closed Effacement (%): Thick Cervical Position: Posterior Station: Ballotable Exam by:: Suzen Masters MD   MAU Course  Procedures I reviewed the patient's fetal monitoring.  Baseline HR: 145 Variability:  moderate Accels:present Decels: none  A/P: Reactive NST  Reassured regarding fetal status.   MDM- moderate Recently seen in MAU, diagnosed with BV and on treatment   Assessment and Plan   1. Spotting affecting pregnancy in third trimester   2. NST (non-stress test) reactive   3. Bacterial vaginosis   4. [redacted] weeks gestation of pregnancy   - Most likely mild irritation from BV, no bleeding in vault - Discharged, stable condition - Return precautions - Routine follow up  Future Appointments  Date Time Provider Department Center  11/30/2023 11:00 AM [facility] PROVIDER 1 [facility]  11/30/2023 11:30 AM [facility]      Suzen Octave Rheanna Sergent 11/29/2023, 10:19 PM

## 2023-11-30 ENCOUNTER — Other Ambulatory Visit: Payer: Self-pay | Admitting: *Deleted

## 2023-11-30 ENCOUNTER — Ambulatory Visit (HOSPITAL_BASED_OUTPATIENT_CLINIC_OR_DEPARTMENT_OTHER)

## 2023-11-30 ENCOUNTER — Ambulatory Visit (HOSPITAL_BASED_OUTPATIENT_CLINIC_OR_DEPARTMENT_OTHER): Attending: Maternal & Fetal Medicine | Admitting: Maternal & Fetal Medicine

## 2023-11-30 DIAGNOSIS — O0933 Supervision of pregnancy with insufficient antenatal care, third trimester: Secondary | ICD-10-CM | POA: Insufficient documentation

## 2023-11-30 DIAGNOSIS — O09299 Supervision of pregnancy with other poor reproductive or obstetric history, unspecified trimester: Secondary | ICD-10-CM

## 2023-11-30 DIAGNOSIS — O99213 Obesity complicating pregnancy, third trimester: Secondary | ICD-10-CM | POA: Diagnosis not present

## 2023-11-30 DIAGNOSIS — M0840 Pauciarticular juvenile rheumatoid arthritis, unspecified site: Secondary | ICD-10-CM | POA: Diagnosis present

## 2023-11-30 DIAGNOSIS — Z148 Genetic carrier of other disease: Secondary | ICD-10-CM | POA: Insufficient documentation

## 2023-11-30 DIAGNOSIS — Z362 Encounter for other antenatal screening follow-up: Secondary | ICD-10-CM | POA: Diagnosis not present

## 2023-11-30 DIAGNOSIS — E669 Obesity, unspecified: Secondary | ICD-10-CM | POA: Diagnosis not present

## 2023-11-30 DIAGNOSIS — O3663X0 Maternal care for excessive fetal growth, third trimester, not applicable or unspecified: Secondary | ICD-10-CM | POA: Diagnosis not present

## 2023-11-30 DIAGNOSIS — O26893 Other specified pregnancy related conditions, third trimester: Secondary | ICD-10-CM | POA: Insufficient documentation

## 2023-11-30 DIAGNOSIS — Z3A32 32 weeks gestation of pregnancy: Secondary | ICD-10-CM

## 2023-11-30 DIAGNOSIS — O3660X0 Maternal care for excessive fetal growth, unspecified trimester, not applicable or unspecified: Secondary | ICD-10-CM

## 2023-11-30 DIAGNOSIS — O09293 Supervision of pregnancy with other poor reproductive or obstetric history, third trimester: Secondary | ICD-10-CM | POA: Diagnosis not present

## 2023-11-30 DIAGNOSIS — O0990 Supervision of high risk pregnancy, unspecified, unspecified trimester: Secondary | ICD-10-CM

## 2023-11-30 DIAGNOSIS — O99210 Obesity complicating pregnancy, unspecified trimester: Secondary | ICD-10-CM

## 2023-11-30 DIAGNOSIS — D563 Thalassemia minor: Secondary | ICD-10-CM

## 2023-11-30 DIAGNOSIS — O99013 Anemia complicating pregnancy, third trimester: Secondary | ICD-10-CM

## 2023-11-30 LAB — GC/CHLAMYDIA PROBE AMP (~~LOC~~) NOT AT ARMC
Chlamydia: NEGATIVE
Comment: NEGATIVE
Comment: NORMAL
Neisseria Gonorrhea: NEGATIVE

## 2023-11-30 NOTE — Progress Notes (Signed)
 Patient information  Patient Name: Bonnie Booth  Patient MRN:   [id number]  Referring practice: MFM Referring Provider: [facility]  Problem List   Patient Active Problem List   Diagnosis Date Noted   Obesity affecting pregnancy, antepartum 08/27/2023   History of pre-eclampsia in prior pregnancy, currently pregnant 08/27/2023   History of gestational diabetes mellitus (GDM) in prior pregnancy, currently pregnant 08/27/2023   Supervision of high risk pregnancy, antepartum 06/10/2023   JIA (juvenile idiopathic arthritis), oligoarthritis, extended (HCC) 11/22/2020   Positive ANA (antinuclear antibody) 11/22/2020   Alpha thalassemia silent carrier 12/02/2018   Personal history of arthritis 01/17/2013   Panic disorder 01/17/2013    Maternal Fetal medicine Consult  Bonnie Booth is a 23 y.o. H5E8978 at [redacted]w[redacted]d here for ultrasound and consultation. Bonnie Booth is doing well today with no acute concerns. Today we focused on the following:   I discussed my concern for possible gestational diabetes based on her history of having gestational diabetes now with an LGA fetus.  I encouraged her to set up her GTT testing as soon as possible and to have antenatal testing until it is ruled out that she does not have gestational diabetes.  The patient had time to ask questions that were answered to her satisfaction.  She verbalized understanding and agrees to proceed with the plan below.  Sonographic findings Single intrauterine pregnancy at 32w 1d.  Fetal cardiac activity:  Observed and appears normal. Presentation: Cephalic. Interval fetal anatomy appears normal. Fetal biometry shows the estimated fetal weight at the 96 percentile. Amniotic fluid volume: Subjectively upper-normal. MVP: 8.15 cm. Placenta: Anterior.  There are limitations of prenatal ultrasound such as the inability to detect certain abnormalities due to poor visualization. Various factors such as fetal position,  gestational age and maternal body habitus may increase the difficulty in visualizing the fetal anatomy.    Recommendations 1. Serial growth ultrasounds every 4-6 weeks until delivery 2. Antenatal testing to start around 32 weeks was ordered due to hx of GDM and current lack of GTT testing.  3. GTT asap  Review of Systems: A review of systems was performed and was negative except per HPI   Vitals and Physical Exam    11/30/2023   11:02 AM 11/29/2023   10:40 PM 11/29/2023    9:45 PM  Vitals with BMI  Systolic 111 130 890  Diastolic 76 66 45  Pulse 101 101 99    Sitting comfortably on the sonogram table Nonlabored breathing Normal rate and rhythm Abdomen is nontender  Past pregnancies OB History  Gravida Para Term Preterm AB Living  4 1 1  2 1   SAB IAB Ectopic Multiple Live Births  2 0 0 0 1    # Outcome Date GA Lbr Len/2nd Weight Sex Type Anes PTL Lv  4 Current           3 Term 05/19/19 [redacted]w[redacted]d 02:33 / 02:22 9 lb 0.6 oz (4.099 kg) M Vag-Spont EPI  LIV     Birth Comments: WDL  2 SAB           1 SAB              I spent 20 minutes reviewing the patients chart, including labs and images as well as counseling the patient about her medical conditions. Greater than 50% of the time was spent in direct face-to-face patient counseling.  Delora Smaller  MFM, [place]   11/30/2023  3:41 PM

## 2023-12-07 ENCOUNTER — Encounter: Payer: Self-pay | Admitting: Obstetrics

## 2023-12-07 ENCOUNTER — Other Ambulatory Visit

## 2023-12-14 ENCOUNTER — Other Ambulatory Visit: Payer: Self-pay | Admitting: *Deleted

## 2023-12-14 DIAGNOSIS — R768 Other specified abnormal immunological findings in serum: Secondary | ICD-10-CM

## 2023-12-14 DIAGNOSIS — O3660X0 Maternal care for excessive fetal growth, unspecified trimester, not applicable or unspecified: Secondary | ICD-10-CM

## 2023-12-14 DIAGNOSIS — O0990 Supervision of high risk pregnancy, unspecified, unspecified trimester: Secondary | ICD-10-CM

## 2023-12-14 DIAGNOSIS — O09299 Supervision of pregnancy with other poor reproductive or obstetric history, unspecified trimester: Secondary | ICD-10-CM

## 2023-12-15 ENCOUNTER — Other Ambulatory Visit

## 2023-12-15 ENCOUNTER — Encounter: Payer: Self-pay | Admitting: Obstetrics and Gynecology

## 2023-12-16 ENCOUNTER — Other Ambulatory Visit

## 2023-12-21 ENCOUNTER — Encounter (HOSPITAL_COMMUNITY): Payer: Self-pay | Admitting: Obstetrics and Gynecology

## 2023-12-21 ENCOUNTER — Other Ambulatory Visit: Payer: Self-pay

## 2023-12-21 ENCOUNTER — Inpatient Hospital Stay (HOSPITAL_COMMUNITY)
Admission: AD | Admit: 2023-12-21 | Discharge: 2023-12-21 | Disposition: A | Discharge status: Home / Self Care | Attending: Obstetrics and Gynecology | Admitting: Obstetrics and Gynecology

## 2023-12-21 DIAGNOSIS — O26893 Other specified pregnancy related conditions, third trimester: Secondary | ICD-10-CM | POA: Insufficient documentation

## 2023-12-21 DIAGNOSIS — O09293 Supervision of pregnancy with other poor reproductive or obstetric history, third trimester: Secondary | ICD-10-CM | POA: Diagnosis not present

## 2023-12-21 DIAGNOSIS — O0993 Supervision of high risk pregnancy, unspecified, third trimester: Secondary | ICD-10-CM

## 2023-12-21 DIAGNOSIS — Z3A35 35 weeks gestation of pregnancy: Secondary | ICD-10-CM | POA: Diagnosis not present

## 2023-12-21 DIAGNOSIS — R519 Headache, unspecified: Secondary | ICD-10-CM | POA: Diagnosis not present

## 2023-12-21 DIAGNOSIS — D509 Iron deficiency anemia, unspecified: Secondary | ICD-10-CM | POA: Diagnosis not present

## 2023-12-21 DIAGNOSIS — O99013 Anemia complicating pregnancy, third trimester: Secondary | ICD-10-CM | POA: Diagnosis not present

## 2023-12-21 DIAGNOSIS — O0990 Supervision of high risk pregnancy, unspecified, unspecified trimester: Secondary | ICD-10-CM

## 2023-12-21 LAB — CBC
HCT: 31.5 % — ABNORMAL LOW (ref 36.0–46.0)
Hemoglobin: 9.9 g/dL — ABNORMAL LOW (ref 12.0–15.0)
MCH: 25.4 pg — ABNORMAL LOW (ref 26.0–34.0)
MCHC: 31.4 g/dL (ref 30.0–36.0)
MCV: 80.8 fL (ref 80.0–100.0)
Platelets: 242 K/uL (ref 150–400)
RBC: 3.9 MIL/uL (ref 3.87–5.11)
RDW: 14.2 % (ref 11.5–15.5)
WBC: 5.9 K/uL (ref 4.0–10.5)
nRBC: 0 % (ref 0.0–0.2)

## 2023-12-21 LAB — COMPREHENSIVE METABOLIC PANEL WITH GFR
ALT: 11 U/L (ref 0–44)
AST: 17 U/L (ref 15–41)
Albumin: 2.3 g/dL — ABNORMAL LOW (ref 3.5–5.0)
Alkaline Phosphatase: 136 U/L — ABNORMAL HIGH (ref 38–126)
Anion gap: 9 (ref 5–15)
BUN: 6 mg/dL (ref 6–20)
CO2: 20 mmol/L — ABNORMAL LOW (ref 22–32)
Calcium: 8.8 mg/dL — ABNORMAL LOW (ref 8.9–10.3)
Chloride: 105 mmol/L (ref 98–111)
Creatinine, Ser: 0.75 mg/dL (ref 0.44–1.00)
GFR, Estimated: >60 mL/min (ref >60)
Glucose, Bld: 114 mg/dL — ABNORMAL HIGH (ref 70–99)
Potassium: 3.6 mmol/L (ref 3.5–5.1)
Sodium: 134 mmol/L — ABNORMAL LOW (ref 135–145)
Total Bilirubin: 0.5 mg/dL (ref 0.0–1.2)
Total Protein: 6.2 g/dL — ABNORMAL LOW (ref 6.5–8.1)

## 2023-12-21 LAB — PROTEIN / CREATININE RATIO, URINE
Creatinine, Urine: 158 mg/dL
Protein Creatinine Ratio: 0.11 mg/mg{creat} (ref 0.00–0.15)
Total Protein, Urine: 18 mg/dL

## 2023-12-21 MED ORDER — PRENATE PIXIE 10-0.6-0.4-200 MG PO CAPS: 1.0000 | ORAL_CAPSULE | Freq: Every day | ORAL | 11 refills | Status: DC

## 2023-12-21 MED ORDER — FERROUS SULFATE 325 (65 FE) MG PO TABS: ORAL_TABLET | ORAL | 0 refills | Status: DC

## 2023-12-21 MED ORDER — ACETAMINOPHEN-CAFFEINE 500-65 MG PO TABS
2.0000 | ORAL_TABLET | Freq: Once | ORAL | Status: AC
  Administered 2023-12-21: 2 via ORAL
  Filled 2023-12-21: qty 2

## 2023-12-21 MED ORDER — CYCLOBENZAPRINE HCL 5 MG PO TABS: 10.0000 mg | ORAL_TABLET | Freq: Once | ORAL | Status: DC

## 2023-12-21 MED ORDER — CYCLOBENZAPRINE HCL 10 MG PO TABS: 10.0000 mg | ORAL_TABLET | Freq: Two times a day (BID) | ORAL | 0 refills | Status: DC | PRN

## 2023-12-21 MED ORDER — FERROUS SULFATE 325 (65 FE) MG PO TABS: 325.0000 mg | ORAL_TABLET | Freq: Every day | ORAL | 0 refills | Status: DC

## 2023-12-21 NOTE — MAU Provider Note (Signed)
 Chief Complaint:  Headache, Back Pain, and Leg Pain   HPI   None     Bonnie Booth is a 23 y.o. [id number] at [redacted]w[redacted]d who presents to maternity admissions reporting headache, back and leg pain, blurry vision. Symptoms started this morning. She denies contractions, abdominal pain, leaking of fluid, vaginal bleeding, chest pain. Endorses good fetal movement. Positive history of preeclampsia in prior pregnancy. No elevated BP this pregnancy thus far.  Pregnancy Course: Receives care at [facility] for [facility] . Prenatal records reviewed.   Past Medical History:  Diagnosis Date   Acute appendicitis 06/20/2020   Adverse food reaction 01/17/2013   Formatting of this note might be different from the original.  IMPRESSION: keep appt for allergist, has zyrtec  at home to use prn. avoid tomatoes. labs given to take to allergist. has epi pen  Formatting of this note might be different from the original.  Overview:   IMPRESSION: keep appt for allergist, has zyrtec  at home to use prn. avoid tomatoes. labs given to take to allergist. 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   Supervision of other normal pregnancy, antepartum 11/04/2018              Nursing Staff    Provider      Office Location     [facility]    Dating     8 wk US       Language     English    Anatomy US       normal female      Flu Vaccine     02/02/2019    Genetic Screen     NIPS: Low risk Female   AFP:  Negative          TDaP vaccine      03/15/2019    Hgb A1C or    GTT    Early   Third trimester       Rhogam     O positive          LAB RESULTS       Feeding Plan    Brea   OB History  Gravida Para Term Preterm AB Living  4 1 1  2 1   SAB IAB Ectopic Multiple Live Births  2 0 0 0 1    # Outcome Date GA Lbr Len/2nd Weight Sex Type Anes PTL Lv  4 Current           3 Term 05/19/19 [redacted]w[redacted]d 02:33 / 02:22 4099 g M Vag-Spont EPI  LIV     Birth Comments: WDL  2 SAB           1 SAB            Past Surgical History:  Procedure Laterality Date   APPENDECTOMY  05/2020   LAPAROSCOPIC APPENDECTOMY N/A 06/21/2020   Procedure: APPENDECTOMY LAPAROSCOPIC;  Surgeon: Lyndel Deward PARAS, MD;  Location: [facility];  Service: General;  Laterality: N/A;  Family History  Problem Relation Age of Onset   Healthy Son    Social History   Tobacco Use   Smoking status: Never   Smokeless tobacco: Never  Vaping Use   Vaping status: Never Used  Substance Use Topics   Alcohol use: No   Drug use: Not Currently   Allergies  Allergen Reactions   Apple Anaphylaxis and Swelling    Patient's throat swells   Apple Juice Anaphylaxis and Swelling    Patient's throat swells   Bee Venom Anaphylaxis and Swelling    Patient's throat swells   Cinnamon Anaphylaxis and Swelling    Patient's throat swells   Mango Flavor [Flavoring Agent (Non-Screening)] Anaphylaxis and Swelling    Patient's throat swells   Pineapple Anaphylaxis and Swelling    Patient's throat swells   Prunus Persica Anaphylaxis, Rash and Swelling   Tomato Anaphylaxis and Nausea And Vomiting   Peach Flavoring Agent (Non-Screening)    Medications Prior to Admission  Medication Sig Dispense Refill Last Dose/Taking   acetaminophen  (TYLENOL ) 500 MG tablet Take 2 tablets (1,000 mg total) by mouth every 8 (eight) hours as needed for headache. 100 tablet 0 Past Week   metroNIDAZOLE  (METROGEL ) 0.75 % vaginal gel Place 1 Applicatorful vaginally at bedtime. Apply one applicatorful to vagina at bedtime for 5 days 70 g 1  Past Month   [DISCONTINUED] Prenat-FeAsp-Meth-FA-DHA w/o A (PRENATE PIXIE ) 10-0.6-0.4-200 MG CAPS Take 1 tablet by mouth daily. 30 capsule 11 Past Week   polyethylene glycol powder (GLYCOLAX /MIRALAX ) 17 GM/SCOOP powder Take 17 g by mouth daily as needed for moderate constipation. (Patient not taking: Reported on 07/16/2023) 255 g 0    senna (SENOKOT) 8.6 MG TABS tablet Take 2 tablets (17.2 mg total) by mouth daily as needed for mild constipation. (Patient not taking: Reported on 07/16/2023) 120 tablet 0     I have reviewed patient's Past Medical Hx, Surgical Hx, Family Hx, Social Hx, medications and allergies.   ROS  Pertinent items noted in HPI and remainder of comprehensive ROS otherwise negative.   PHYSICAL EXAM  Patient Vitals for the past 24 hrs:  BP Temp Temp src Pulse SpO2 Height Weight  12/21/23 1915 (!) 122/56 -- -- 95 99 % -- --  12/21/23 1905 132/73 -- -- (!) 108 99 % -- --  12/21/23 1847 123/61 -- -- 100 97 % -- --  12/21/23 1819 132/73 99.2 F (37.3 C) Oral (!) 102 97 % 5' 7 (1.702 m) 99 kg    Constitutional: Well-developed, well-nourished female in no acute distress.  HEENT: atraumatic, normocephalic. Neck has normal ROM. EOM intact. Cardiovascular: normal rate & rhythm, warm and well-perfused Respiratory: normal effort, no problems with respiration noted GI: Abd soft, non-tender, non-distended MSK: Extremities nontender, no edema, normal ROM Skin: warm and dry. Acyanotic, no jaundice or pallor. Neurologic: Alert and oriented x 4. No abnormal coordination. Psychiatric: Normal mood. Speech not slurred, not rapid/pressured. Patient is cooperative. GU: no CVA tenderness Pelvic: not indicated  Fetal Tracing: Baseline FHR: 135 per minute Fetal heart variability: moderate Fetal Heart Rate accelerations: yes Fetal Heart Rate decelerations: none Fetal Non-stress Test: Category I (reactive) Toco: uterine irritability, no regular uterine contractions   Labs: Results for  orders placed or performed during the hospital encounter of 12/21/23 (from the past 24 hours)  CBC     Status: Abnormal   Collection Time: 12/21/23  6:11 PM  Result Value Ref Range   WBC 5.9 4.0 - 10.5 K/uL   RBC  3.90 3.87 - 5.11 MIL/uL   Hemoglobin 9.9 (L) 12.0 - 15.0 g/dL   HCT 68.4 (L) 63.9 - 53.9 %   MCV 80.8 80.0 - 100.0 fL   MCH 25.4 (L) 26.0 - 34.0 pg   MCHC 31.4 30.0 - 36.0 g/dL   RDW 85.7 88.4 - 84.4 %   Platelets 242 150 - 400 K/uL   nRBC 0.0 0.0 - 0.2 %  Comprehensive metabolic panel with GFR     Status: Abnormal   Collection Time: 12/21/23  6:11 PM  Result Value Ref Range   Sodium 134 (L) 135 - 145 mmol/L   Potassium 3.6 3.5 - 5.1 mmol/L   Chloride 105 98 - 111 mmol/L   CO2 20 (L) 22 - 32 mmol/L   Glucose, Bld 114 (H) 70 - 99 mg/dL   BUN 6 6 - 20 mg/dL   Creatinine, Ser 9.24 0.44 - 1.00 mg/dL   Calcium 8.8 (L) 8.9 - 10.3 mg/dL   Total Protein 6.2 (L) 6.5 - 8.1 g/dL   Albumin 2.3 (L) 3.5 - 5.0 g/dL   AST 17 15 - 41 U/L   ALT 11 0 - 44 U/L   Alkaline Phosphatase 136 (H) 38 - 126 U/L   Total Bilirubin 0.5 0.0 - 1.2 mg/dL   GFR, Estimated >39 >39 mL/min   Anion gap 9 5 - 15  Protein / creatinine ratio, urine     Status: None   Collection Time: 12/21/23  6:28 PM  Result Value Ref Range   Creatinine, Urine 158 mg/dL   Total Protein, Urine 18 mg/dL   Protein Creatinine Ratio 0.11 0.00 - 0.15 mg/mg[Cre]    Imaging:  No results found.  MDM & MAU COURSE  MDM: High  MAU Course: -Vital signs within normal limits. BP normotensive. -CMP, CBC, urine protein/creatinine ratio to rule out preeclampsia.  -CBC shows worsening anemia. Patient states she ran out of prenatal vitamins. Refill sent with iron supplement. -CMP within normal limits for pregnancy. -urine protein/creatinine ratio within normal limits. -On reassessment, headache improved.  Differential diagnosis considered for headache includes but is not limited to: preeclampsia, tension headache, cluster,  trauma, migraine, viral syndrome  Orders Placed This Encounter  Procedures   CBC   Comprehensive metabolic panel with GFR   Protein / creatinine ratio, urine   Meds ordered this encounter  Medications   acetaminophen -caffeine  (EXCEDRIN TENSION HEADACHE) 500-65 MG per tablet 2 tablet   DISCONTD: ferrous sulfate  325 (65 FE) MG tablet    Sig: Take 1 tablet (325 mg total) by mouth daily.    Dispense:  30 tablet    Refill:  0   DISCONTD: cyclobenzaprine  (FLEXERIL ) tablet 10 mg   Prenat-FeAsp-Meth-FA-DHA w/o A (PRENATE PIXIE ) 10-0.6-0.4-200 MG CAPS    Sig: Take 1 tablet by mouth daily.    Dispense:  30 capsule    Refill:  11   ferrous sulfate  325 (65 FE) MG tablet    Sig: Take 1 tablet (325 mg total) by mouth with breakfast on Monday, Wednesday, and Friday.    Dispense:  30 tablet    Refill:  0    ASSESSMENT   1. Acute nonintractable headache, unspecified headache type   2. Supervision of high risk pregnancy, antepartum   3. Iron deficiency anemia, unspecified iron deficiency anemia type   4. [redacted] weeks gestation of pregnancy     PLAN  Discharge home in stable condition with preeclampsia and preterm labor precautions.  Recommend to continue ambulatory blood  pressure monitoring. PO iron supplement for anemia. Can use Excedrin-Tension for headache. Add Flexeril  or Benadryl  if needed.    Follow-up Information     Center for [facility] Follow up.   Specialty: Obstetrics and Gynecology Why: As scheduled for ongoing prenatal care Contact information: [address] [place]  [address] [phone]                 Allergies as of 12/21/2023       Reactions   Apple Anaphylaxis, Swelling   Patient's throat swells   Apple Juice Anaphylaxis, Swelling   Patient's throat swells   Bee Venom Anaphylaxis, Swelling   Patient's throat swells   Cinnamon Anaphylaxis, Swelling   Patient's throat swells   Mango Flavor  [flavoring Agent (non-screening)] Anaphylaxis, Swelling   Patient's throat swells   Pineapple Anaphylaxis, Swelling   Patient's throat swells   Prunus Persica Anaphylaxis, Rash, Swelling   Tomato Anaphylaxis, Nausea And Vomiting   Peach Flavoring Agent (non-screening)         Medication List     TAKE these medications    acetaminophen  500 MG tablet Commonly known as: TYLENOL  Take 2 tablets (1,000 mg total) by mouth every 8 (eight) hours as needed for headache.   ferrous sulfate  325 (65 FE) MG tablet Take 1 tablet (325 mg total) by mouth with breakfast on Monday, Wednesday, and Friday.   metroNIDAZOLE  0.75 % vaginal gel Commonly known as: METROGEL  Place 1 Applicatorful vaginally at bedtime. Apply one applicatorful to vagina at bedtime for 5 days   polyethylene glycol powder 17 GM/SCOOP powder Commonly known as: GLYCOLAX /MIRALAX  Take 17 g by mouth daily as needed for moderate constipation.   Prenate Pixie  10-0.6-0.4-200 MG Caps Take 1 tablet by mouth daily.   senna 8.6 MG Tabs tablet Commonly known as: SENOKOT Take 2 tablets (17.2 mg total) by mouth daily as needed for mild constipation.        Joesph DELENA Sear, PA

## 2023-12-21 NOTE — Discharge Instructions (Signed)
 Reasons to return to MAU at [facility]: Your blood pressure is >160/110. You have a headache that will not go away after having something to eat, something to drink, and Tylenol . You have changes in your vision or upper abdominal pain that does not get better with Tylenol . Less than 36 weeks: Contractions feels like menstrual cramps. You should go to the hospital if you have more than 6 contractions in an hour, even after you have rested and drank at least 16 ounces of water .  More than 36 weeks: You begin to have strong, frequent contractions 5 minutes apart or less, each last 1 minute, these have been going on for 1-2 hours, and you cannot walk or talk during them. Your water  breaks.  Sometimes it is a big gush of fluid. However, many times it may it may be much more subtle. You should go to the hospital if you have a constant leakage of fluid from your vagina, enough to soak a pad when you are walking around.  You have vaginal bleeding.  It is normal to have a small amount of spotting if your cervix was checked. If you have bleeding requiring the use of a pad, go to the hospital. You don't feel your baby moving like normal.  If you think that you baby's movement is decreased, eat a snack and rest on your left side in a quiet room for one hour. If you have not felt the baby move more than 6 times in an hour GO TO THE HOSPITAL.     HEADACHE: If you get a headache at home, I recommend doing the following: 1) Drink 64 oz of water . Make sure you have something to eat. 2) Take 1000mg  tylenol  (2 extra-strength pills). Alternatively, you can take Excedrin-Tension (NOT Excedrin migraine). Look for TENSION headache formulation. The main ingredients should be Acetaminophen  (same as Tylenol ) and Caffeine ; should NOT have Aspirin in the formulation  3) If you need to you can also take Benadryl . This will likely make you very sleepy. Please take a nap as sleep can help headaches go  away.   If your headache does not improve after trying these, please go to the MAU.

## 2023-12-21 NOTE — MAU Note (Signed)
 Bonnie Booth is a 23 y.o. at [redacted]w[redacted]d here in MAU reporting: HA, back pain, lower leg pain, blurred vision and floaters. No c/o reg contractions, srom, VB, chest pain.  +FM, EFM explained and applied to soft non tender abd.   Onset of complaint: this am Pain score: 10/10 back, 8/10 HA Vitals:   12/21/23 1819  BP: 132/73  Pulse: (!) 102  Temp: 99.2 F (37.3 C)  SpO2: 97%     FHT: 132  Lab orders placed from triage: by provider

## 2023-12-22 ENCOUNTER — Telehealth: Payer: Self-pay | Admitting: Family Medicine

## 2023-12-22 DIAGNOSIS — D509 Iron deficiency anemia, unspecified: Secondary | ICD-10-CM | POA: Insufficient documentation

## 2023-12-22 NOTE — Telephone Encounter (Signed)
 Patient referred to infusion pharmacy team for ambulatory infusion of IV iron.  Insurance - [employer] Prepaid Site of care - Site of care: [facility] Dx code - D50.9 IV Iron Therapy - Venofer 200 mg x 5 Infusion appointments - Scheduling team will schedule patient as soon as possible.   Thank you,  Norton Blush, PharmD, BCSCP Pharmacist [facility]

## 2023-12-23 ENCOUNTER — Telehealth: Payer: Self-pay

## 2023-12-23 ENCOUNTER — Other Ambulatory Visit: Payer: Self-pay

## 2023-12-23 ENCOUNTER — Other Ambulatory Visit

## 2023-12-23 DIAGNOSIS — O3660X0 Maternal care for excessive fetal growth, unspecified trimester, not applicable or unspecified: Secondary | ICD-10-CM

## 2023-12-23 DIAGNOSIS — O09293 Supervision of pregnancy with other poor reproductive or obstetric history, third trimester: Secondary | ICD-10-CM

## 2023-12-23 DIAGNOSIS — R768 Other specified abnormal immunological findings in serum: Secondary | ICD-10-CM | POA: Diagnosis not present

## 2023-12-23 DIAGNOSIS — O3663X0 Maternal care for excessive fetal growth, third trimester, not applicable or unspecified: Secondary | ICD-10-CM

## 2023-12-23 DIAGNOSIS — Z3A35 35 weeks gestation of pregnancy: Secondary | ICD-10-CM

## 2023-12-23 DIAGNOSIS — O0993 Supervision of high risk pregnancy, unspecified, third trimester: Secondary | ICD-10-CM

## 2023-12-23 DIAGNOSIS — O0990 Supervision of high risk pregnancy, unspecified, unspecified trimester: Secondary | ICD-10-CM

## 2023-12-23 DIAGNOSIS — O09299 Supervision of pregnancy with other poor reproductive or obstetric history, unspecified trimester: Secondary | ICD-10-CM

## 2023-12-23 NOTE — Telephone Encounter (Signed)
 Joesph, patient will be scheduled as soon as possible.  Auth Submission: NO AUTH NEEDED Site of care: Site of care: [facility] Payer: [employer] medicaid Medication & CPT/J Code(s) submitted: Venofer (Iron Sucrose) J1756 Diagnosis Code:  Route of submission (phone, fax, portal):  Phone # Fax # Auth type: Buy/Bill PB Units/visits requested: 200mg  x 5 doses Reference number:  Approval from: 12/23/23 to 04/23/24

## 2023-12-30 ENCOUNTER — Ambulatory Visit

## 2023-12-30 VITALS — BP 102/65 | HR 87 | Temp 98.6°F | Resp 16 | Ht 67.0 in | Wt 246.8 lb

## 2023-12-30 DIAGNOSIS — D509 Iron deficiency anemia, unspecified: Secondary | ICD-10-CM

## 2023-12-30 MED ORDER — IRON SUCROSE 20 MG/ML IV SOLN
200.0000 mg | Freq: Once | INTRAVENOUS | Status: AC
  Administered 2023-12-30: 200 mg via INTRAVENOUS
  Filled 2023-12-30: qty 10

## 2023-12-30 NOTE — Progress Notes (Signed)
 Diagnosis: Acute Anemia  Provider:  Praveen Mannam MD  Procedure: IV Push  IV Type: Peripheral, IV Location: R Antecubital  Venofer  (Iron  Sucrose), Dose: 200 mg  Post Infusion IV Care: Observation period completed and Peripheral IV Discontinued  Discharge: Condition: Good, Destination: Home . AVS Declined  Performed by:  Abram Sax G Pilkington-Burchett, RN

## 2023-12-31 ENCOUNTER — Other Ambulatory Visit (HOSPITAL_COMMUNITY)
Admission: RE | Admit: 2023-12-31 | Discharge: 2023-12-31 | Disposition: A | Discharge status: Home / Self Care | Source: Ambulatory Visit | Attending: Physician Assistant | Admitting: Physician Assistant

## 2023-12-31 ENCOUNTER — Ambulatory Visit (INDEPENDENT_AMBULATORY_CARE_PROVIDER_SITE_OTHER): Admitting: Physician Assistant

## 2023-12-31 VITALS — BP 131/76 | HR 99 | Wt 247.0 lb

## 2023-12-31 DIAGNOSIS — Z8632 Personal history of gestational diabetes: Secondary | ICD-10-CM

## 2023-12-31 DIAGNOSIS — O3663X0 Maternal care for excessive fetal growth, third trimester, not applicable or unspecified: Secondary | ICD-10-CM

## 2023-12-31 DIAGNOSIS — O0990 Supervision of high risk pregnancy, unspecified, unspecified trimester: Secondary | ICD-10-CM | POA: Diagnosis present

## 2023-12-31 DIAGNOSIS — D509 Iron deficiency anemia, unspecified: Secondary | ICD-10-CM | POA: Diagnosis not present

## 2023-12-31 DIAGNOSIS — Z3A36 36 weeks gestation of pregnancy: Secondary | ICD-10-CM

## 2023-12-31 DIAGNOSIS — O09299 Supervision of pregnancy with other poor reproductive or obstetric history, unspecified trimester: Secondary | ICD-10-CM | POA: Diagnosis not present

## 2023-12-31 NOTE — Progress Notes (Signed)
   PRENATAL VISIT NOTE  Subjective:  Bonnie Booth is a 23 y.o. [phone] at 101w4d being seen today for ongoing prenatal care.  She is currently monitored for the following issues for this high-risk pregnancy and has Alpha thalassemia silent carrier; JIA (juvenile idiopathic arthritis), oligoarthritis, extended (HCC); Positive ANA (antinuclear antibody); Personal history of arthritis; Panic disorder; Supervision of high risk pregnancy, antepartum; Obesity affecting pregnancy, antepartum; History of pre-eclampsia in prior pregnancy, currently pregnant; History of gestational diabetes mellitus (GDM) in prior pregnancy, currently pregnant; and Iron  deficiency anemia, unspecified on their problem list.  Patient reports no complaints.  Contractions: Irritability. Vag. Bleeding: None.  Movement: Present. Denies leaking of fluid.   The following portions of the patient's history were reviewed and updated as appropriate: allergies, current medications, past family history, past medical history, past social history, past surgical history and problem list.   Objective:    Vitals:   12/31/23 1353  BP: 131/76  Pulse: 99  Weight: 247 lb (112 kg)    Fetal Status:  Fetal Heart Rate (bpm): 140 Fundal Height: 39 cm Movement: Present    General: Alert, oriented and cooperative. Patient is in no acute distress.  Skin: Skin is warm and dry. No rash noted.   Cardiovascular: Normal heart rate noted  Respiratory: Normal respiratory effort, no problems with respiration noted  Abdomen: Soft, gravid, appropriate for gestational age.  Pain/Pressure: Present     Pelvic: Cervical exam deferred        Extremities: Normal range of motion.  Edema: Trace (Feet)  Mental Status: Normal mood and affect. Normal behavior. Normal judgment and thought content.   Assessment and Plan:  Pregnancy: G4P1021 at [redacted]w[redacted]d  1. Supervision of high risk pregnancy, antepartum (Primary) Patient doing well, feeling regular fetal movement   BP, FHR appropriate - Cervicovaginal ancillary only( [place]) - Culture, beta strep (group b only) - CBC - RPR - HIV Antibody (routine testing w rflx) - Glucose Tolerance, 2 Hours w/1 Hour; Future  2. [redacted] weeks gestation of pregnancy Anticipatory guidance about next visits/weeks of pregnancy given.   3. Iron  deficiency anemia, unspecified iron  deficiency anemia type 8/25 hgb 9.9 on oral iron    4. History of pre-eclampsia in prior pregnancy, currently pregnant Normotensive on no medication Baseline labs WNL Continue ASA  5. History of gestational diabetes mellitus (GDM) 6. Excessive fetal growth affecting management of pregnancy in third trimester, single or unspecified fetus Patient not seen for routine prenatal visit since April Encouraged to come fasting for GTT as soon as possible 11/30/23 EFW 96%, AFI 23 with f/u US  tomorrow  Preterm labor symptoms and general obstetric precautions including but not limited to vaginal bleeding, contractions, leaking of fluid and fetal movement were reviewed in detail with the patient.  Please refer to After Visit Summary for other counseling recommendations.   Return in about 1 week (around 01/07/2024) for [facility].  Future Appointments  Date Time Provider Department Center  01/01/2024 11:15 AM [facility] PROVIDER 1 [facility]  01/01/2024 11:30 AM [facility] US3 [facility]  01/01/2024  2:00 PM [facility] 1 [facility] None  01/04/2024  9:30 AM [facility] 6 [facility] None  01/06/2024  9:45 AM [facility] 3 [facility] None  01/08/2024 10:30 AM [facility] 3 [facility] None    Jorene FORBES Moats, PA-C

## 2024-01-01 ENCOUNTER — Ambulatory Visit (HOSPITAL_BASED_OUTPATIENT_CLINIC_OR_DEPARTMENT_OTHER)

## 2024-01-01 ENCOUNTER — Ambulatory Visit

## 2024-01-01 ENCOUNTER — Ambulatory Visit: Attending: Obstetrics and Gynecology | Admitting: Obstetrics

## 2024-01-01 VITALS — BP 123/65 | HR 102

## 2024-01-01 DIAGNOSIS — O09293 Supervision of pregnancy with other poor reproductive or obstetric history, third trimester: Secondary | ICD-10-CM

## 2024-01-01 DIAGNOSIS — O3663X0 Maternal care for excessive fetal growth, third trimester, not applicable or unspecified: Secondary | ICD-10-CM | POA: Insufficient documentation

## 2024-01-01 DIAGNOSIS — O3660X0 Maternal care for excessive fetal growth, unspecified trimester, not applicable or unspecified: Secondary | ICD-10-CM

## 2024-01-01 DIAGNOSIS — Z3A36 36 weeks gestation of pregnancy: Secondary | ICD-10-CM

## 2024-01-01 DIAGNOSIS — O2693 Pregnancy related conditions, unspecified, third trimester: Secondary | ICD-10-CM | POA: Diagnosis not present

## 2024-01-01 DIAGNOSIS — O99213 Obesity complicating pregnancy, third trimester: Secondary | ICD-10-CM | POA: Diagnosis present

## 2024-01-01 DIAGNOSIS — E669 Obesity, unspecified: Secondary | ICD-10-CM | POA: Diagnosis not present

## 2024-01-01 DIAGNOSIS — O0933 Supervision of pregnancy with insufficient antenatal care, third trimester: Secondary | ICD-10-CM | POA: Diagnosis not present

## 2024-01-01 DIAGNOSIS — D563 Thalassemia minor: Secondary | ICD-10-CM

## 2024-01-01 DIAGNOSIS — Z362 Encounter for other antenatal screening follow-up: Secondary | ICD-10-CM | POA: Diagnosis not present

## 2024-01-01 DIAGNOSIS — O0990 Supervision of high risk pregnancy, unspecified, unspecified trimester: Secondary | ICD-10-CM

## 2024-01-01 DIAGNOSIS — O99013 Anemia complicating pregnancy, third trimester: Secondary | ICD-10-CM | POA: Diagnosis not present

## 2024-01-01 DIAGNOSIS — Z148 Genetic carrier of other disease: Secondary | ICD-10-CM | POA: Diagnosis not present

## 2024-01-01 DIAGNOSIS — O09299 Supervision of pregnancy with other poor reproductive or obstetric history, unspecified trimester: Secondary | ICD-10-CM

## 2024-01-01 DIAGNOSIS — O99210 Obesity complicating pregnancy, unspecified trimester: Secondary | ICD-10-CM

## 2024-01-01 LAB — CBC
Hematocrit: 31.4 % — ABNORMAL LOW (ref 34.0–46.6)
Hemoglobin: 9.8 g/dL — ABNORMAL LOW (ref 11.1–15.9)
MCH: 25.1 pg — ABNORMAL LOW (ref 26.6–33.0)
MCHC: 31.2 g/dL — ABNORMAL LOW (ref 31.5–35.7)
MCV: 81 fL (ref 79–97)
Platelets: 226 x10E3/uL (ref 150–450)
RBC: 3.9 x10E6/uL (ref 3.77–5.28)
RDW: 14.5 % (ref 11.7–15.4)
WBC: 7.1 x10E3/uL (ref 3.4–10.8)

## 2024-01-01 LAB — CERVICOVAGINAL ANCILLARY ONLY
Bacterial Vaginitis (gardnerella): NEGATIVE
Candida Glabrata: NEGATIVE
Candida Vaginitis: NEGATIVE
Chlamydia: NEGATIVE
Comment: NEGATIVE
Comment: NEGATIVE
Comment: NEGATIVE
Comment: NEGATIVE
Comment: NEGATIVE
Comment: NORMAL
Neisseria Gonorrhea: NEGATIVE
Trichomonas: NEGATIVE

## 2024-01-01 LAB — RPR: RPR Ser Ql: NONREACTIVE

## 2024-01-01 LAB — HIV ANTIBODY (ROUTINE TESTING W REFLEX): HIV Screen 4th Generation wRfx: NONREACTIVE

## 2024-01-01 MED ORDER — IRON SUCROSE 20 MG/ML IV SOLN: 200.0000 mg | Freq: Once | INTRAVENOUS | Status: DC

## 2024-01-01 NOTE — Progress Notes (Signed)
 Patient information  Patient Name: Bonnie Booth  Patient MRN:   [id number]  Referring practice: MFM Referring Provider:  - [facility]  Problem List   Patient Active Problem List   Diagnosis Date Noted   Iron  deficiency anemia, unspecified 12/22/2023   Obesity affecting pregnancy, antepartum 08/27/2023   History of pre-eclampsia in prior pregnancy, currently pregnant 08/27/2023   History of gestational diabetes mellitus (GDM) in prior pregnancy, currently pregnant 08/27/2023   Supervision of high risk pregnancy, antepartum 06/10/2023   JIA (juvenile idiopathic arthritis), oligoarthritis, extended (HCC) 11/22/2020   Positive ANA (antinuclear antibody) 11/22/2020   Alpha thalassemia silent carrier 12/02/2018   Personal history of arthritis 01/17/2013   Panic disorder 01/17/2013   Maternal Fetal medicine Consult  Ameri MALAIYAH Booth is a 23 y.o. [id number] at [redacted]w[redacted]d here for ultrasound and consultation. Nishika S Osmond is doing well today with no acute concerns. Today we focused on the following:   The patient is here for growth ultrasound.  Today the estimated fetal weight is at the 97th percentile.  Due to the LGA fetus I recommend a 39-week delivery.  She reports good fetal movement.  She will discuss induction with her OB provider. The patient had time to ask questions that were answered to her satisfaction.  She verbalized understanding and agrees to proceed with the plan below.   Sonographic findings Single intrauterine pregnancy. Fetal cardiac activity: Observed. Presentation: Cephalic. Interval fetal anatomy appears normal. Fetal biometry shows the estimated fetal weight at the 98 percentile. Amniotic fluid: Within normal limits.  MVP: 8.99 cm. Placenta: Anterior.  There are limitations of prenatal ultrasound such as the inability to detect certain abnormalities due to poor visualization. Various factors such as fetal position, gestational age and maternal body habitus may  increase the difficulty in visualizing the fetal anatomy.    Recommendations -Since the patient does not have diabetes, weekly antenatal testing is not recommended for LGA fetus alone. -39w IOL is recommended due to LGA -No further ultrasounds are recommended at this time based on the current indications. If future indications arise (e.g. size/date discrepancy on fundal height, gestational diabetes or hypertension) and an ultrasound is to be desired at our MFM office, please send a referral.   Review of Systems: A review of systems was performed and was negative except per HPI   Vitals and Physical Exam    01/01/2024   10:58 AM 12/31/2023    1:53 PM 12/30/2023    2:34 PM  Vitals with BMI  Weight  247 lbs   BMI  38.68   Systolic 123 131 897  Diastolic 65 76 65  Pulse 102 99 87    Sitting comfortably on the sonogram table Nonlabored breathing Normal rate and rhythm Abdomen is nontender  Past pregnancies OB History  Gravida Para Term Preterm AB Living  4 1 1  2 1   SAB IAB Ectopic Multiple Live Births  2 0 0 0 1    # Outcome Date GA Lbr Len/2nd Weight Sex Type Anes PTL Lv  4 Current           3 Term 05/19/19 [redacted]w[redacted]d 02:33 / 02:22 9 lb 0.6 oz (4.099 kg) M Vag-Spont EPI  LIV     Birth Comments: WDL  2 SAB           1 SAB              I spent 30 minutes reviewing the patients chart, including labs and images  as well as counseling the patient about her medical conditions. Greater than 50% of the time was spent in direct face-to-face patient counseling.  Delora Smaller  MFM, [facility]   01/01/2024  12:23 PM

## 2024-01-03 LAB — CULTURE, BETA STREP (GROUP B ONLY): Strep Gp B Culture: POSITIVE — AB

## 2024-01-04 ENCOUNTER — Ambulatory Visit (INDEPENDENT_AMBULATORY_CARE_PROVIDER_SITE_OTHER)

## 2024-01-04 ENCOUNTER — Encounter: Payer: Self-pay | Admitting: Physician Assistant

## 2024-01-04 VITALS — BP 114/72 | HR 93 | Temp 97.8°F | Resp 18 | Ht 67.0 in | Wt 248.8 lb

## 2024-01-04 DIAGNOSIS — O3660X0 Maternal care for excessive fetal growth, unspecified trimester, not applicable or unspecified: Secondary | ICD-10-CM | POA: Insufficient documentation

## 2024-01-04 DIAGNOSIS — D509 Iron deficiency anemia, unspecified: Secondary | ICD-10-CM

## 2024-01-04 MED ORDER — IRON SUCROSE 20 MG/ML IV SOLN
200.0000 mg | Freq: Once | INTRAVENOUS | Status: AC
  Administered 2024-01-04: 200 mg via INTRAVENOUS

## 2024-01-04 NOTE — Progress Notes (Signed)
 Diagnosis: Iron Deficiency Anemia  Provider:  Chilton Greathouse MD  Procedure: IV Push  IV Type: Peripheral, IV Location: R Antecubital  Venofer (Iron Sucrose), Dose: 200 mg  Post Infusion IV Care: Observation period completed and Peripheral IV Discontinued  Discharge: Condition: Good, Destination: Home . AVS Declined  Performed by:  Rico Ala, LPN

## 2024-01-06 ENCOUNTER — Ambulatory Visit (INDEPENDENT_AMBULATORY_CARE_PROVIDER_SITE_OTHER)

## 2024-01-06 VITALS — BP 111/71 | HR 89 | Temp 97.8°F | Resp 18 | Ht 67.0 in | Wt 247.6 lb

## 2024-01-06 DIAGNOSIS — D509 Iron deficiency anemia, unspecified: Secondary | ICD-10-CM

## 2024-01-06 MED ORDER — IRON SUCROSE 20 MG/ML IV SOLN
200.0000 mg | Freq: Once | INTRAVENOUS | Status: AC
  Administered 2024-01-06: 200 mg via INTRAVENOUS
  Filled 2024-01-06: qty 10

## 2024-01-06 NOTE — Progress Notes (Signed)
 Diagnosis: Iron  Deficiency Anemia  Provider:  Praveen Mannam MD  Procedure: IV Push  IV Type: Peripheral, IV Location: L Antecubital  Venofer  (Iron  Sucrose), Dose: 200 mg  Post Infusion IV Care: Observation period completed and Peripheral IV Discontinued  Discharge: Condition: Good, Destination: Home . AVS Declined  Performed by:  Maximiano JONELLE Pouch, LPN

## 2024-01-07 ENCOUNTER — Ambulatory Visit (INDEPENDENT_AMBULATORY_CARE_PROVIDER_SITE_OTHER): Payer: Self-pay | Admitting: Obstetrics and Gynecology

## 2024-01-07 ENCOUNTER — Other Ambulatory Visit

## 2024-01-07 ENCOUNTER — Ambulatory Visit: Payer: Self-pay | Admitting: Family Medicine

## 2024-01-07 VITALS — BP 115/63 | HR 94 | Wt 255.0 lb

## 2024-01-07 DIAGNOSIS — O99210 Obesity complicating pregnancy, unspecified trimester: Secondary | ICD-10-CM

## 2024-01-07 DIAGNOSIS — D509 Iron deficiency anemia, unspecified: Secondary | ICD-10-CM | POA: Diagnosis not present

## 2024-01-07 DIAGNOSIS — O3663X0 Maternal care for excessive fetal growth, third trimester, not applicable or unspecified: Secondary | ICD-10-CM

## 2024-01-07 DIAGNOSIS — B951 Streptococcus, group B, as the cause of diseases classified elsewhere: Secondary | ICD-10-CM | POA: Insufficient documentation

## 2024-01-07 DIAGNOSIS — O0990 Supervision of high risk pregnancy, unspecified, unspecified trimester: Secondary | ICD-10-CM | POA: Diagnosis not present

## 2024-01-07 NOTE — Progress Notes (Signed)
   PRENATAL VISIT NOTE  Subjective:  Bonnie Booth is a 23 y.o. [phone] at [redacted]w[redacted]d being seen today for ongoing prenatal care.  She is currently monitored for the following issues for this high-risk pregnancy and has Alpha thalassemia silent carrier; JIA (juvenile idiopathic arthritis), oligoarthritis, extended (HCC); Positive ANA (antinuclear antibody); Personal history of arthritis; Panic disorder; Supervision of high risk pregnancy, antepartum; Obesity affecting pregnancy, antepartum; History of pre-eclampsia in prior pregnancy, currently pregnant; History of gestational diabetes mellitus (GDM) in prior pregnancy, currently pregnant; Iron  deficiency anemia, unspecified; LGA (large for gestational age) fetus affecting management of mother; and Positive GBS test on their problem list.  Patient reports swelling and headaches.  Contractions: Irregular. Vag. Bleeding: None.  Movement: Present. Denies leaking of fluid.   The following portions of the patient's history were reviewed and updated as appropriate: allergies, current medications, past family history, past medical history, past social history, past surgical history and problem list.   Objective:   Vitals:   01/07/24 1118  BP: 115/63  Pulse: 94  Weight: 255 lb (115.7 kg)   Body mass index is 39.94 kg/m. Total weight gain: 55 lb (24.9 kg)   Fetal Status: Fetal Heart Rate (bpm): 155 Fundal Height: 40 cm Movement: Present     General:  Alert, oriented and cooperative. Patient is in no acute distress.  Skin: Skin is warm and dry. No rash noted.   Cardiovascular: Normal heart rate noted  Respiratory: Normal respiratory effort, no problems with respiration noted  Abdomen: Soft, gravid, appropriate for gestational age.  Pain/Pressure: Present     Pelvic: Cervical exam deferred        Extremities: Normal range of motion.  Edema: Mild pitting, slight indentation  Mental Status: Normal mood and affect. Normal behavior. Normal judgment and  thought content.   Assessment and Plan:  Pregnancy: G4P1021 at [redacted]w[redacted]d 1. Supervision of high risk pregnancy, antepartum (Primary) Anticipatory guidance  2. Excessive fetal growth affecting management of pregnancy in third trimester, single or unspecified fetus Last growth 9/5 at 98% with AC > 99%, delivery at 39 weeks recommended per MFM Reviewed risks of shoulder dystocia with LGA fetus including risks of permanent/temporary brachial plexus injury or neurologic injury. Also reviewed option of cesarean section which she states she would like to avoid. Patient amenable to IOL at 39 wks  3. Obesity affecting pregnancy, antepartum, unspecified obesity type   4. Iron  deficiency anemia, unspecified iron  deficiency anemia type Receiving Iron  infusions  5. Positive GBS test Antibiotics in labor  Term labor symptoms and general obstetric precautions including but not limited to vaginal bleeding, contractions, leaking of fluid and fetal movement were reviewed in detail with the patient. Please refer to After Visit Summary for other counseling recommendations.   Return in about 1 week (around 01/14/2024).  Future Appointments  Date Time Provider Department Center  01/07/2024  1:10 PM Abigail, Rollo DASEN, MD [facility] None  01/08/2024 10:30 AM [facility] None    Rollo DASEN Abigail, MD

## 2024-01-08 ENCOUNTER — Ambulatory Visit: Payer: Self-pay | Admitting: Family Medicine

## 2024-01-08 ENCOUNTER — Ambulatory Visit

## 2024-01-08 LAB — GLUCOSE TOLERANCE, 2 HOURS W/ 1HR
Glucose, 1 hour: 172 mg/dL (ref 70–179)
Glucose, 2 hour: 131 mg/dL (ref 70–152)
Glucose, Fasting: 78 mg/dL (ref 70–91)

## 2024-01-08 MED ORDER — IRON SUCROSE 20 MG/ML IV SOLN: 200.0000 mg | Freq: Once | INTRAVENOUS | Status: DC

## 2024-01-13 ENCOUNTER — Encounter (HOSPITAL_COMMUNITY): Payer: Self-pay | Admitting: *Deleted

## 2024-01-13 ENCOUNTER — Telehealth (HOSPITAL_COMMUNITY): Payer: Self-pay | Admitting: *Deleted

## 2024-01-13 NOTE — Telephone Encounter (Signed)
 Preadmission screen

## 2024-01-14 ENCOUNTER — Ambulatory Visit (INDEPENDENT_AMBULATORY_CARE_PROVIDER_SITE_OTHER): Admitting: Physician Assistant

## 2024-01-14 ENCOUNTER — Encounter: Admitting: Obstetrics and Gynecology

## 2024-01-14 VITALS — BP 130/82 | HR 101 | Temp 98.9°F | Wt 252.2 lb

## 2024-01-14 DIAGNOSIS — R0981 Nasal congestion: Secondary | ICD-10-CM

## 2024-01-14 DIAGNOSIS — O09299 Supervision of pregnancy with other poor reproductive or obstetric history, unspecified trimester: Secondary | ICD-10-CM

## 2024-01-14 DIAGNOSIS — Z3A38 38 weeks gestation of pregnancy: Secondary | ICD-10-CM | POA: Diagnosis not present

## 2024-01-14 DIAGNOSIS — O0990 Supervision of high risk pregnancy, unspecified, unspecified trimester: Secondary | ICD-10-CM

## 2024-01-14 DIAGNOSIS — D509 Iron deficiency anemia, unspecified: Secondary | ICD-10-CM

## 2024-01-14 DIAGNOSIS — O0993 Supervision of high risk pregnancy, unspecified, third trimester: Secondary | ICD-10-CM

## 2024-01-14 DIAGNOSIS — O3663X0 Maternal care for excessive fetal growth, third trimester, not applicable or unspecified: Secondary | ICD-10-CM

## 2024-01-14 MED ORDER — FLUTICASONE PROPIONATE 50 MCG/ACT NA SUSP: 2.0000 | Freq: Every day | NASAL | 0 refills | Status: DC

## 2024-01-14 NOTE — Patient Instructions (Signed)
-  Use fluticasone /flonase  nasal spray for symptom relief. You may also use a saline nasal rinse.  -Take tylenol  for any pain or fever.  -If you have symptoms for more than 5 days, or symptoms worsen, please go to urgent care or emergency department.

## 2024-01-14 NOTE — Progress Notes (Signed)
 Mucous d/c and pressure. Asking for cervix check.   Experiencing allergies. Given safe med list. Taking cough drops. Has not taken any tylenol  for head pressure. No vision changes, no dizziness, no SOB.  Swelling in feet, goes down with rest.   BP 144/75 and 130/82.

## 2024-01-14 NOTE — Progress Notes (Signed)
 PRENATAL VISIT NOTE  Subjective:  Bonnie Booth is a 23 y.o. [phone] at [redacted]w[redacted]d being seen today for ongoing prenatal care.  She is currently monitored for the following issues for this high-risk pregnancy and has Alpha thalassemia silent carrier; JIA (juvenile idiopathic arthritis), oligoarthritis, extended (HCC); Positive ANA (antinuclear antibody); Personal history of arthritis; Panic disorder; Supervision of high risk pregnancy, antepartum; Obesity affecting pregnancy, antepartum; History of pre-eclampsia in prior pregnancy, currently pregnant; History of gestational diabetes mellitus (GDM) in prior pregnancy, currently pregnant; Iron  deficiency anemia, unspecified; LGA (large for gestational age) fetus affecting management of mother; and Positive GBS test on their problem list.  Patient reports nasal congestion, rhinorrhea with clear discharge, sneezing, cough, sore throat x 1 day. She endorses facial pressure and denies fever, HA, pain. Does have history of seasonal allergies.    Contractions: Irritability. Vag. Bleeding: None.  Movement: Increased. Denies leaking of fluid.   The following portions of the patient's history were reviewed and updated as appropriate: allergies, current medications, past family history, past medical history, past social history, past surgical history and problem list.   Objective:    Vitals:   01/14/24 0845 01/14/24 0859  BP: (!) 144/75 130/82  Pulse: 99 (!) 101  Temp: 98.9 F (37.2 C)   Weight: 252 lb 3.2 oz (114.4 kg)     Fetal Status:  Fetal Heart Rate (bpm): 144 Fundal Height: 38 cm Movement: Increased    General: Alert, oriented and cooperative. Patient is in no acute distress.  Skin: Skin is warm and dry. No rash noted.   Cardiovascular: Normal heart rate noted  Respiratory: Normal respiratory effort, no problems with respiration noted  Abdomen: Soft, gravid, appropriate for gestational age.  Pain/Pressure: Present     Pelvic: Cervical exam  performed in the presence of a chaperone.1 cm dilation, 80% effacement       Extremities: Normal range of motion.  Edema: Mild pitting, slight indentation  Mental Status: Normal mood and affect. Normal behavior. Normal judgment and thought content.   Assessment and Plan:  Pregnancy: G4P1021 at [redacted]w[redacted]d  1. Supervision of high risk pregnancy, antepartum (Primary) Patient doing well, feeling regular fetal movement    BP, FHR, FH appropriate   2. [redacted] weeks gestation of pregnancy Anticipatory guidance about next visits/weeks of pregnancy given.   3. Excessive fetal growth affecting management of pregnancy in third trimester, single or unspecified fetus 01/01/24 EFW 3710 gm (98 %), AC > 99%  IOL scheduled 01/17/24  4. Iron  deficiency anemia, unspecified iron  deficiency anemia type Receiving iron  infusions  5. History of pre-eclampsia in prior pregnancy, currently pregnant - Protein / creatinine ratio, urine - Comp Met (CMET)  6. Nasal congestion Patient on second day of nasal congestion with accompanying sore throat, cough, sneezing, clear nasal discharge, pressure in paranasal and frontal sinuses. Allergies vs. viral illness, considered acute rhinosinusitis but does not meet criteria at this time. Advised supportive care for now with precautions given for worsening or unimproved symptoms.  - fluticasone  (FLONASE ) 50 MCG/ACT nasal spray; Place 2 sprays into both nostrils daily.  Dispense: 9.9 mL; Refill: 0   Term labor symptoms and general obstetric precautions including but not limited to vaginal bleeding, contractions, leaking of fluid and fetal movement were reviewed in detail with the patient.  Please refer to After Visit Summary for other counseling recommendations.   Return in about 6 weeks (around 02/25/2024) for PP.  Future Appointments  Date Time Provider Department Center  01/17/2024  6:45  AM [facility] SCHED ROOM [facility] None  02/25/2024 10:55 AM Yehoshua Vitelli E, PA-C [facility] None     Yenny Kosa E Zameria Vogl, PA-C

## 2024-01-15 LAB — COMPREHENSIVE METABOLIC PANEL WITH GFR
ALT: 9 IU/L (ref 0–32)
AST: 15 IU/L (ref 0–40)
Albumin: 3.7 g/dL — ABNORMAL LOW (ref 4.0–5.0)
Alkaline Phosphatase: 163 IU/L — ABNORMAL HIGH (ref 41–116)
BUN/Creatinine Ratio: 10 (ref 9–23)
BUN: 7 mg/dL (ref 6–20)
Bilirubin Total: 0.3 mg/dL (ref 0.0–1.2)
CO2: 19 mmol/L — ABNORMAL LOW (ref 20–29)
Calcium: 9.7 mg/dL (ref 8.7–10.2)
Chloride: 102 mmol/L (ref 96–106)
Creatinine, Ser: 0.67 mg/dL (ref 0.57–1.00)
Globulin, Total: 2.6 g/dL (ref 1.5–4.5)
Glucose: 86 mg/dL (ref 70–99)
Potassium: 4.4 mmol/L (ref 3.5–5.2)
Sodium: 137 mmol/L (ref 134–144)
Total Protein: 6.3 g/dL (ref 6.0–8.5)
eGFR: 124 mL/min/1.73 (ref >59)

## 2024-01-15 LAB — PROTEIN / CREATININE RATIO, URINE
Creatinine, Urine: 73.7 mg/dL
Protein, Ur: 30.5 mg/dL
Protein/Creat Ratio: 414 mg/g{creat} — ABNORMAL HIGH (ref 0–200)

## 2024-01-17 ENCOUNTER — Inpatient Hospital Stay (HOSPITAL_COMMUNITY): Admitting: Anesthesiology

## 2024-01-17 ENCOUNTER — Ambulatory Visit: Payer: Self-pay | Admitting: Physician Assistant

## 2024-01-17 ENCOUNTER — Other Ambulatory Visit: Payer: Self-pay

## 2024-01-17 ENCOUNTER — Encounter (HOSPITAL_COMMUNITY): Payer: Self-pay | Admitting: Obstetrics and Gynecology

## 2024-01-17 ENCOUNTER — Inpatient Hospital Stay (HOSPITAL_COMMUNITY): Admission: RE | Admit: 2024-01-17 | Source: Home / Self Care | Admitting: Family Medicine

## 2024-01-17 ENCOUNTER — Inpatient Hospital Stay (HOSPITAL_COMMUNITY): Attending: Family Medicine

## 2024-01-17 ENCOUNTER — Inpatient Hospital Stay (HOSPITAL_COMMUNITY)
Admission: AD | Admit: 2024-01-17 | Discharge: 2024-01-19 | DRG: 806 | Disposition: A | Discharge status: Home / Self Care | Attending: Family Medicine | Admitting: Family Medicine

## 2024-01-17 DIAGNOSIS — O9902 Anemia complicating childbirth: Secondary | ICD-10-CM | POA: Diagnosis present

## 2024-01-17 DIAGNOSIS — O99210 Obesity complicating pregnancy, unspecified trimester: Secondary | ICD-10-CM | POA: Diagnosis present

## 2024-01-17 DIAGNOSIS — O3663X0 Maternal care for excessive fetal growth, third trimester, not applicable or unspecified: Secondary | ICD-10-CM | POA: Diagnosis present

## 2024-01-17 DIAGNOSIS — O99824 Streptococcus B carrier state complicating childbirth: Secondary | ICD-10-CM | POA: Diagnosis present

## 2024-01-17 DIAGNOSIS — F1290 Cannabis use, unspecified, uncomplicated: Secondary | ICD-10-CM | POA: Diagnosis present

## 2024-01-17 DIAGNOSIS — Z3A39 39 weeks gestation of pregnancy: Secondary | ICD-10-CM

## 2024-01-17 DIAGNOSIS — O99324 Drug use complicating childbirth: Secondary | ICD-10-CM | POA: Diagnosis present

## 2024-01-17 DIAGNOSIS — Z30430 Encounter for insertion of intrauterine contraceptive device: Secondary | ICD-10-CM | POA: Diagnosis not present

## 2024-01-17 DIAGNOSIS — Z3490 Encounter for supervision of normal pregnancy, unspecified, unspecified trimester: Principal | ICD-10-CM | POA: Diagnosis present

## 2024-01-17 DIAGNOSIS — O24429 Gestational diabetes mellitus in childbirth, unspecified control: Secondary | ICD-10-CM | POA: Diagnosis present

## 2024-01-17 DIAGNOSIS — B951 Streptococcus, group B, as the cause of diseases classified elsewhere: Secondary | ICD-10-CM | POA: Diagnosis present

## 2024-01-17 DIAGNOSIS — O09299 Supervision of pregnancy with other poor reproductive or obstetric history, unspecified trimester: Secondary | ICD-10-CM

## 2024-01-17 DIAGNOSIS — Z148 Genetic carrier of other disease: Secondary | ICD-10-CM | POA: Diagnosis not present

## 2024-01-17 DIAGNOSIS — O3660X0 Maternal care for excessive fetal growth, unspecified trimester, not applicable or unspecified: Secondary | ICD-10-CM | POA: Diagnosis present

## 2024-01-17 DIAGNOSIS — D509 Iron deficiency anemia, unspecified: Secondary | ICD-10-CM | POA: Diagnosis present

## 2024-01-17 DIAGNOSIS — D563 Thalassemia minor: Secondary | ICD-10-CM | POA: Diagnosis present

## 2024-01-17 DIAGNOSIS — O99820 Streptococcus B carrier state complicating pregnancy: Secondary | ICD-10-CM | POA: Diagnosis not present

## 2024-01-17 LAB — CBC
HCT: 34 % — ABNORMAL LOW (ref 36.0–46.0)
Hemoglobin: 10.7 g/dL — ABNORMAL LOW (ref 12.0–15.0)
MCH: 26 pg (ref 26.0–34.0)
MCHC: 31.5 g/dL (ref 30.0–36.0)
MCV: 82.7 fL (ref 80.0–100.0)
Platelets: 233 K/uL (ref 150–400)
RBC: 4.11 MIL/uL (ref 3.87–5.11)
RDW: 17.5 % — ABNORMAL HIGH (ref 11.5–15.5)
WBC: 5.4 K/uL (ref 4.0–10.5)
nRBC: 0 % (ref 0.0–0.2)

## 2024-01-17 LAB — TYPE AND SCREEN
ABO/RH(D): O POS
Antibody Screen: NEGATIVE

## 2024-01-17 MED ORDER — LACTATED RINGERS IV SOLN: INTRAVENOUS | Status: DC

## 2024-01-17 MED ORDER — MISOPROSTOL 50MCG HALF TABLET: 50.0000 ug | ORAL_TABLET | ORAL | Status: DC | PRN

## 2024-01-17 MED ORDER — FENTANYL CITRATE (PF) 100 MCG/2ML IJ SOLN
100.0000 ug | INTRAMUSCULAR | Status: DC | PRN
  Administered 2024-01-17: 100 ug via INTRAVENOUS
  Filled 2024-01-17: qty 2

## 2024-01-17 MED ORDER — METHYLERGONOVINE MALEATE 0.2 MG/ML IJ SOLN
INTRAMUSCULAR | Status: AC
  Filled 2024-01-17: qty 1

## 2024-01-17 MED ORDER — ONDANSETRON HCL 4 MG/2ML IJ SOLN: 4.0000 mg | Freq: Four times a day (QID) | INTRAMUSCULAR | Status: DC | PRN

## 2024-01-17 MED ORDER — LACTATED RINGERS IV SOLN: 500.0000 mL | INTRAVENOUS | Status: DC | PRN

## 2024-01-17 MED ORDER — PHENYLEPHRINE 80 MCG/ML (10ML) SYRINGE FOR IV PUSH (FOR BLOOD PRESSURE SUPPORT): 80.0000 ug | PREFILLED_SYRINGE | INTRAVENOUS | Status: DC | PRN

## 2024-01-17 MED ORDER — TERBUTALINE SULFATE 1 MG/ML IJ SOLN: 0.2500 mg | Freq: Once | INTRAMUSCULAR | Status: DC | PRN

## 2024-01-17 MED ORDER — LEVONORGESTREL 20 MCG/DAY IU IUD
1.0000 | INTRAUTERINE_SYSTEM | Freq: Once | INTRAUTERINE | Status: AC
  Administered 2024-01-17: 1 via INTRAUTERINE
  Filled 2024-01-17: qty 1

## 2024-01-17 MED ORDER — EPHEDRINE 5 MG/ML INJ: 10.0000 mg | INTRAVENOUS | Status: DC | PRN

## 2024-01-17 MED ORDER — FENTANYL-BUPIVACAINE-NACL 0.5-0.125-0.9 MG/250ML-% EP SOLN
12.0000 mL/h | EPIDURAL | Status: DC | PRN
  Administered 2024-01-17: 12 mL/h via EPIDURAL
  Filled 2024-01-17: qty 250

## 2024-01-17 MED ORDER — MISOPROSTOL 25 MCG QUARTER TABLET
25.0000 ug | ORAL_TABLET | Freq: Once | ORAL | Status: AC
  Administered 2024-01-17: 25 ug via VAGINAL
  Filled 2024-01-17: qty 1

## 2024-01-17 MED ORDER — LIDOCAINE-EPINEPHRINE (PF) 1.5 %-1:200000 IJ SOLN
INTRAMUSCULAR | Status: DC | PRN
  Administered 2024-01-17: 5 mL via EPIDURAL

## 2024-01-17 MED ORDER — SOD CITRATE-CITRIC ACID 500-334 MG/5ML PO SOLN: 30.0000 mL | ORAL | Status: DC | PRN

## 2024-01-17 MED ORDER — LACTATED RINGERS IV SOLN: 500.0000 mL | Freq: Once | INTRAVENOUS | Status: DC

## 2024-01-17 MED ORDER — OXYTOCIN-SODIUM CHLORIDE 30-0.9 UT/500ML-% IV SOLN: 2.5000 [IU]/h | INTRAVENOUS | Status: DC

## 2024-01-17 MED ORDER — LIDOCAINE HCL (PF) 1 % IJ SOLN: 30.0000 mL | INTRAMUSCULAR | Status: DC | PRN

## 2024-01-17 MED ORDER — MISOPROSTOL 25 MCG QUARTER TABLET
25.0000 ug | ORAL_TABLET | Freq: Once | ORAL | Status: AC
  Administered 2024-01-17: 25 ug via ORAL
  Filled 2024-01-17: qty 1

## 2024-01-17 MED ORDER — PENICILLIN G POT IN DEXTROSE 60000 UNIT/ML IV SOLN
3.0000 10*6.[IU] | INTRAVENOUS | Status: DC
  Administered 2024-01-17: 3 10*6.[IU] via INTRAVENOUS
  Filled 2024-01-17: qty 50

## 2024-01-17 MED ORDER — OXYTOCIN-SODIUM CHLORIDE 30-0.9 UT/500ML-% IV SOLN
1.0000 m[IU]/min | INTRAVENOUS | Status: DC
  Filled 2024-01-17: qty 500

## 2024-01-17 MED ORDER — ACETAMINOPHEN 325 MG PO TABS
650.0000 mg | ORAL_TABLET | ORAL | Status: DC | PRN
  Administered 2024-01-17: 650 mg via ORAL
  Filled 2024-01-17: qty 2

## 2024-01-17 MED ORDER — SODIUM CHLORIDE 0.9 % IV SOLN
5.0000 10*6.[IU] | Freq: Once | INTRAVENOUS | Status: AC
  Administered 2024-01-17: 5 10*6.[IU] via INTRAVENOUS
  Filled 2024-01-17: qty 5

## 2024-01-17 MED ORDER — OXYTOCIN BOLUS FROM INFUSION
333.0000 mL | Freq: Once | INTRAVENOUS | Status: AC
  Administered 2024-01-17: 333 mL via INTRAVENOUS

## 2024-01-17 MED ORDER — DIPHENHYDRAMINE HCL 50 MG/ML IJ SOLN: 12.5000 mg | INTRAMUSCULAR | Status: DC | PRN

## 2024-01-17 MED ORDER — LIDOCAINE HCL (PF) 1 % IJ SOLN
INTRAMUSCULAR | Status: DC | PRN
  Administered 2024-01-17: 5 mL via EPIDURAL

## 2024-01-17 NOTE — H&P (Signed)
 OBSTETRIC ADMISSION HISTORY AND PHYSICAL  Bonnie Booth is 23 y.o. [phone] with IUP at [redacted]w[redacted]d 01/24/2024, by Last Menstrual Period presenting for IOL iso GDM. She received her prenatal care at [facility]   ROS (+) FM, ctx rare (-) VB, LOF. HA, visual changes, CP, SOB, RUQ pain, peripheral edema.   Prenatal History/Complications       NURSING  PROVIDER  Office Location [facility] Dating by LMP c/w US  at [redacted]w[redacted]d  [facility] Model Traditional Anatomy U/S    Initiated care at  7wks                 Language  English               LAB RESULTS   Support Person [employer] NIPS: Low-Risk Female AFP:       NT/IT (FT only) N/A      Carrier Screen Horizon:   Rhogam  O/Positive/-- (03/20 1405) A1C/GTT Early HgbA1C: 5.4 Third trimester 2 hr GTT: normal  Flu Vaccine  No      TDaP Vaccine   Blood Type O/Positive/-- (03/20 1405)  RSV Vaccine   Antibody Negative (03/20 1405)  COVID Vaccine  No Rubella 2.24 (03/20 1405) IMMUNE  Feeding Plan breast RPR Non Reactive (09/04 1446)  Contraception condoms HBsAg Negative (03/20 1405)  Circumcision  Yes if female HIV Non Reactive (09/04 1446)  Pediatrician  Santana Molt HCVAb Non Reactive (03/20 1405)  Prenatal Classes        BTL Consent   Pap No results found for: DIAGPAP (wants @ PP)  BTL Pre-payment   GC/CT Initial:  Neg/Neg 36wks:  neg/neg  VBAC Consent   GBS Positive/-- (09/04 1500) For PCN allergy, check sensitivities   BRx Optimized? [ ]  yes   [ ]  no      DME Rx [ ]  BP cuff [ ]  Weight Scale Waterbirth  [ ]  Class [ ]  Consent [ ]  CNM visit  PHQ9 & GAD7 [  ] new OB [  ] 28 weeks  [  ] 36 weeks Induction  [ ]  Orders Entered [ ] Foley Y/N   OB History  Gravida Para Term Preterm AB Living  4 1 1  2 1   SAB IAB Ectopic Multiple Live Births  2 0 0 0 1    # Outcome Date GA Lbr Len/2nd Weight Sex Type Anes PTL Lv  4 Current           3 Term 05/19/19 [redacted]w[redacted]d 02:33 / 02:22 4099 g M Vag-Spont EPI  LIV     Birth Comments: WDL  2 SAB           1 SAB             Patient Active Problem List   Diagnosis Date Noted   Encounter for induction of labor 01/17/2024   History of gestational diabetes in prior pregnancy, currently pregnant 01/17/2024   Positive GBS test 01/07/2024   LGA (large for gestational age) fetus affecting management of mother 01/04/2024   Iron  deficiency anemia, unspecified 12/22/2023   Obesity affecting pregnancy, antepartum 08/27/2023   History of pre-eclampsia in prior pregnancy, currently pregnant 08/27/2023   Supervision of high risk pregnancy, antepartum 06/10/2023   JIA (juvenile idiopathic arthritis), oligoarthritis, extended (HCC) 11/22/2020   Positive ANA (antinuclear antibody) 11/22/2020   Alpha thalassemia silent carrier 12/02/2018   Personal history of arthritis 01/17/2013   Panic disorder 01/17/2013    Past Medical History: Past Medical History:  Diagnosis Date  Acute appendicitis 06/20/2020   Adverse food reaction 01/17/2013   Formatting of this note might be different from the original.  IMPRESSION: keep appt for allergist, has zyrtec  at home to use prn. avoid tomatoes. labs given to take to allergist. has epi pen  Formatting of this note might be different from the original.  Overview:   IMPRESSION: keep appt for allergist, has zyrtec  at home to use prn. avoid tomatoes. labs given to take to allergist. 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   Supervision of other normal pregnancy, antepartum 11/04/2018              Nursing Staff    Provider       Office Location     [facility]    Dating     8 wk US       Language     English    Anatomy US       normal female      Flu Vaccine     02/02/2019    Genetic Screen     NIPS: Low risk Female   AFP:  Negative          TDaP vaccine      03/15/2019    Hgb A1C or   GTT    Early   Third trimester       Rhogam     O positive          LAB RESULTS       Feeding Plan    Brea    Past Surgical History: Past Surgical History:  Procedure Laterality Date   APPENDECTOMY  05/2020   LAPAROSCOPIC APPENDECTOMY N/A 06/21/2020   Procedure: APPENDECTOMY LAPAROSCOPIC;  Surgeon: Lyndel Deward PARAS, MD;  Location: [facility];  Service: General;  Laterality: N/A;    Social History Social History   Socioeconomic History   Marital status: Single    Spouse name: Not on file   Number of children: Not on file   Years of education: Not on file   Highest education level: Not on file  Occupational History   Not on file  Tobacco Use   Smoking status: Never   Smokeless tobacco: Never  Vaping Use   Vaping status: Never Used  Substance and Sexual Activity   Alcohol use: No   Drug use: Not Currently   Sexual activity: Yes    Birth control/protection: None  Other Topics Concern   Not on file  Social History Narrative   Not on file   Social Drivers of Health   Financial Resource Strain: Not on file  Food Insecurity: No Food Insecurity (01/17/2024)   Hunger Vital Sign    Worried About Running Out of Food in the Last Year: Never true    Ran Out of Food in the Last Year: Never true  Transportation Needs: No Transportation Needs (01/17/2024)   PRAPARE - Administrator, Civil Service (Medical): No    Lack of Transportation (Non-Medical): No  Physical Activity: Not on file  Stress: Not on file  Social Connections: Not on file    Family History: Family History  Problem Relation Age of Onset   Healthy Son     Allergies: Allergies  Allergen Reactions   Apple Anaphylaxis and Swelling    Patient's throat  swells   Apple Juice Anaphylaxis and Swelling    Patient's throat swells   Bee Venom Anaphylaxis and Swelling    Patient's throat swells   Cinnamon Anaphylaxis and Swelling    Patient's throat swells   Mango Flavor [Flavoring Agent (Non-Screening)] Anaphylaxis and Swelling    Patient's throat swells   Pineapple Anaphylaxis and Swelling    Patient's throat swells   Prunus Persica Anaphylaxis, Rash and Swelling   Tomato Anaphylaxis and Nausea And Vomiting   Peach Flavoring Agent (Non-Screening)     Medications Prior to Admission  Medication Sig Dispense Refill Last Dose/Taking   acetaminophen  (TYLENOL ) 500 MG tablet Take 2 tablets (1,000 mg total) by mouth every 8 (eight) hours as needed for headache. 100 tablet 0 Past Week   cyclobenzaprine  (FLEXERIL ) 10 MG tablet Take 1 tablet (10 mg total) by mouth 2 (two) times daily as needed for muscle spasms. 20 tablet 0    ferrous sulfate  325 (65 FE) MG tablet Take 1 tablet (325 mg total) by mouth with breakfast on Monday, Wednesday, and Friday. (Patient not taking: Reported on 12/31/2023) 30 tablet 0    fluticasone  (FLONASE ) 50 MCG/ACT nasal spray Place 2 sprays into both nostrils daily. 9.9 mL 0    metroNIDAZOLE  (METROGEL ) 0.75 % vaginal gel Place 1 Applicatorful vaginally at bedtime. Apply one applicatorful to vagina at bedtime for 5 days (Patient not taking: Reported on 01/14/2024) 70 g 1    polyethylene glycol powder (GLYCOLAX /MIRALAX ) 17 GM/SCOOP powder Take 17 g by mouth daily as needed for moderate constipation. (Patient not taking: Reported on 07/16/2023) 255 g 0    Prenat-FeAsp-Meth-FA-DHA w/o A (PRENATE PIXIE ) 10-0.6-0.4-200 MG CAPS Take 1 tablet by mouth daily. (Patient not taking: Reported on 01/14/2024) 30 capsule 11    senna (SENOKOT) 8.6 MG TABS tablet Take 2 tablets (17.2 mg total) by mouth daily as needed for mild constipation. (Patient not taking: Reported on 07/16/2023) 120 tablet 0      Review of Systems  All systems reviewed and  negative except as stated in HPI  PHYSICAL EXAM Blood pressure 130/73, pulse (!) 101, temperature 98.2 F (36.8 C), temperature source Oral, resp. rate 16, height 5' 7 (1.702 m), weight 113.2 kg, last menstrual period 04/19/2023, SpO2 98%. General appearance: alert and cooperative Lungs: respirations nonlabored Heart: regular rate Abdomen: gravid  Fetal monitoringBaseline: 130 bpm, Variability: Good {> 6 bpm), Accelerations: Reactive, and Decelerations: Absent Uterine activityFrequency: rare, q26mi  Dilation: 2 Effacement (%): 50 Station: -3 Exam by:: k fields, rn Presentation: cephalic   Prenatal labs: ABO, Rh: --/--/O POS (09/21 1330) Antibody: NEG (09/21 1330) Rubella: 2.24 (03/20 1405) RPR: Non Reactive (09/04 1446)  HBsAg: Negative (03/20 1405)  HIV: Non Reactive (09/04 1446)   Lab Results  Component Value Date   GBS Positive (A) 12/31/2023    Anatomy US : normal, limited by fetal position  Immunization History  Administered Date(s) Administered   DTaP 06/27/2002, 08/04/2003, 03/12/2004, 03/11/2005   HIB (PRP-OMP) 06/27/2002   HPV Quadrivalent 01/17/2013   Hepatitis A, Ped/Adol-2 Dose 01/17/2013   Hepatitis B, PED/ADOLESCENT 06/27/2002, 08/04/2003, 03/11/2005   IPV 06/27/2002, 08/04/2003, 03/11/2005   Influenza Split 02/02/2019   Influenza,inj,Quad  PF,6+ Mos 02/02/2019   MMR 06/27/2002, 03/11/2005   Meningococcal Conjugate 01/17/2013   Meningococcal polysaccharide vaccine (MPSV4) 01/17/2013   Pneumococcal Conjugate-13 06/27/2002   Pneumococcal-Unspecified 06/27/2002   Tdap 01/17/2013, 03/15/2019   Varicella 01/17/2013    Prenatal Transfer Tool  Maternal Diabetes: No Genetic Screening: Normal Maternal Ultrasounds/Referrals: Normal Fetal Ultrasounds or other Referrals:  None Maternal Substance Abuse:  Yes:  Type: Marijuana Significant Maternal Medications:  None Significant Maternal Lab Results: Group B Strep positive Number of Prenatal Visits:Less  than or equal to 3 verified prenatal visits Maternal Vaccinations: none Other Comments:  None   Results for orders placed or performed during the hospital encounter of 01/17/24 (from the past 24 hours)  CBC   Collection Time: 01/17/24  1:30 PM  Result Value Ref Range   WBC 5.4 4.0 - 10.5 K/uL   RBC 4.11 3.87 - 5.11 MIL/uL   Hemoglobin 10.7 (L) 12.0 - 15.0 g/dL   HCT 65.9 (L) 63.9 - 53.9 %   MCV 82.7 80.0 - 100.0 fL   MCH 26.0 26.0 - 34.0 pg   MCHC 31.5 30.0 - 36.0 g/dL   RDW 82.4 (H) 88.4 - 84.4 %   Platelets 233 150 - 400 K/uL   nRBC 0.0 0.0 - 0.2 %  Type and screen [facility]   Collection Time: 01/17/24  1:30 PM  Result Value Ref Range   ABO/RH(D) O POS    Antibody Screen NEG    Sample Expiration      01/20/2024,2359 Performed at [facility], [address], [place], [place] [address]     Patient Active Problem List   Diagnosis Date Noted   Encounter for induction of labor 01/17/2024   History of gestational diabetes in prior pregnancy, currently pregnant 01/17/2024   Positive GBS test 01/07/2024   LGA (large for gestational age) fetus affecting management of mother 01/04/2024   Iron  deficiency anemia, unspecified 12/22/2023   Obesity affecting pregnancy, antepartum 08/27/2023   History of pre-eclampsia in prior pregnancy, currently pregnant 08/27/2023   Supervision of high risk pregnancy, antepartum 06/10/2023   JIA (juvenile idiopathic arthritis), oligoarthritis, extended (HCC) 11/22/2020   Positive ANA (antinuclear antibody) 11/22/2020   Alpha thalassemia silent carrier 12/02/2018   Personal history of arthritis 01/17/2013   Panic disorder 01/17/2013    ASSESSMENT & PLAN Bonnie Booth is 23 y.o. [id number] with IUP at [redacted]w[redacted]d 01/24/2024, by Last Menstrual Period admitted for IOL LGA.  Sono at 36+5: normal anatomy, cephalic presentation, anterior placenta, EFW 3710g, (98%)  #Labor: IOL #Pain: epidural PRN #FWB: Cat I  #LGA #h/o  macrosomia G3, 2021: vagdel 4099g, gDM, delivery uncomplicated - gUS@36 +5: 98% as documented above  #IDA Hgb on admit 10.7 - continue po iron  q2d after delivery  #GBS status:  positive #Feeding: Breastmilk  #Reproductive Life planning: IUD Mirena  #Circ:  yes   Barabara Maier, DO FMOB Fellow, Editor, commissioning, Center for [employer]

## 2024-01-17 NOTE — Anesthesia Procedure Notes (Signed)
 Epidural Patient location during procedure: OB Start time: 01/17/2024 9:37 PM End time: 01/17/2024 9:47 PM  Staffing Anesthesiologist: Patrisha Bernardino SQUIBB, MD Performed: anesthesiologist   Preanesthetic Checklist Completed: patient identified, IV checked, site marked, risks and benefits discussed, monitors and equipment checked, pre-op evaluation and timeout performed  Epidural Patient position: sitting Prep: DuraPrep Patient monitoring: heart rate, cardiac monitor, continuous pulse ox and blood pressure Approach: midline Location: L4-L5 Injection technique: LOR air  Needle:  Needle type: Tuohy  Needle gauge: 17 G Needle length: 9 cm Needle insertion depth: 9 cm Catheter type: closed end flexible Catheter size: 19 Gauge Catheter at skin depth: 15 cm Test dose: negative and 1.5% lidocaine  with Epi 1:200 K  Assessment Events: blood not aspirated, no cerebrospinal fluid, injection not painful, no injection resistance and negative IV test  Additional Notes Informed consent obtained prior to proceeding including risk of failure, 1% risk of PDPH, risk of minor discomfort and bruising.  Discussed alternatives to epidural analgesia and patient desires to proceed.  Timeout performed pre-procedure verifying patient name, procedure, and platelet count.  Loss of resistance encountered on the second attempt. Patient tolerated procedure well. Reason for block:procedure for pain

## 2024-01-17 NOTE — Anesthesia Preprocedure Evaluation (Signed)
 Anesthesia Evaluation  Patient identified by MRN, date of birth, ID band Patient awake    Reviewed: Allergy & Precautions, H&P , NPO status , Patient's Chart, lab work & pertinent test results  History of Anesthesia Complications Negative for: history of anesthetic complications  Airway Mallampati: II       Dental no notable dental hx.    Pulmonary neg pulmonary ROS   Pulmonary exam normal        Cardiovascular hypertension, negative cardio ROS Normal cardiovascular exam     Neuro/Psych  PSYCHIATRIC DISORDERS Anxiety     negative neurological ROS     GI/Hepatic negative GI ROS, Neg liver ROS,,,  Endo/Other  negative endocrine ROS    Renal/GU negative Renal ROS     Musculoskeletal   Abdominal  (+) + obese  Peds  Hematology  (+) Blood dyscrasia, anemia   Anesthesia Other Findings   Reproductive/Obstetrics (+) Pregnancy                              Anesthesia Physical Anesthesia Plan  ASA: 2  Anesthesia Plan: Epidural   Post-op Pain Management:    Induction:   PONV Risk Score and Plan:   Airway Management Planned:   Additional Equipment:   Intra-op Plan:   Post-operative Plan:   Informed Consent: I have reviewed the patients History and Physical, chart, labs and discussed the procedure including the risks, benefits and alternatives for the proposed anesthesia with the patient or authorized representative who has indicated his/her understanding and acceptance.       Plan Discussed with:   Anesthesia Plan Comments:         Anesthesia Quick Evaluation

## 2024-01-17 NOTE — MAU Note (Addendum)
 [name] is a 23 y.o. at [redacted]w[redacted]d here in MAU reporting: started contracting last night.  Were really bad, hurt so much she threw up. Has also been feeling SOB and dizzy. (Pt was breathing fast when called back , encouraged her to slow her breathing- this is probably why she feels dizzy). Has had a really bad HA since yesterday.  Didn't take anything. ( States she had one a couple wks ago, so she took the ASA they had given her.  Explained the ASA is to be taken daily for prevention of Pre-E, stated she thought it was as needed). No bleeding or LOF.  Reports +FM. Is for induction today.   Onset of complaint: yesterday Pain score: HA moderate, ctx mod/low back Vitals:   01/17/24 1118  BP: 131/69  Pulse: (!) 114  Resp: 20  Temp: 98.8 F (37.1 C)  SpO2: 98%     FHT:152 Lab orders placed from triage:  urine collected

## 2024-01-17 NOTE — Discharge Summary (Signed)
 Postpartum Discharge Summary  Date of Service updated***     Patient Name: Bonnie Booth DOB: Sep 12, 2000 MRN: [id number]  Date of admission: 01/17/2024 Delivery date:01/17/2024 Delivering provider: TRUDY CZAR B Date of discharge: 01/17/2024  Admitting diagnosis: Encounter for induction of labor [Z34.90] Intrauterine pregnancy: [redacted]w[redacted]d     Secondary diagnosis:  Active Problems:   Alpha thalassemia silent carrier   Iron  deficiency anemia, unspecified   SVD (spontaneous vaginal delivery)  Additional problems: Hx of pre-eclampsia and GDM in prior pregnancy    Discharge diagnosis: Term Pregnancy Delivered                                              Post partum procedures:Post-placental Mirena  insertion Augmentation: AROM and Cytotec  Complications: None  Hospital course: Induction of Labor With Vaginal Delivery   23 y.o. yo [phone] at [redacted]w[redacted]d was admitted to the hospital 01/17/2024 for induction of labor.  Indication for induction: LGA, hx of pre-eclampsia in prior pregnancy.  Patient had an uncomplicated labor course. Membrane Rupture Time/Date: 7:45 PM,01/17/2024  Delivery Method:Vaginal, Spontaneous Operative Delivery:N/A Episiotomy: None Lacerations:   left labial, first degree perineal Details of delivery can be found in separate delivery note.  Patient had a postpartum course complicated by***. Patient is discharged home 01/17/24.  Newborn Data: Birth date:01/17/2024 Birth time:10:32 PM Gender:Female Living status:  Apgars: ,  Weight:   Magnesium Sulfate received: No BMZ received: No Rhophylac:N/A MMR:N/A T-DaP:offered prior to discharge Flu: No RSV Vaccine received: No Transfusion:{Transfusion received:30440034}  Immunizations received: Immunization History  Administered Date(s) Administered   DTaP 06/27/2002, 08/04/2003, 03/12/2004, 03/11/2005   HIB (PRP-OMP) 06/27/2002   HPV Quadrivalent 01/17/2013   Hepatitis A, Ped/Adol-2 Dose 01/17/2013   Hepatitis B,  PED/ADOLESCENT 06/27/2002, 08/04/2003, 03/11/2005   IPV 06/27/2002, 08/04/2003, 03/11/2005   Influenza Split 02/02/2019   Influenza,inj,Quad PF,6+ Mos 02/02/2019   MMR 06/27/2002, 03/11/2005   Meningococcal Conjugate 01/17/2013   Meningococcal polysaccharide vaccine (MPSV4) 01/17/2013   Pneumococcal Conjugate-13 06/27/2002   Pneumococcal-Unspecified 06/27/2002   Tdap 01/17/2013, 03/15/2019   Varicella 01/17/2013    Physical exam  Vitals:   01/17/24 1945 01/17/24 2147 01/17/24 2158 01/17/24 2200  BP: 120/83 (!) 148/81 132/64 (!) 132/59  Pulse: 92 95 83 85  Resp: 16     Temp: 98.4 F (36.9 C)     TempSrc: Oral     SpO2:      Weight:      Height:       General: {Exam; general:21111117} Lochia: {Desc; appropriate/inappropriate:30686::appropriate} Uterine Fundus: {Desc; firm/soft:30687} Incision: {Exam; incision:21111123} DVT Evaluation: {Exam; dvt:2111122} Labs: Lab Results  Component Value Date   WBC 5.4 01/17/2024   HGB 10.7 (L) 01/17/2024   HCT 34.0 (L) 01/17/2024   MCV 82.7 01/17/2024   PLT 233 01/17/2024      Latest Ref Rng & Units 01/14/2024    9:33 AM  CMP  Glucose 70 - 99 mg/dL 86   BUN 6 - 20 mg/dL 7   Creatinine 9.42 - 8.99 mg/dL 9.32   Sodium 865 - 855 mmol/L 137   Potassium 3.5 - 5.2 mmol/L 4.4   Chloride 96 - 106 mmol/L 102   CO2 20 - 29 mmol/L 19   Calcium 8.7 - 10.2 mg/dL 9.7   Total Protein 6.0 - 8.5 g/dL 6.3   Total Bilirubin 0.0 - 1.2 mg/dL 0.3  Alkaline Phos 41 - 116 IU/L 163   AST 0 - 40 IU/L 15   ALT 0 - 32 IU/L 9    Edinburgh Score:    05/21/2019    7:00 AM  Edinburgh Postnatal Depression Scale Screening Tool  I have been able to laugh and see the funny side of things. 0  I have looked forward with enjoyment to things. 0  I have blamed myself unnecessarily when things went wrong. 1  I have been anxious or worried for no good reason. 0  I have felt scared or panicky for no good reason. 0  Things have been getting on top of me. 0   I have been so unhappy that I have had difficulty sleeping. 0  I have felt sad or miserable. 0  I have been so unhappy that I have been crying. 0  The thought of harming myself has occurred to me. 0  Edinburgh Postnatal Depression Scale Total 1      Data saved with a previous flowsheet row definition   No data recorded  After visit meds:  Allergies as of 01/17/2024       Reactions   Apple Anaphylaxis, Swelling   Patient's throat swells   Apple Juice Anaphylaxis, Swelling   Patient's throat swells   Bee Venom Anaphylaxis, Swelling   Patient's throat swells   Cinnamon Anaphylaxis, Swelling   Patient's throat swells   Mango Flavor [flavoring Agent (non-screening)] Anaphylaxis, Swelling   Patient's throat swells   Pineapple Anaphylaxis, Swelling   Patient's throat swells   Prunus Persica Anaphylaxis, Rash, Swelling   Tomato Anaphylaxis, Nausea And Vomiting   Peach Flavoring Agent (non-screening)      Med Rec must be completed prior to using this [facility]***        Discharge home in stable condition Infant Feeding: Breast Infant Disposition:home with mother Discharge instruction: per After Visit Summary and Postpartum booklet. Activity: Advance as tolerated. Pelvic rest for 6 weeks.  Diet: routine diet Future Appointments: Future Appointments  Date Time Provider Department Center  02/25/2024 10:55 AM Davis, Devon E, PA-C [facility] None   Follow up Visit:  Message sent to [facility] 9/21  Please schedule this patient for a In person postpartum visit in 4-6 weeks with the following provider: Any provider. Additional Postpartum F/U:none  High risk pregnancy complicated by: LGA, hx of GDM and pre-eclampsia in prior pregnancy Delivery mode:  Vaginal, Spontaneous Anticipated Birth Control:  PP IUD placed (Mirena )   01/17/2024 Leeroy KATHEE Pouch, MD

## 2024-01-17 NOTE — Progress Notes (Signed)
 LABOR PROGRESS NOTE Pt rechecked at 1730 Patient comfortable on labor ball. Pit at none. SCE: 4/50/-3, fetal head well applied AROM cf  FHT: baseline 140, mod variability, +accels, -decels; overall category I. Toco: q2 min  A/P:  Monitor for , augment with pitocin  PRN Continue movement/ambulation Epidural PRN  Bonnie Maier, DO 8:23 PM

## 2024-01-18 ENCOUNTER — Encounter (HOSPITAL_COMMUNITY): Payer: Self-pay | Admitting: Obstetrics and Gynecology

## 2024-01-18 LAB — RPR: RPR Ser Ql: NONREACTIVE

## 2024-01-18 MED ORDER — OXYCODONE HCL 5 MG PO TABS: 5.0000 mg | ORAL_TABLET | ORAL | Status: DC | PRN

## 2024-01-18 MED ORDER — TETANUS-DIPHTH-ACELL PERTUSSIS 5-2.5-18.5 LF-MCG/0.5 IM SUSY: 0.5000 mL | PREFILLED_SYRINGE | Freq: Once | INTRAMUSCULAR | Status: DC

## 2024-01-18 MED ORDER — IBUPROFEN 600 MG PO TABS
600.0000 mg | ORAL_TABLET | Freq: Four times a day (QID) | ORAL | Status: DC
  Administered 2024-01-18 – 2024-01-19 (×5): 600 mg via ORAL
  Filled 2024-01-18 (×7): qty 1

## 2024-01-18 MED ORDER — ZOLPIDEM TARTRATE 5 MG PO TABS: 5.0000 mg | ORAL_TABLET | Freq: Every evening | ORAL | Status: DC | PRN

## 2024-01-18 MED ORDER — ONDANSETRON HCL 4 MG/2ML IJ SOLN: 4.0000 mg | INTRAMUSCULAR | Status: DC | PRN

## 2024-01-18 MED ORDER — BENZOCAINE-MENTHOL 20-0.5 % EX AERO
1.0000 | INHALATION_SPRAY | CUTANEOUS | Status: DC | PRN
  Administered 2024-01-18 (×2): 1 via TOPICAL
  Filled 2024-01-18 (×2): qty 56

## 2024-01-18 MED ORDER — COCONUT OIL OIL
1.0000 | TOPICAL_OIL | Status: DC | PRN
  Administered 2024-01-19: 1 via TOPICAL

## 2024-01-18 MED ORDER — ONDANSETRON HCL 4 MG PO TABS: 4.0000 mg | ORAL_TABLET | ORAL | Status: DC | PRN

## 2024-01-18 MED ORDER — PRENATAL MULTIVITAMIN CH
1.0000 | ORAL_TABLET | Freq: Every day | ORAL | Status: DC
  Administered 2024-01-18 – 2024-01-19 (×2): 1 via ORAL
  Filled 2024-01-18 (×2): qty 1

## 2024-01-18 MED ORDER — OXYCODONE HCL 5 MG PO TABS: 10.0000 mg | ORAL_TABLET | ORAL | Status: DC | PRN

## 2024-01-18 MED ORDER — DIPHENHYDRAMINE HCL 25 MG PO CAPS: 25.0000 mg | ORAL_CAPSULE | Freq: Four times a day (QID) | ORAL | Status: DC | PRN

## 2024-01-18 MED ORDER — ACETAMINOPHEN 325 MG PO TABS: 650.0000 mg | ORAL_TABLET | ORAL | Status: DC | PRN

## 2024-01-18 MED ORDER — SENNOSIDES-DOCUSATE SODIUM 8.6-50 MG PO TABS
2.0000 | ORAL_TABLET | Freq: Every day | ORAL | Status: DC
  Administered 2024-01-18 – 2024-01-19 (×2): 2 via ORAL
  Filled 2024-01-18 (×2): qty 2

## 2024-01-18 MED ORDER — SIMETHICONE 80 MG PO CHEW: 80.0000 mg | CHEWABLE_TABLET | ORAL | Status: DC | PRN

## 2024-01-18 MED ORDER — DIBUCAINE (PERIANAL) 1 % EX OINT: 1.0000 | TOPICAL_OINTMENT | CUTANEOUS | Status: DC | PRN

## 2024-01-18 MED ORDER — WITCH HAZEL-GLYCERIN EX PADS: 1.0000 | MEDICATED_PAD | CUTANEOUS | Status: DC | PRN

## 2024-01-18 NOTE — Anesthesia Postprocedure Evaluation (Signed)
 Anesthesia Post Note  Patient: Bonnie Booth  Procedure(s) Performed: AN AD HOC LABOR EPIDURAL     Patient location during evaluation: Mother Baby Anesthesia Type: Epidural Level of consciousness: awake and alert Pain management: pain level controlled Vital Signs Assessment: post-procedure vital signs reviewed and stable Respiratory status: spontaneous breathing, nonlabored ventilation and respiratory function stable Cardiovascular status: stable Postop Assessment: no headache, no backache and epidural receding Anesthetic complications: no   No notable events documented.  Last Vitals:  Vitals:   01/18/24 0227 01/18/24 0618  BP: 117/60 122/60  Pulse: 84 77  Resp: 18 18  Temp: 37 C 36.7 C  SpO2: 99% 99%    Last Pain:  Vitals:   01/18/24 0904  TempSrc:   PainSc: 5    Pain Goal:                   [name]

## 2024-01-18 NOTE — Lactation Note (Signed)
 This note was copied from a baby's chart. Lactation Consultation Note  Patient Name: Bonnie Booth Unijb'd Date: 01/18/2024 Age:25 hours Reason for consult: Initial assessment  P2, Mother is experienced with breastfeeding. She is breastfeeding and supplementing with formula. Encouraged offering the breast first to help establish her milk supply.   Feed on demand with cues.  Goal 8-12+ times per day after first 24 hrs.  Place baby STS if not cueing.  Provided mother with manual pump fitted with 18 mm flange. Reviewed hand expression bilaterally with drops expressed.   Maternal Data Has patient been taught Hand Expression?: Yes Does the patient have breastfeeding experience prior to this delivery?: Yes How long did the patient breastfeed?: 2 years  Feeding Mother's Current Feeding Choice: Breast Milk and Formula  Lactation Tools Discussed/Used Tools: Pump;Flanges Flange Size: 18 Breast pump type: Manual Pump Education: Setup, frequency, and cleaning;Milk Storage Reason for Pumping: stimulation Pumping frequency: PRN  Interventions Interventions: Breast feeding basics reviewed;Education;Hand pump;LC Services brochure;CDC milk storage guidelines  Discharge Pump: Manual;Advised to call insurance company  Consult Status Consult Status: Follow-up Date: 01/19/24 Follow-up type: In-patient   Shannon Levorn Lemme  RN, IBCLC 01/18/2024, 8:43 AM

## 2024-01-18 NOTE — Progress Notes (Signed)
 POSTPARTUM PROGRESS NOTE  Post Partum Day 1  Subjective:  Bonnie Booth is a 23 y.o. [id number] s/p SVD at [redacted]w[redacted]d.  She reports she is doing well. No acute events overnight. She denies any problems with ambulating, voiding or po intake. Denies nausea or vomiting.  Pain is well controlled.  Lochia is Normal.  Objective: Blood pressure 117/60, pulse 84, temperature 98.6 F (37 C), temperature source Oral, resp. rate 18, height 5' 7 (1.702 m), weight 113.2 kg, last menstrual period 04/19/2023, SpO2 99%, unknown if currently breastfeeding.  BP Readings from Last 3 Encounters:  01/18/24 117/60  01/14/24 130/82  01/07/24 115/63    Physical Exam:  General: alert, cooperative and no distress Chest: no respiratory distress Heart:regular rate, distal pulses intact Uterine Fundus: firm, appropriately tender DVT Evaluation: No calf swelling or tenderness Extremities: none edema Skin: warm, dry  Recent Labs    01/17/24 1330  HGB 10.7*  HCT 34.0*    Assessment/Plan: Bonnie Booth is a 23 y.o. [id number] s/p NSVD at [redacted]w[redacted]d   PPD# 1 - Doing well  Routine postpartum care  Delivery Complications: none Blood Pressure: normal Anemia/Hb Status: appropriate admission Hb without severe blood loss at delivery, no intervention indicated Contraception: Post-Placental IUD Mirena  Feeding: breast feeding   Dispo: Plan for discharge tomorrow.   LOS: 1 day   Leeroy KATHEE Pouch, MD OB Fellow  01/18/2024, 3:32 AM

## 2024-01-18 NOTE — Progress Notes (Signed)
 MOB was referred for history of anxiety/panic attacks.  * Referral screened out by Clinical Social Worker because none of the following criteria appear to apply:  ~ History of anxiety/depression during this pregnancy, or of post-partum depression following prior delivery.  ~ Diagnosis of anxiety/Panic attacks within last 3 years  Per OB notes, MOB did not indicate any signs/symptoms during her pregnancy.  Panic attacks 2014  OR  * MOB's symptoms currently being treated with medication and/or therapy. Please contact the Clinical Social Worker if needs arise, by [facility] request, or if MOB scores greater than 9/yes to question 10 on Edinburgh Postpartum Depression Screen.  Bonnie Booth, Bonnie Booth Clinical Social Worker [phone]

## 2024-01-19 MED ORDER — IBUPROFEN 600 MG PO TABS: 600.0000 mg | ORAL_TABLET | Freq: Four times a day (QID) | ORAL | 0 refills | Status: AC

## 2024-01-19 MED ORDER — SENNOSIDES-DOCUSATE SODIUM 8.6-50 MG PO TABS: 2.0000 | ORAL_TABLET | Freq: Every day | ORAL | 0 refills | Status: AC

## 2024-01-19 MED ORDER — PRENATAL MULTIVITAMIN CH: 1.0000 | ORAL_TABLET | Freq: Every day | ORAL | 5 refills | Status: AC

## 2024-01-19 MED ORDER — ACETAMINOPHEN 325 MG PO TABS: 650.0000 mg | ORAL_TABLET | ORAL | Status: AC | PRN

## 2024-01-19 NOTE — Lactation Note (Signed)
 This note was copied from a baby's chart. Lactation Consultation Note  Patient Name: Bonnie Booth Date: 01/19/2024 Age:25 hours Reason for consult: Follow-up assessment;Term;Infant weight loss (4 % weight loss) Mom updated the  LC with the last feeding. Last fed at 8 am.  Per mom plan is to breast and formula.  LC reviewed supply and demand , importance of baby practice at the breast to establish her milk supply.  LC reviewed engorgement prevention and tx. Mom aware of LC resources.  Maternal Data Has patient been taught Hand Expression?: Yes Does the patient have breastfeeding experience prior to this delivery?: Yes  Feeding Mother's Current Feeding Choice: Breast Milk and Formula Nipple Type: Slow - flow    Lactation Tools Discussed/Used Tools: Pump Flange Size: 18 Breast pump type: Manual Pump Education: Setup, frequency, and cleaning;Milk Storage  Interventions  Education  Discharge Discharge Education: Engorgement and breast care;Warning signs for feeding baby;Other (comment) (mom declined LC O/P apt,) Pump: Manual;Advised to call insurance company  Consult Status Consult Status: Complete Date: 01/19/24    Rollene Jenkins Fiedler 01/19/2024, 9:10 AM

## 2024-01-19 NOTE — Patient Instructions (Signed)
 [facility] Lactation Support Group  Please join us  for our [facility] for [employer] Lactation Support Group at [employer] We meet every Tuesday at 10:00 am to 12:00 pm at [employer] on the second floor in the conference room Lactating parents and lap babies are welcome, no registration is required, if you have a lactation pillow please bring     If interested in an outpatient lactation consult in office or virtually please reach out to us  at [employer] [facility] (First Floor) [address] [place] Please call [phone] and press 4 for lactation.    Lactation support groups:  [facility], Tuesdays 10:00 am -12:00 pm at [address] on the second floor in the conference room, lactating parents and lap babies welcome.  [email or web address]  [email or web address]  Bonnie Booth, [facility] for [facility]

## 2024-01-28 ENCOUNTER — Telehealth (HOSPITAL_COMMUNITY): Payer: Self-pay | Admitting: *Deleted

## 2024-01-28 NOTE — Telephone Encounter (Signed)
 01/28/2024  Name: Bonnie Booth MRN: [id number] DOB: March 05, 2001  Reason for Call:  Transition of Care Hospital Discharge Call  Contact Status: Patient Contact Status: Unable to contact (call cannot be completed, unable to leave a message)  Language assistant needed:          Follow-Up Questions:    Van Postnatal Depression Scale:  In the Past 7 Days:    PHQ2-9 Depression Scale:     Discharge Follow-up:    Post-discharge interventions: NA  Mliss Sieve, RN 01/28/2024 11:07

## 2024-02-01 ENCOUNTER — Ambulatory Visit (INDEPENDENT_AMBULATORY_CARE_PROVIDER_SITE_OTHER)

## 2024-02-01 DIAGNOSIS — Z0130 Encounter for examination of blood pressure without abnormal findings: Secondary | ICD-10-CM | POA: Diagnosis not present

## 2024-02-01 NOTE — Progress Notes (Signed)
 Subjective:  Bonnie Booth is a [id number] here for BP check.  She is 2 weeks postpartum following a normal spontaneous vaginal delivery.    Hypertension ROS: Patient has headache and visual changes. Pt also complains of dizziness and feeling tired. Wants to know if she should start PO iron . Pt also requests blood glucose checked. Blood glucose 135, pt had just ate a meal.    Objective:  BP 127/73   Pulse 76   LMP 04/19/2023 (Approximate)   Breastfeeding Yes   Appearance alert, well appearing, and in no distress.  Assessment:   Blood Pressure well controlled. Consulted with Dr. Alger. Blood pressure in office today is normal. With symptoms, pt should report to MAU if no improvement, pt should also continue to monitor BP and report to MAU if seeing elevated readings with these symptoms. Pt should continue PNV which has iron  in them. Blood glucose WNL.   Plan:  Orders and follow up as documented in patient record..   Bonnie Zabinski N Karnell Vanderloop, RN

## 2024-02-25 ENCOUNTER — Ambulatory Visit: Admitting: Physician Assistant
